# Patient Record
Sex: Male | Born: 1940
Health system: Southern US, Community
[De-identification: ages and names within clinical notes are randomized; demographics above are authoritative.]

## PROBLEM LIST (undated history)

## (undated) DIAGNOSIS — H9191 Unspecified hearing loss, right ear: Secondary | ICD-10-CM

## (undated) DIAGNOSIS — N200 Calculus of kidney: Secondary | ICD-10-CM

## (undated) DIAGNOSIS — E785 Hyperlipidemia, unspecified: Secondary | ICD-10-CM

## (undated) DIAGNOSIS — I10 Essential (primary) hypertension: Secondary | ICD-10-CM

## (undated) DIAGNOSIS — E7800 Pure hypercholesterolemia, unspecified: Secondary | ICD-10-CM

## (undated) HISTORY — PX: APPENDECTOMY: SHX54

## (undated) HISTORY — PX: DENTAL SURGERY: SHX609

## (undated) HISTORY — PX: CYST EXCISION: SHX5701

## (undated) HISTORY — PX: CYSTOSCOPY: SUR368

---
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
## 2009-07-03 ENCOUNTER — Encounter: Admission: RE | Admit: 2009-07-03 | Discharge: 2009-07-03 | Payer: Self-pay | Admitting: Otolaryngology

## 2010-04-04 ENCOUNTER — Encounter: Payer: Self-pay | Admitting: Otolaryngology

## 2014-12-17 DIAGNOSIS — L820 Inflamed seborrheic keratosis: Secondary | ICD-10-CM | POA: Diagnosis not present

## 2014-12-17 DIAGNOSIS — B078 Other viral warts: Secondary | ICD-10-CM | POA: Diagnosis not present

## 2014-12-17 DIAGNOSIS — L821 Other seborrheic keratosis: Secondary | ICD-10-CM | POA: Diagnosis not present

## 2014-12-17 DIAGNOSIS — D485 Neoplasm of uncertain behavior of skin: Secondary | ICD-10-CM | POA: Diagnosis not present

## 2014-12-17 DIAGNOSIS — D239 Other benign neoplasm of skin, unspecified: Secondary | ICD-10-CM | POA: Diagnosis not present

## 2015-01-05 DIAGNOSIS — Z23 Encounter for immunization: Secondary | ICD-10-CM | POA: Diagnosis not present

## 2015-04-09 DIAGNOSIS — B349 Viral infection, unspecified: Secondary | ICD-10-CM | POA: Diagnosis not present

## 2015-04-09 DIAGNOSIS — R0982 Postnasal drip: Secondary | ICD-10-CM | POA: Diagnosis not present

## 2015-04-14 DIAGNOSIS — J310 Chronic rhinitis: Secondary | ICD-10-CM | POA: Diagnosis not present

## 2015-04-14 DIAGNOSIS — J069 Acute upper respiratory infection, unspecified: Secondary | ICD-10-CM | POA: Diagnosis not present

## 2015-04-14 DIAGNOSIS — R05 Cough: Secondary | ICD-10-CM | POA: Diagnosis not present

## 2015-04-22 DIAGNOSIS — Z0000 Encounter for general adult medical examination without abnormal findings: Secondary | ICD-10-CM | POA: Diagnosis not present

## 2015-04-22 DIAGNOSIS — N200 Calculus of kidney: Secondary | ICD-10-CM | POA: Diagnosis not present

## 2015-04-22 DIAGNOSIS — I10 Essential (primary) hypertension: Secondary | ICD-10-CM | POA: Diagnosis not present

## 2015-04-22 DIAGNOSIS — E782 Mixed hyperlipidemia: Secondary | ICD-10-CM | POA: Diagnosis not present

## 2015-05-01 DIAGNOSIS — H6122 Impacted cerumen, left ear: Secondary | ICD-10-CM | POA: Diagnosis not present

## 2015-05-01 DIAGNOSIS — N200 Calculus of kidney: Secondary | ICD-10-CM | POA: Diagnosis not present

## 2015-05-01 DIAGNOSIS — E782 Mixed hyperlipidemia: Secondary | ICD-10-CM | POA: Diagnosis not present

## 2015-05-01 DIAGNOSIS — I10 Essential (primary) hypertension: Secondary | ICD-10-CM | POA: Diagnosis not present

## 2015-05-29 DIAGNOSIS — E782 Mixed hyperlipidemia: Secondary | ICD-10-CM | POA: Diagnosis not present

## 2015-05-29 DIAGNOSIS — I10 Essential (primary) hypertension: Secondary | ICD-10-CM | POA: Diagnosis not present

## 2015-05-29 DIAGNOSIS — R413 Other amnesia: Secondary | ICD-10-CM | POA: Diagnosis not present

## 2015-11-12 DIAGNOSIS — Z23 Encounter for immunization: Secondary | ICD-10-CM | POA: Diagnosis not present

## 2015-12-08 DIAGNOSIS — H2513 Age-related nuclear cataract, bilateral: Secondary | ICD-10-CM | POA: Diagnosis not present

## 2015-12-29 DIAGNOSIS — E782 Mixed hyperlipidemia: Secondary | ICD-10-CM | POA: Diagnosis not present

## 2015-12-29 DIAGNOSIS — N200 Calculus of kidney: Secondary | ICD-10-CM | POA: Diagnosis not present

## 2015-12-29 DIAGNOSIS — I10 Essential (primary) hypertension: Secondary | ICD-10-CM | POA: Diagnosis not present

## 2015-12-29 DIAGNOSIS — R55 Syncope and collapse: Secondary | ICD-10-CM | POA: Diagnosis not present

## 2016-01-01 DIAGNOSIS — I10 Essential (primary) hypertension: Secondary | ICD-10-CM | POA: Diagnosis not present

## 2016-01-01 DIAGNOSIS — R55 Syncope and collapse: Secondary | ICD-10-CM | POA: Diagnosis not present

## 2016-01-01 DIAGNOSIS — E782 Mixed hyperlipidemia: Secondary | ICD-10-CM | POA: Diagnosis not present

## 2016-01-08 DIAGNOSIS — E782 Mixed hyperlipidemia: Secondary | ICD-10-CM | POA: Diagnosis not present

## 2016-01-08 DIAGNOSIS — N401 Enlarged prostate with lower urinary tract symptoms: Secondary | ICD-10-CM | POA: Diagnosis not present

## 2016-01-08 DIAGNOSIS — M542 Cervicalgia: Secondary | ICD-10-CM | POA: Diagnosis not present

## 2016-01-08 DIAGNOSIS — M545 Low back pain: Secondary | ICD-10-CM | POA: Diagnosis not present

## 2016-06-24 DIAGNOSIS — N200 Calculus of kidney: Secondary | ICD-10-CM | POA: Diagnosis not present

## 2016-06-24 DIAGNOSIS — I10 Essential (primary) hypertension: Secondary | ICD-10-CM | POA: Diagnosis not present

## 2016-06-24 DIAGNOSIS — Z0000 Encounter for general adult medical examination without abnormal findings: Secondary | ICD-10-CM | POA: Diagnosis not present

## 2016-06-24 DIAGNOSIS — E782 Mixed hyperlipidemia: Secondary | ICD-10-CM | POA: Diagnosis not present

## 2016-06-24 DIAGNOSIS — Z125 Encounter for screening for malignant neoplasm of prostate: Secondary | ICD-10-CM | POA: Diagnosis not present

## 2016-07-01 DIAGNOSIS — B370 Candidal stomatitis: Secondary | ICD-10-CM | POA: Diagnosis not present

## 2016-07-01 DIAGNOSIS — E782 Mixed hyperlipidemia: Secondary | ICD-10-CM | POA: Diagnosis not present

## 2016-07-01 DIAGNOSIS — N401 Enlarged prostate with lower urinary tract symptoms: Secondary | ICD-10-CM | POA: Diagnosis not present

## 2016-07-01 DIAGNOSIS — Z23 Encounter for immunization: Secondary | ICD-10-CM | POA: Diagnosis not present

## 2016-07-01 DIAGNOSIS — N200 Calculus of kidney: Secondary | ICD-10-CM | POA: Diagnosis not present

## 2016-08-09 DIAGNOSIS — D126 Benign neoplasm of colon, unspecified: Secondary | ICD-10-CM | POA: Diagnosis not present

## 2016-08-09 DIAGNOSIS — K5730 Diverticulosis of large intestine without perforation or abscess without bleeding: Secondary | ICD-10-CM | POA: Diagnosis not present

## 2016-08-09 DIAGNOSIS — Z86010 Personal history of colonic polyps: Secondary | ICD-10-CM | POA: Diagnosis not present

## 2016-08-11 DIAGNOSIS — D126 Benign neoplasm of colon, unspecified: Secondary | ICD-10-CM | POA: Diagnosis not present

## 2016-12-09 DIAGNOSIS — Z23 Encounter for immunization: Secondary | ICD-10-CM | POA: Diagnosis not present

## 2017-01-23 DIAGNOSIS — I10 Essential (primary) hypertension: Secondary | ICD-10-CM | POA: Diagnosis not present

## 2017-01-23 DIAGNOSIS — E782 Mixed hyperlipidemia: Secondary | ICD-10-CM | POA: Diagnosis not present

## 2017-01-30 DIAGNOSIS — I10 Essential (primary) hypertension: Secondary | ICD-10-CM | POA: Diagnosis not present

## 2017-01-30 DIAGNOSIS — E782 Mixed hyperlipidemia: Secondary | ICD-10-CM | POA: Diagnosis not present

## 2017-01-30 DIAGNOSIS — N200 Calculus of kidney: Secondary | ICD-10-CM | POA: Diagnosis not present

## 2017-01-30 DIAGNOSIS — N401 Enlarged prostate with lower urinary tract symptoms: Secondary | ICD-10-CM | POA: Diagnosis not present

## 2017-02-26 ENCOUNTER — Encounter (HOSPITAL_COMMUNITY): Payer: Self-pay | Admitting: *Deleted

## 2017-02-26 ENCOUNTER — Inpatient Hospital Stay (HOSPITAL_COMMUNITY)
Admission: EM | Admit: 2017-02-26 | Discharge: 2017-03-01 | DRG: 603 | Disposition: A | Discharge status: Home / Self Care | Payer: Medicare HMO | Attending: Internal Medicine | Admitting: Internal Medicine

## 2017-02-26 ENCOUNTER — Other Ambulatory Visit: Payer: Self-pay

## 2017-02-26 ENCOUNTER — Emergency Department (HOSPITAL_COMMUNITY): Payer: Medicare HMO

## 2017-02-26 DIAGNOSIS — L03113 Cellulitis of right upper limb: Principal | ICD-10-CM | POA: Diagnosis present

## 2017-02-26 DIAGNOSIS — Z79899 Other long term (current) drug therapy: Secondary | ICD-10-CM | POA: Diagnosis not present

## 2017-02-26 DIAGNOSIS — R197 Diarrhea, unspecified: Secondary | ICD-10-CM | POA: Diagnosis not present

## 2017-02-26 DIAGNOSIS — W5501XA Bitten by cat, initial encounter: Secondary | ICD-10-CM

## 2017-02-26 DIAGNOSIS — L0390 Cellulitis, unspecified: Secondary | ICD-10-CM | POA: Diagnosis present

## 2017-02-26 DIAGNOSIS — D696 Thrombocytopenia, unspecified: Secondary | ICD-10-CM | POA: Diagnosis present

## 2017-02-26 DIAGNOSIS — I10 Essential (primary) hypertension: Secondary | ICD-10-CM | POA: Diagnosis not present

## 2017-02-26 DIAGNOSIS — E876 Hypokalemia: Secondary | ICD-10-CM | POA: Diagnosis present

## 2017-02-26 DIAGNOSIS — Z23 Encounter for immunization: Secondary | ICD-10-CM | POA: Diagnosis not present

## 2017-02-26 DIAGNOSIS — S61431A Puncture wound without foreign body of right hand, initial encounter: Secondary | ICD-10-CM | POA: Diagnosis not present

## 2017-02-26 DIAGNOSIS — S61451A Open bite of right hand, initial encounter: Secondary | ICD-10-CM | POA: Diagnosis not present

## 2017-02-26 DIAGNOSIS — E7800 Pure hypercholesterolemia, unspecified: Secondary | ICD-10-CM | POA: Diagnosis present

## 2017-02-26 DIAGNOSIS — S61401A Unspecified open wound of right hand, initial encounter: Secondary | ICD-10-CM | POA: Diagnosis not present

## 2017-02-26 DIAGNOSIS — Z792 Long term (current) use of antibiotics: Secondary | ICD-10-CM | POA: Diagnosis not present

## 2017-02-26 HISTORY — DX: Essential (primary) hypertension: I10

## 2017-02-26 HISTORY — DX: Pure hypercholesterolemia, unspecified: E78.00

## 2017-02-26 LAB — CBC WITH DIFFERENTIAL/PLATELET
BASOS ABS: 0 10*3/uL (ref 0.0–0.1)
BASOS PCT: 0 %
EOS ABS: 0.1 10*3/uL (ref 0.0–0.7)
EOS PCT: 1 %
HCT: 44.3 % (ref 39.0–52.0)
Hemoglobin: 15.4 g/dL (ref 13.0–17.0)
Lymphocytes Relative: 29 %
Lymphs Abs: 3.1 10*3/uL (ref 0.7–4.0)
MCH: 33.1 pg (ref 26.0–34.0)
MCHC: 34.8 g/dL (ref 30.0–36.0)
MCV: 95.3 fL (ref 78.0–100.0)
MONO ABS: 0.6 10*3/uL (ref 0.1–1.0)
MONOS PCT: 6 %
Neutro Abs: 7 10*3/uL (ref 1.7–7.7)
Neutrophils Relative %: 64 %
PLATELETS: 124 10*3/uL — AB (ref 150–400)
RBC: 4.65 MIL/uL (ref 4.22–5.81)
RDW: 13.9 % (ref 11.5–15.5)
WBC: 10.8 10*3/uL — ABNORMAL HIGH (ref 4.0–10.5)

## 2017-02-26 LAB — COMPREHENSIVE METABOLIC PANEL
ALBUMIN: 3.6 g/dL (ref 3.5–5.0)
ALK PHOS: 52 U/L (ref 38–126)
ALT: 29 U/L (ref 17–63)
ANION GAP: 10 (ref 5–15)
AST: 33 U/L (ref 15–41)
BILIRUBIN TOTAL: 0.5 mg/dL (ref 0.3–1.2)
BUN: 11 mg/dL (ref 6–20)
CALCIUM: 8.8 mg/dL — AB (ref 8.9–10.3)
CO2: 21 mmol/L — AB (ref 22–32)
CREATININE: 0.91 mg/dL (ref 0.61–1.24)
Chloride: 106 mmol/L (ref 101–111)
GFR calc Af Amer: >60 mL/min (ref >60)
GFR calc non Af Amer: >60 mL/min (ref >60)
GLUCOSE: 142 mg/dL — AB (ref 65–99)
Potassium: 3.2 mmol/L — ABNORMAL LOW (ref 3.5–5.1)
SODIUM: 137 mmol/L (ref 135–145)
TOTAL PROTEIN: 5.7 g/dL — AB (ref 6.5–8.1)

## 2017-02-26 LAB — I-STAT CG4 LACTIC ACID, ED: Lactic Acid, Venous: 1.36 mmol/L (ref 0.5–1.9)

## 2017-02-26 MED ORDER — POTASSIUM CHLORIDE 20 MEQ/15ML (10%) PO SOLN
40.0000 meq | Freq: Once | ORAL | Status: DC
  Filled 2017-02-26: qty 30

## 2017-02-26 MED ORDER — TETANUS-DIPHTH-ACELL PERTUSSIS 5-2.5-18.5 LF-MCG/0.5 IM SUSP
0.5000 mL | Freq: Once | INTRAMUSCULAR | Status: AC
  Administered 2017-02-27: 0.5 mL via INTRAMUSCULAR
  Filled 2017-02-26: qty 0.5

## 2017-02-26 MED ORDER — PIPERACILLIN-TAZOBACTAM 3.375 G IVPB 30 MIN
3.3750 g | Freq: Once | INTRAVENOUS | Status: AC
  Administered 2017-02-27: 3.375 g via INTRAVENOUS
  Filled 2017-02-26: qty 50

## 2017-02-26 MED ORDER — VANCOMYCIN HCL IN DEXTROSE 1-5 GM/200ML-% IV SOLN
1000.0000 mg | Freq: Once | INTRAVENOUS | Status: AC
  Administered 2017-02-27: 1000 mg via INTRAVENOUS
  Filled 2017-02-26: qty 200

## 2017-02-26 NOTE — ED Triage Notes (Signed)
Pt reports cat bite to right hand two days ago. Had redness to hand and [facility] to [facility] this [facility]. Was given ceftriaxaone 250mg  IM and given prescription. Reports redness has since increased and spread further down right hand since this afternoon.

## 2017-02-26 NOTE — Progress Notes (Addendum)
Pharmacy Antibiotic Note  William Black is a 76 y.o. male admitted on 02/26/2017 with persistent wound infection from cat bite.  Pharmacy has been consulted for vancomycin and zosyn dosing.  WBC 10.8, LA 1.36, and afebrile. SCr 0.91 for estimated CrCl ~ 65 mL/min.   Vancomcyin 1g IV x1 and Zosyn 3.375g IV x1 given in ED.   Plan: Vancomycin 750mg  IV q12hr  Zosyn 3.375g IV q8hr  Vancomycin trough at SS and PRN (goal ~ 15 mcg/mL) Monitor renal function, clinical picture, and culture data  F/u de-escalation and length of therapy   Height: 5\' 8"  (172.7 cm) Weight: 143 lb (64.9 kg) IBW/kg (Calculated) : 68.4  Temp (24hrs), Avg:98 F (36.7 C), Min:98 F (36.7 C), Max:98 F (36.7 C)  Recent Labs  Lab 02/26/17 1909 02/26/17 1923  WBC 10.8*  --   CREATININE 0.91  --   LATICACIDVEN  --  1.36    Estimated Creatinine Clearance: 65.4 mL/min (by C-G formula based on SCr of 0.91 mg/dL).    No Known Allergies  Antimicrobials this admission: 12/16 Vanc >>  12/16 Zosyn >>   Lavonda Jumbo, PharmD Clinical Pharmacist 02/26/17 11:18 PM

## 2017-02-26 NOTE — H&P (Addendum)
History and Physical   William Black LOV:[id number] DOB: [birth year] DOA: 02/26/2017  PCP: [name], MD  Chief Complaint: cat bite  HPI:  This is a 76 year old current smoker and history of hypertension presenting to emergency department with right hand pain. He reports 2-3 days ago his cat bit his right dorsal hand in the morning. He reports no symptoms until the morning of admission where he noticed he was having some pain in his right hand, progressively throughout the day it became more red and painful. He sought care local urgent care where he received IM ceftriaxone and given a prescription for Augmentin. He denies any fevers, chills, shortness of breath, chest pain, nausea, vomiting.  He reports his cat is an Occupational psychologist. He reports that at the request of his daughter and spouse who concern that the redness was becoming worse, he sought care in emergency room. He currently reports no impairment of his range of motion, current pain 5 out of 10, cites pain is worse when the blood pressure cuff inflates more proximally to his arm.  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  ED Course: In emergency department vital signs were remarkable for systolic blood pressures 884Z, normal heart rate. Maintaining O2 sats above 90% on room air. BMP remarkable for hypokalemia to 3.2 and white count 10.8.  Plain film of the right hand found generalized mild diffuse soft tissue swelling without underlying emphysema, no frank obstruction. The emergency medicine team spoke with hand surgery who recommended broad-spectrum antimicrobial coverage and nothing by mouth status in anticipation for evaluation in the a.m. to see if surgery would be indicated. Hospital medicine team consulted for admission.   Review of Systems: A complete ROS was  obtained; pertinent positives negatives are denoted in the HPI. Otherwise, all systems are negative.   Past Medical History:  Diagnosis Date  . High cholesterol   . Hypertension    Social History   Socioeconomic History  . Marital status: Married    Spouse name: Not on file  . Number of children: Not on file  . Years of education: Not on file  . Highest education level: Not on file  Social Needs  . Financial resource strain: Not on file  . Food insecurity - worry: Not on file  . Food insecurity - inability: Not on file  . Transportation needs - medical: Not on file  . Transportation needs - non-medical: Not on file  Occupational History  . Not on file  Tobacco Use  . Smoking status: Never Smoker  Substance and Sexual Activity  . Alcohol use: No    Frequency: Never  . Drug use: No  . Sexual activity: Not on file  Other Topics Concern  . Not on file  Social History Narrative  . Not on file   Family hx: -Father died of cancer, mother died of old age at age 49 years old  Physical Exam: Vitals:   02/26/17 2200 02/26/17 2215 02/26/17 2245 02/26/17 2300  BP: (!) 143/86 133/83 140/78 (!) 149/83  Pulse: 73 69  75  Resp:      Temp:      TempSrc:      SpO2: 97% 96%  94%  Weight:      Height:       General: Appears calm and comfortable. White man, pleasant and cooperative. ENT:  Grossly normal hearing, MMM. Cardiovascular: RRR. No M/R/G. No LE edema.  Respiratory: CTA bilaterally. No wheezes or crackles. Normal respiratory effort.  Breathing room air. Abdomen: Soft, non-tender. Bowel sounds present.  Skin: right dorsum of hand with erythema, tenderness, no frank fluctuance appreciated.   Erythematous streaking present moving up forearm. Musculoskeletal: Grossly normal tone BUE/BLE. Appropriate ROM in right wriest and fingers. Psychiatric: Grossly normal mood and affect. Neurologic: Moves all extremities in coordinated fashion.  I have personally reviewed the following  labs, culture data, and imaging studies.  Assessment/Plan:  Right hand cellulitis following cat bite No signs of sepsis, ROM appears to be intact.  XR without gas or bony destruction.  Emergency medicine team discussed case with hand surgery team who recommended broad antimicrobial coverage and evaluation in AM for possible surgical intervention. Plan: *will continue with broad spectrum antimicrobial coverage with vancomycin + piperacillin-tazobactam given patient's report of wound worsening after receiving ceftriaxone IM earlier in day  *NPO *hand surgery team to evaluate in the AM  Other problems -Smoking: recommend smoking cessation counseling -HTN / elevated cardiovascular risk: continue CCB + statin, hold ACEi in the event surgery planned -Hypokalemia: K of 3.2 on admission, will replace PO and re-check with AM labs  DVT prophylaxis: SCDs Code Status: full code after discussion on admission Disposition Plan: Anticipate D/C home in 2-5 days Consults called: hand surgery team Admission status: admit to hospital medicine team, [facility] campus   Cheri Rous, MD [facility] [phone]  If 7PM-7AM, please contact night-coverage [email or web address] Password [email or web address]

## 2017-02-26 NOTE — ED Provider Notes (Signed)
[place] EMERGENCY DEPARTMENT Provider Note   CSN: [id number] Arrival date & time: 02/26/17  1836     History   Chief Complaint Chief Complaint  Patient presents with  . Animal Bite    HPI William Black is a 76 y.o. male.  HPI 76 year old male past medical history significant for hypertension presents to the emergency department today with complaints of redness and swelling along with pain to his right hand after a cat bite 2 days ago.  Patient states that his cat bit him 2 days ago.  States he woke up this morning with some pain and redness around the puncture wound today.  States it was minimal this morning.  Went to urgent care after lunch and they gave him IM Rocephin and sent home on Augmentin.  Patient states that within the past 4 hours the redness has significantly worsened and started streaking up his arm.  Also states that the swelling has worsened.  Reports pain with flexion of his fingers.  Patient denies any associated fevers, chills, nausea, vomiting.  Has not taken anything for the pain prior to arrival.  Unsure last tetanus shot.  States that cat's rabies vaccinations are up-to-date.  Pt denies any fever, chill, ha, vision changes, lightheadedness, dizziness, congestion, neck pain, cp, sob, cough, abd pain, n/v/d, urinary symptoms, change in bowel habits, melena, hematochezia, paresthesias or weakness in the upper extremities.  Past Medical History:  Diagnosis Date  . High cholesterol   . Hypertension     There are no active problems to display for this patient.   History reviewed. No pertinent surgical history.     Home Medications    Prior to Admission medications   Medication Sig Start Date End Date Taking? Authorizing Provider  amoxicillin-clavulanate (AUGMENTIN) 875-125 MG tablet Take 1 tablet by mouth daily. 02/26/17  Yes [provider]  atorvastatin (LIPITOR) 20 MG tablet Take 20 mg by mouth at bedtime.  02/15/17  Yes  [provider]  diltiazem (CARDIZEM CD) 360 MG 24 hr capsule Take 360 mg by mouth daily. 02/15/17  Yes [provider]  lisinopril (PRINIVIL,ZESTRIL) 40 MG tablet Take 40 mg by mouth 2 (two) times daily. 02/15/17  Yes [provider]    Family History History reviewed. No pertinent family history.  Social History Social History   Tobacco Use  . Smoking status: Never Smoker  Substance Use Topics  . Alcohol use: No    Frequency: Never  . Drug use: No     Allergies   Patient has no known allergies.   Review of Systems Review of Systems  Constitutional: Negative for chills and fever.  HENT: Negative for congestion and sore throat.   Eyes: Negative for visual disturbance.  Respiratory: Negative for cough and shortness of breath.   Cardiovascular: Negative for chest pain.  Gastrointestinal: Negative for abdominal pain, diarrhea, nausea and vomiting.  Genitourinary: Negative for dysuria, flank pain, frequency, hematuria and urgency.  Musculoskeletal: Positive for arthralgias, joint swelling and myalgias.  Skin: Positive for color change and wound. Negative for rash.  Neurological: Negative for dizziness, syncope, weakness, light-headedness, numbness and headaches.  Psychiatric/Behavioral: Negative for sleep disturbance. The patient is not nervous/anxious.      Physical Exam Updated Vital Signs BP (!) 143/86   Pulse 73   Temp 98 F (36.7 C) (Oral)   Resp 18   Ht 5\' 8"  (1.727 m)   Wt 64.9 kg (143 lb)   SpO2 97%   BMI  21.74 kg/m   Physical Exam  Constitutional: He appears well-developed and well-nourished. No distress.  HENT:  Head: Normocephalic and atraumatic.  Eyes: Right eye exhibits no discharge. Left eye exhibits no discharge. No scleral icterus.  Neck: Normal range of motion.  Cardiovascular: Normal rate, regular rhythm, normal heart sounds and intact distal pulses.  Pulmonary/Chest: Effort normal. No respiratory distress.    Musculoskeletal: Normal range of motion.  Brisk cap refill.  Radial pulses 2+ bilaterally.  Sensation intact.  Neurological: He is alert.  Skin: Skin is warm and dry. Capillary refill takes less than 2 seconds. No pallor.  Patient with a puncture wound to the dorsum of the right hand.  He does have a significant amount of erythema and warmth surrounding the dorsum of the left hand over the puncture wound.  The redness streaks up his right arm.  He also has pain with range of motion of his right hand with flexion and extension of the phalanges.  There is no specific crepitus or fluctuance noted.  Purulent drainage noted.  Patient has full range of motion of the right wrist and right elbow without pain.  Psychiatric: His behavior is normal. Judgment and thought content normal.  Nursing note and vitals reviewed.        ED Treatments / Results  Labs (all labs ordered are listed, but only abnormal results are displayed) Labs Reviewed  COMPREHENSIVE METABOLIC PANEL - Abnormal; Notable for the following components:      Result Value   Potassium 3.2 (*)    CO2 21 (*)    Glucose, Bld 142 (*)    Calcium 8.8 (*)    Total Protein 5.7 (*)    All other components within normal limits  CBC WITH DIFFERENTIAL/PLATELET - Abnormal; Notable for the following components:   WBC 10.8 (*)    Platelets 124 (*)    All other components within normal limits  I-STAT CG4 LACTIC ACID, ED  I-STAT CG4 LACTIC ACID, ED    EKG  EKG Interpretation None       Radiology No results found.  Procedures Procedures (including critical care time)  Medications Ordered in ED Medications - No data to display   Initial Impression / Assessment and Plan / ED Course  I have reviewed the triage vital signs and the nursing notes.  Pertinent labs & imaging results that were available during my care of the patient were reviewed by me and considered in my medical decision making (see chart for details).      Patient presents to the ED with a cat bite to the right hand that he sustained 2 days ago.  Patient states that the erythema, edema and pain has acutely worsened over the past 24 hours.  Reports streaking up his right arm.  Patient seen at urgent care today and given IM Rocephin.  Started on Augmentin which she is not taking it.    Overall patient is well-appearing and nontoxic.  Vital signs are reassuring.  Patient is afebrile.  No tachycardia or hypotension noted.  Patient does not meet Sirs or sepsis criteria.  On exam patient has erythema, warmth and edema to the dorsum of the right hand.  There is no purulent drainage.  Single puncture wound is noted.  The erythema streaks up the right arm.  Patient does have range of motion is neurovascularly intact.  He does have pain with range of motion of his right fingers.  Denies any pain with range of motion of the  right wrist or the right elbow.  Patient has no leukocytosis.  Mild hypokalemia at 3.2 which will replace with oral potassium.  Lactic acid is normal.  X-ray will be obtained to rule any subcu gas concerning for necrotizing fasciitis.  So evaluate for any retained foreign bodies.  Will consult with hand surgery.  Patient will likely need admission for IV antibiotics and observation.  Will await orthopedics recommendations.  X-ray returned that shows no acute abnormalities.  Does note radiopaque foreign body at the tip of the middle finger that does not seem to be associated with the injury. Tetanus updated in the ED.  Spoke with Dr. Percell Miller with hand surgery.Recommends hospital admission with n.p.o. after midnight and broad-spectrum antibiotics.  Patient updated on plan of care is hemodynamic stable this time.  Spoke with Dr. Stana Bunting with hospital medicine who agrees to admission and will see patient in the ED and place admission orders.      Final Clinical Impressions(s) / ED Diagnoses   Final diagnoses:  Cat bite, initial encounter   Cellulitis of right upper extremity    ED Discharge Orders    None       Aaron Edelman 02/26/17 2326    Sherwood Gambler, MD 02/27/17 [phone]

## 2017-02-26 NOTE — ED Notes (Signed)
Internal medicine at bedside

## 2017-02-27 ENCOUNTER — Encounter (HOSPITAL_COMMUNITY): Payer: Self-pay | Admitting: *Deleted

## 2017-02-27 LAB — CBC
HCT: 41.9 % (ref 39.0–52.0)
HEMOGLOBIN: 15 g/dL (ref 13.0–17.0)
MCH: 33.9 pg (ref 26.0–34.0)
MCHC: 35.8 g/dL (ref 30.0–36.0)
MCV: 94.6 fL (ref 78.0–100.0)
Platelets: 124 10*3/uL — ABNORMAL LOW (ref 150–400)
RBC: 4.43 MIL/uL (ref 4.22–5.81)
RDW: 14.1 % (ref 11.5–15.5)
WBC: 9.6 10*3/uL (ref 4.0–10.5)

## 2017-02-27 LAB — BASIC METABOLIC PANEL
ANION GAP: 8 (ref 5–15)
BUN: 8 mg/dL (ref 6–20)
CHLORIDE: 107 mmol/L (ref 101–111)
CO2: 24 mmol/L (ref 22–32)
Calcium: 8.4 mg/dL — ABNORMAL LOW (ref 8.9–10.3)
Creatinine, Ser: 0.96 mg/dL (ref 0.61–1.24)
GFR calc non Af Amer: >60 mL/min (ref >60)
Glucose, Bld: 118 mg/dL — ABNORMAL HIGH (ref 65–99)
Potassium: 3.2 mmol/L — ABNORMAL LOW (ref 3.5–5.1)
Sodium: 139 mmol/L (ref 135–145)

## 2017-02-27 LAB — I-STAT CG4 LACTIC ACID, ED: LACTIC ACID, VENOUS: 1.83 mmol/L (ref 0.5–1.9)

## 2017-02-27 LAB — PROTIME-INR
INR: 1.06
Prothrombin Time: 13.7 seconds (ref 11.4–15.2)

## 2017-02-27 MED ORDER — ATORVASTATIN CALCIUM 20 MG PO TABS
20.0000 mg | ORAL_TABLET | Freq: Every day | ORAL | Status: DC
  Administered 2017-02-27 – 2017-02-28 (×3): 20 mg via ORAL
  Filled 2017-02-27 (×3): qty 1

## 2017-02-27 MED ORDER — SODIUM CHLORIDE 0.9% FLUSH
3.0000 mL | Freq: Two times a day (BID) | INTRAVENOUS | Status: DC
  Administered 2017-02-27 – 2017-02-28 (×3): 3 mL via INTRAVENOUS

## 2017-02-27 MED ORDER — POTASSIUM CHLORIDE 10 MEQ/100ML IV SOLN: 10.0000 meq | INTRAVENOUS | Status: DC

## 2017-02-27 MED ORDER — SODIUM CHLORIDE 0.9 % IV SOLN
3.0000 g | Freq: Four times a day (QID) | INTRAVENOUS | Status: DC
  Administered 2017-02-27 – 2017-02-28 (×5): 3 g via INTRAVENOUS
  Filled 2017-02-27 (×6): qty 3

## 2017-02-27 MED ORDER — POTASSIUM CHLORIDE 20 MEQ/15ML (10%) PO SOLN
40.0000 meq | Freq: Once | ORAL | Status: AC
  Administered 2017-02-27: 40 meq via ORAL
  Filled 2017-02-27: qty 30

## 2017-02-27 MED ORDER — PIPERACILLIN-TAZOBACTAM 3.375 G IVPB
3.3750 g | Freq: Three times a day (TID) | INTRAVENOUS | Status: DC
  Administered 2017-02-27: 3.375 g via INTRAVENOUS
  Filled 2017-02-27 (×2): qty 50

## 2017-02-27 MED ORDER — VANCOMYCIN HCL IN DEXTROSE 750-5 MG/150ML-% IV SOLN
750.0000 mg | Freq: Two times a day (BID) | INTRAVENOUS | Status: DC
Start: 2017-02-27 — End: 2017-02-27
  Filled 2017-02-27: qty 150

## 2017-02-27 MED ORDER — DILTIAZEM HCL ER COATED BEADS 360 MG PO CP24
360.0000 mg | ORAL_CAPSULE | Freq: Every day | ORAL | Status: DC
  Administered 2017-02-27: 360 mg via ORAL
  Filled 2017-02-27: qty 2
  Filled 2017-02-27 (×2): qty 1
  Filled 2017-02-27: qty 2

## 2017-02-27 NOTE — Progress Notes (Signed)
Patient is on antibiotics, has been c/o diarrhea this afternoon. Loss stool consistency started around 1:30pm, yellow in color, no odor, and small/medium in size. Patient is not c/o abdominal pain or tenderness. No fever indicated. Dr. Charlies Silvers notified at 5:07pm total 3 loss stools within the last 24 hours, waiting for response. Will continue to monitor

## 2017-02-27 NOTE — ED Notes (Signed)
Stana Bunting, MD paged about potassium suspension and patient's NPO status.

## 2017-02-27 NOTE — Progress Notes (Signed)
Patient ID: William Black, male   DOB: February 12, 1941, 76 y.o.   MRN: [id number]  PROGRESS NOTE    William Black  JIR:[id number] DOB: 12-06-1940 DOA: 02/26/2017  PCP: [name], MD   Brief Narrative:   76 year old male with history of hypertension who presented to ED with worsening swelling and redness of the right hand after he had a cat bite about few days prior to the admission. Patient was started on empiric vancomycin and Zosyn while awaiting culture results. Orthopedic surgery consulted.   Assessment & Plan:   Active Problems: Right hand cellulitis / Leukocytosis  - Right hand x ray showed 1. 2 x 1 mm radiopaque foreign body adjacent to the tuft of the right middle finger. Generalized soft tissue edema of the hand and wrist without acute osseous abnormality. - Plan for possible I&D today, will follow up with ortho recommendations  - Continue vanco and zosyn - Follow up culture results   Hypokalemia  - Supplemented - Follow up BMP in am  Essential hypertension - Continue Cardizem     DVT prophylaxis: SCD's Code Status: full code  Family Communication: no family at the bedside this am Disposition Plan: anticipate d/c once cleared by ortho    Consultants:   Orthopedic surgery   Procedures:   None  Antimicrobials:   Vanco 02/26/2017 -->  Zosyn 02/27/2017 -->   Subjective: No overnight events.   Objective: Vitals:   02/27/17 0030 02/27/17 0114 02/27/17 0140 02/27/17 0518  BP: 134/79 136/81 (!) 144/74 (!) 151/76  Pulse: 75 76 73 65  Resp:    18  Temp:   98.7 F (37.1 C) (!) 97.5 F (36.4 C)  TempSrc:   Oral Oral  SpO2: 94% 94% 97% 97%  Weight:      Height:       No intake or output data in the 24 hours ending 02/27/17 0920 Filed Weights   02/26/17 1844  Weight: 64.9 kg (143 lb)    Examination:  General exam: Appears calm and comfortable  Respiratory system: Clear to auscultation. Respiratory effort normal. Cardiovascular system:  S1 & S2 heard, RRR.  Gastrointestinal system: Abdomen is nondistended, soft and nontender. No organomegaly or masses felt. Normal bowel sounds heard. Central nervous system: Alert and oriented. No focal neurological deficits. Extremities: Symmetric 5 x 5 power. Skin: right hand swollen and erythematous, warm to touch, palpable pulses  Psychiatry: Judgement and insight appear normal. Mood & affect appropriate.   Data Reviewed: I have personally reviewed following labs and imaging studies  CBC: Recent Labs  Lab 02/26/17 1909 02/27/17 0214  WBC 10.8* 9.6  NEUTROABS 7.0  --   HGB 15.4 15.0  HCT 44.3 41.9  MCV 95.3 94.6  PLT 124* 751*   Basic Metabolic Panel: Recent Labs  Lab 02/26/17 1909 02/27/17 0214  NA 137 139  K 3.2* 3.2*  CL 106 107  CO2 21* 24  GLUCOSE 142* 118*  BUN 11 8  CREATININE 0.91 0.96  CALCIUM 8.8* 8.4*   GFR: Estimated Creatinine Clearance: 62 mL/min (by C-G formula based on SCr of 0.96 mg/dL). Liver Function Tests: Recent Labs  Lab 02/26/17 1909  AST 33  ALT 29  ALKPHOS 52  BILITOT 0.5  PROT 5.7*  ALBUMIN 3.6   No results for input(s): LIPASE, AMYLASE in the last 168 hours. No results for input(s): AMMONIA in the last 168 hours. Coagulation Profile: Recent Labs  Lab 02/27/17 0214  INR 1.06   Cardiac Enzymes: No results  for input(s): CKTOTAL, CKMB, CKMBINDEX, TROPONINI in the last 168 hours. BNP (last 3 results) No results for input(s): PROBNP in the last 8760 hours. HbA1C: No results for input(s): HGBA1C in the last 72 hours. CBG: No results for input(s): GLUCAP in the last 168 hours. Lipid Profile: No results for input(s): CHOL, HDL, LDLCALC, TRIG, CHOLHDL, LDLDIRECT in the last 72 hours. Thyroid Function Tests: No results for input(s): TSH, T4TOTAL, FREET4, T3FREE, THYROIDAB in the last 72 hours. Anemia Panel: No results for input(s): VITAMINB12, FOLATE, FERRITIN, TIBC, IRON, RETICCTPCT in the last 72 hours. Urine analysis: No  results found for: COLORURINE, APPEARANCEUR, LABSPEC, PHURINE, GLUCOSEU, HGBUR, BILIRUBINUR, KETONESUR, PROTEINUR, UROBILINOGEN, NITRITE, LEUKOCYTESUR Sepsis Labs: @LABRCNTIP (procalcitonin:4,lacticidven:4)   Recent Results (from the past 240 hour(s))  Culture, blood (routine x 2)     Status: None (Preliminary result)   Collection Time: 02/27/17 12:30 AM  Result Value Ref Range Status   Specimen Description BLOOD RIGHT FOREARM  Final   Special Requests   Final    BOTTLES DRAWN AEROBIC AND ANAEROBIC Blood Culture adequate volume   Culture PENDING  Incomplete   Report Status PENDING  Incomplete      Radiology Studies: Dg Hand Complete Right Result Date: 02/26/2017 1. 2 x 1 mm radiopaque foreign body adjacent to the tuft of the right middle finger. 2. Generalized soft tissue edema of the hand and wrist without acute osseous abnormality.     Scheduled Meds: . atorvastatin  20 mg Oral QHS  . diltiazem  360 mg Oral Daily   Continuous Infusions: . piperacillin-tazobactam (ZOSYN)  IV 3.375 g (02/27/17 0834)  . vancomycin       LOS: 1 day    Time spent: 25 minutes  Greater than 50% of the time spent on counseling and coordinating the care.   Leisa Lenz, MD [facility] Pager [phone]  If 7PM-7AM, please contact night-coverage [email or web address] Password [email or web address] 02/27/2017, 9:20 AM

## 2017-02-27 NOTE — Progress Notes (Signed)
Text paged Dr. Charlies Silvers about loss stools, waiting for response. Will continue to monitor patient.

## 2017-02-27 NOTE — Consult Note (Signed)
ORTHOPAEDIC CONSULTATION  REQUESTING PHYSICIAN: Robbie Lis, MD  Chief Complaint: Cat bite dorsum right hand  Assessment / Plan: Active Problems:   Cellulitis  Cat bite, cellulitis dorsum of right hand Emergent/urgent surgical intervention does not appear to be needed, but recommend continuing antibiotics and close watch. N.p.o. for now-we will discuss with Dr. Percell Miller and provide update this morning. Erythema is stable, possibly better per patient. He is not toxic appearing nor does he feel sick. He has minimally painful.  Full range of motion in extension- actively and passively without significant pain.  Flexion slightly decreased due to swelling.   HPI: William Black is a 76 y.o. male who complains of right hand pain with increasing swelling and erythema in the area of a cat bite that occurred 2-3 days ago.  Erythema increased when he applied hydrocortisone cream.  He denies fever, chills, or other systemic symptoms.  Pain is mild and most significant when blood pressure cuff was applied to that upper arm.  He denies numbness, tingling, paresthesias.  XR showed generalized mild diffuse soft tissue swelling of the hand without underlying soft tissue emphysema. No frank bone destruction or fracture.  Today he is hungry and thirsty.  His pain is minimal..  Last meal was yesterday at about 5 PM. Denies history of MI, CVA, DVT, PE.  The patient was living with his wife.    Past Medical History:  Diagnosis Date  . High cholesterol   . Hypertension    History reviewed. No pertinent surgical history. Social History   Socioeconomic History  . Marital status: Married    Spouse name: None  . Number of children: None  . Years of education: None  . Highest education level: None  Social Needs  . Financial resource strain: None  . Food insecurity - worry: None  . Food insecurity - inability: None  . Transportation needs - medical: None  . Transportation needs -  non-medical: None  Occupational History  . None  Tobacco Use  . Smoking status: Never Smoker  Substance and Sexual Activity  . Alcohol use: No    Frequency: Never  . Drug use: No  . Sexual activity: None  Other Topics Concern  . None  Social History Narrative  . None   History reviewed. No pertinent family history. No Known Allergies Prior to Admission medications   Medication Sig Start Date End Date Taking? Authorizing Provider  amoxicillin-clavulanate (AUGMENTIN) 875-125 MG tablet Take 1 tablet by mouth daily. 02/26/17  Yes [provider]  atorvastatin (LIPITOR) 20 MG tablet Take 20 mg by mouth at bedtime.  02/15/17  Yes [provider]  diltiazem (CARDIZEM CD) 360 MG 24 hr capsule Take 360 mg by mouth daily. 02/15/17  Yes [provider]  lisinopril (PRINIVIL,ZESTRIL) 40 MG tablet Take 40 mg by mouth 2 (two) times daily. 02/15/17  Yes [provider]   Dg Hand Complete Right  Result Date: 02/26/2017 CLINICAL DATA:  Cat bite to the right hand 2 days ago. Patient presents with erythema. EXAM: RIGHT HAND - COMPLETE 3+ VIEW COMPARISON:  None. FINDINGS: There is generalized mild diffuse soft tissue swelling of the hand without underlying soft tissue emphysema. No frank bone destruction or fracture. Punctate 2 mm radiopaque foreign body is seen at the tip of the right right middle finger adjacent to the tuft along its volar and radial aspect. IMPRESSION: 1. 2 x 1 mm radiopaque foreign body adjacent to the tuft of the right  middle finger. 2. Generalized soft tissue edema of the hand and wrist without acute osseous abnormality. Electronically Signed   By: Ashley Royalty M.D.   On: 02/26/2017 22:44    Positive ROS: All other systems have been reviewed and were otherwise negative with the exception of those mentioned in the HPI and as above.  Objective: Labs cbc Recent Labs    02/26/17 1909 02/27/17 0214  WBC 10.8* 9.6  HGB 15.4 15.0  HCT 44.3 41.9    PLT 124* 124*    Labs inflam No results for input(s): CRP in the last 72 hours.  Invalid input(s): ESR  Labs coag Recent Labs    02/27/17 0214  INR 1.06    Recent Labs    02/26/17 1909 02/27/17 0214  NA 137 139  K 3.2* 3.2*  CL 106 107  CO2 21* 24  GLUCOSE 142* 118*  BUN 11 8  CREATININE 0.91 0.96  CALCIUM 8.8* 8.4*    Physical Exam: Vitals:   02/27/17 0140 02/27/17 0518  BP: (!) 144/74 (!) 151/76  Pulse: 73 65  Resp:  18  Temp: 98.7 F (37.1 C) (!) 97.5 F (36.4 C)  SpO2: 97% 97%   General: Alert, no acute distress Mental status: Alert and Oriented x3 Neurologic: Speech Clear and organized, no gross focal findings or movement disorder appreciated. Respiratory: No cyanosis, no use of accessory musculature Cardiovascular: No pedal edema GI: Abdomen is soft and non-tender, non-distended. Skin: Warm and dry.   Extremities: Warm and well perfused w/o edema Psychiatric: Patient is competent for consent with normal mood and affect  MUSCULOSKELETAL:  Dorsum of right hand mildly swollen and moderately erythematous with a small approximately 2-3 mm puncture wound mid dorsum of the hand between the long and ring metacarpals.  He is able to actively and passively move all fingers without significant pain.  There is no drainage or palpable fluctuance.  Range of motion slightly limited in flexion due to swelling.  He is neurovascularly intact. Other extremities are atraumatic with painless ROM and NVI.   Prudencio Burly III PA-C 02/27/2017 7:42 AM

## 2017-02-27 NOTE — Progress Notes (Signed)
Patient ID: William Black, male   DOB: [birth year], 76 y.o.   MRN: [id number]   LOS: 1 day   Subjective: Pain, swelling, and redness about the same but motion much better.   Objective: Vital signs in last 24 hours: Temp:  [97.5 F (36.4 C)-98.7 F (37.1 C)] 97.5 F (36.4 C) (12/17 0518) Pulse Rate:  [65-90] 65 (12/17 0518) Resp:  [18] 18 (12/17 0518) BP: (133-159)/(73-89) 151/76 (12/17 0518) SpO2:  [94 %-98 %] 97 % (12/17 0518) Weight:  [64.9 kg (143 lb)] 64.9 kg (143 lb) (12/16 1844) Last BM Date: 02/26/17   Laboratory  CBC Recent Labs    02/26/17 1909 02/27/17 0214  WBC 10.8* 9.6  HGB 15.4 15.0  HCT 44.3 41.9  PLT 124* 124*   BMET Recent Labs    02/26/17 1909 02/27/17 0214  NA 137 139  K 3.2* 3.2*  CL 106 107  CO2 21* 24  GLUCOSE 142* 118*  BUN 11 8  CREATININE 0.91 0.96  CALCIUM 8.8* 8.4*     Physical Exam General appearance: alert and no distress  Right hand: Erythema unchanged from pictures taken yesterday. Mild edema, TTP. Sensation intact, motion improved to point where he can make loose fist.   Assessment/Plan: Right hand cellulitis -- Given improvement will allow diet, think we can avoid OR at this point. Will recheck this afternoon. Mead, PA-C Orthopedic Surgery [phone] 02/27/2017

## 2017-02-27 NOTE — Progress Notes (Signed)
Hear back from Dr. Charlies Silvers, Dr. Charlies Silvers aware of situations, no interventions at this time.

## 2017-02-27 NOTE — ED Notes (Signed)
Pt stated that blood cultures already collected,  I will check with nurse.

## 2017-02-27 NOTE — ED Notes (Signed)
Per emt 1st blood cultures collected earlier 02/26/17.  Per main lab unable to locate blood cultures.

## 2017-02-28 DIAGNOSIS — W5501XA Bitten by cat, initial encounter: Secondary | ICD-10-CM

## 2017-02-28 DIAGNOSIS — L03113 Cellulitis of right upper limb: Principal | ICD-10-CM

## 2017-02-28 LAB — CBC
HCT: 43.8 % (ref 39.0–52.0)
HEMOGLOBIN: 15.2 g/dL (ref 13.0–17.0)
MCH: 33 pg (ref 26.0–34.0)
MCHC: 34.7 g/dL (ref 30.0–36.0)
MCV: 95 fL (ref 78.0–100.0)
Platelets: 108 10*3/uL — ABNORMAL LOW (ref 150–400)
RBC: 4.61 MIL/uL (ref 4.22–5.81)
RDW: 13.9 % (ref 11.5–15.5)
WBC: 6.9 10*3/uL (ref 4.0–10.5)

## 2017-02-28 LAB — BASIC METABOLIC PANEL
Anion gap: 9 (ref 5–15)
BUN: 7 mg/dL (ref 6–20)
CHLORIDE: 109 mmol/L (ref 101–111)
CO2: 21 mmol/L — AB (ref 22–32)
CREATININE: 1 mg/dL (ref 0.61–1.24)
Calcium: 8.5 mg/dL — ABNORMAL LOW (ref 8.9–10.3)
GFR calc non Af Amer: >60 mL/min (ref >60)
Glucose, Bld: 123 mg/dL — ABNORMAL HIGH (ref 65–99)
POTASSIUM: 3.8 mmol/L (ref 3.5–5.1)
Sodium: 139 mmol/L (ref 135–145)

## 2017-02-28 MED ORDER — DILTIAZEM HCL ER COATED BEADS 180 MG PO CP24
360.0000 mg | ORAL_CAPSULE | Freq: Every evening | ORAL | Status: DC
  Administered 2017-02-28: 360 mg via ORAL
  Filled 2017-02-28: qty 2

## 2017-02-28 MED ORDER — AMOXICILLIN-POT CLAVULANATE 875-125 MG PO TABS
1.0000 | ORAL_TABLET | Freq: Two times a day (BID) | ORAL | Status: DC
  Administered 2017-02-28 – 2017-03-01 (×3): 1 via ORAL
  Filled 2017-02-28 (×3): qty 1

## 2017-02-28 MED ORDER — SACCHAROMYCES BOULARDII 250 MG PO CAPS
250.0000 mg | ORAL_CAPSULE | Freq: Two times a day (BID) | ORAL | Status: DC
  Administered 2017-02-28 – 2017-03-01 (×3): 250 mg via ORAL
  Filled 2017-02-28 (×3): qty 1

## 2017-02-28 MED ORDER — SODIUM CHLORIDE 0.9 % IV SOLN
INTRAVENOUS | Status: DC
  Administered 2017-02-28 – 2017-03-01 (×2): via INTRAVENOUS

## 2017-02-28 NOTE — Progress Notes (Addendum)
PROGRESS NOTE    William Black  VZC:[id number] DOB: 1941/02/09 DOA: 02/26/2017 PCP: William Seashore, MD    Brief Narrative: 76 year old male with history of hypertension who presented to ED with worsening swelling and redness of the right hand after he had a cat bite about few days prior to the admission. Patient was started on empiric vancomycin and Zosyn while awaiting culture results. Orthopedic surgery consulted.   Assessment & Plan:   Active Problems:   Cellulitis   1-Right hand cellulitis; cat bite  Redness persist. Edema trending down. Able to move finger and hand without significant pain. No further edema or redness tracking to arm.  On IV Unasyn. Transition to oral Augmentin to make sure patient is able to tolerates antibiotics due to diarrhea.  Ortho following.   Hypokalemia; resolved.   Diarrhea; he report 3 to 4 BM today, mix watery and loose.  Will ask nurse to monitor diarrhea, if he has persistent watery diarrhea will check for C diff.  Start florastore.  IV fluids.   HTN; on Cardizem.   Thrombocytopenia;  Follow trend.   DVT prophylaxis: SCD.   Code Status: full code.  Family Communication: wife and daughter at bedside.  Disposition Plan: home hopefully in 24 hours.    Consultants:   Dr William Black    Procedures: none   Antimicrobials: Unasyn-- change to Augmentin.    Subjective: He report 3 to 4 BM, looses, watery. He denies abdominal pain. Swelling right had decreased, no further tracking of redness to forearm.    Objective: Vitals:   02/27/17 1423 02/27/17 2008 02/28/17 0700 02/28/17 1256  BP: 125/63 138/85 (!) 153/80 117/61  Pulse: 61 60 (!) 59 62  Resp: 17 17 17 18   Temp: 98 F (36.7 C) 98 F (36.7 C) 97.6 F (36.4 C) 97.9 F (36.6 C)  TempSrc: Oral Oral Oral Oral  SpO2: 98% 98% 96% 95%  Weight:      Height:        Intake/Output Summary (Last 24 hours) at 02/28/2017 1503 Last data filed at 02/28/2017 1300 Gross per 24  hour  Intake 940 ml  Output -  Net 940 ml   Filed Weights   02/26/17 1844  Weight: 64.9 kg (143 lb)    Examination:  General exam: Appears calm and comfortable  Respiratory system: Clear to auscultation. Respiratory effort normal. Cardiovascular system: S1 & S2 heard, RRR. No JVD, murmurs, rubs, gallops or clicks. No pedal edema. Gastrointestinal system: Abdomen is nondistended, soft and nontender. No organomegaly or masses felt. Normal bowel sounds heard. Central nervous system: Alert and oriented. No focal neurological deficits. Extremities: Symmetric 5 x 5 power. Skin: right hand with redness.  Psychiatry: Judgement and insight appear normal. Mood & affect appropriate.     Data Reviewed: I have personally reviewed following labs and imaging studies  CBC: Recent Labs  Lab 02/26/17 1909 02/27/17 0214 02/28/17 0655  WBC 10.8* 9.6 6.9  NEUTROABS 7.0  --   --   HGB 15.4 15.0 15.2  HCT 44.3 41.9 43.8  MCV 95.3 94.6 95.0  PLT 124* 124* 128*   Basic Metabolic Panel: Recent Labs  Lab 02/26/17 1909 02/27/17 0214 02/28/17 0655  NA 137 139 139  K 3.2* 3.2* 3.8  CL 106 107 109  CO2 21* 24 21*  GLUCOSE 142* 118* 123*  BUN 11 8 7   CREATININE 0.91 0.96 1.00  CALCIUM 8.8* 8.4* 8.5*   GFR: Estimated Creatinine Clearance: 59.5 mL/min (by C-G formula  based on SCr of 1 mg/dL). Liver Function Tests: Recent Labs  Lab 02/26/17 1909  AST 33  ALT 29  ALKPHOS 52  BILITOT 0.5  PROT 5.7*  ALBUMIN 3.6   No results for input(s): LIPASE, AMYLASE in the last 168 hours. No results for input(s): AMMONIA in the last 168 hours. Coagulation Profile: Recent Labs  Lab 02/27/17 0214  INR 1.06   Cardiac Enzymes: No results for input(s): CKTOTAL, CKMB, CKMBINDEX, TROPONINI in the last 168 hours. BNP (last 3 results) No results for input(s): PROBNP in the last 8760 hours. HbA1C: No results for input(s): HGBA1C in the last 72 hours. CBG: No results for input(s): GLUCAP in the  last 168 hours. Lipid Profile: No results for input(s): CHOL, HDL, LDLCALC, TRIG, CHOLHDL, LDLDIRECT in the last 72 hours. Thyroid Function Tests: No results for input(s): TSH, T4TOTAL, FREET4, T3FREE, THYROIDAB in the last 72 hours. Anemia Panel: No results for input(s): VITAMINB12, FOLATE, FERRITIN, TIBC, IRON, RETICCTPCT in the last 72 hours. Sepsis Labs: Recent Labs  Lab 02/26/17 1923 02/27/17 0043  LATICACIDVEN 1.36 1.83    Recent Results (from the past 240 hour(s))  Culture, blood (routine x 2)     Status: None (Preliminary result)   Collection Time: 02/27/17 12:30 AM  Result Value Ref Range Status   Specimen Description BLOOD RIGHT FOREARM  Final   Special Requests   Final    BOTTLES DRAWN AEROBIC AND ANAEROBIC Blood Culture adequate volume   Culture PENDING  Incomplete   Report Status PENDING  Incomplete         Radiology Studies: Dg Hand Complete Right  Result Date: 02/26/2017 CLINICAL DATA:  Cat bite to the right hand 2 days ago. Patient presents with erythema. EXAM: RIGHT HAND - COMPLETE 3+ VIEW COMPARISON:  None. FINDINGS: There is generalized mild diffuse soft tissue swelling of the hand without underlying soft tissue emphysema. No frank bone destruction or fracture. Punctate 2 mm radiopaque foreign body is seen at the tip of the right right middle finger adjacent to the tuft along its volar and radial aspect. IMPRESSION: 1. 2 x 1 mm radiopaque foreign body adjacent to the tuft of the right middle finger. 2. Generalized soft tissue edema of the hand and wrist without acute osseous abnormality. Electronically Signed   By: William Black M.D.   On: 02/26/2017 22:44        Scheduled Meds: . amoxicillin-clavulanate  1 tablet Oral Q12H  . atorvastatin  20 mg Oral QHS  . diltiazem  360 mg Oral QPM  . saccharomyces boulardii  250 mg Oral BID  . sodium chloride flush  3 mL Intravenous Q12H   Continuous Infusions: . sodium chloride 75 mL/hr at 02/28/17 1445      LOS: 2 days    Time spent: 35 minutes.     William Shiley, MD [facility] Pager [phone]  If 7PM-7AM, please contact night-coverage [email or web address] Password [email or web address] 02/28/2017, 3:03 PM

## 2017-02-28 NOTE — Progress Notes (Signed)
Patient ID: William Black, male   DOB: 04-17-1940, 76 y.o.   MRN: [id number]   LOS: 2 days   Subjective: Maybe feels a little better   Objective: Vital signs in last 24 hours: Temp:  [97.6 F (36.4 C)-98 F (36.7 C)] 97.6 F (36.4 C) (12/18 0700) Pulse Rate:  [59-61] 59 (12/18 0700) Resp:  [17] 17 (12/18 0700) BP: (125-153)/(63-85) 153/80 (12/18 0700) SpO2:  [96 %-98 %] 96 % (12/18 0700) Last BM Date: 02/27/17   Laboratory  CBC Recent Labs    02/27/17 0214 02/28/17 0655  WBC 9.6 6.9  HGB 15.0 15.2  HCT 41.9 43.8  PLT 124* PENDING   BMET Recent Labs    02/27/17 0214 02/28/17 0655  NA 139 139  K 3.2* 3.8  CL 107 109  CO2 24 21*  GLUCOSE 118* 123*  BUN 8 7  CREATININE 0.96 1.00  CALCIUM 8.4* 8.5*     Physical Exam General appearance: alert and no distress  Right hand: Erythema faded somewhat, can make tight fist   Assessment/Plan: Right hand cellulitis -- Continued improvement, would consider transition to Augmentin and discharge. Given diarrhea he may need additional day to make sure he can tolerate.    Lisette Abu, PA-C Orthopedic Surgery [phone] 02/28/2017

## 2017-03-01 MED ORDER — SACCHAROMYCES BOULARDII 250 MG PO CAPS: 250.0000 mg | ORAL_CAPSULE | Freq: Two times a day (BID) | ORAL | 0 refills | Status: DC

## 2017-03-01 MED ORDER — AMOXICILLIN-POT CLAVULANATE 875-125 MG PO TABS: 1.0000 | ORAL_TABLET | Freq: Every day | ORAL | 0 refills | Status: DC

## 2017-03-01 NOTE — Progress Notes (Signed)
Patient ID: William Black, male   DOB: 01-17-1941, 76 y.o.   MRN: [id number]   LOS: 3 days   Subjective: Hand much improved, diarrhea resolved.   Objective: Vital signs in last 24 hours: Temp:  [97.6 F (36.4 C)-98.2 F (36.8 C)] 97.6 F (36.4 C) (12/19 0751) Pulse Rate:  [60-62] 60 (12/19 0751) Resp:  [16-18] 16 (12/19 0751) BP: (117-148)/(61-77) 148/77 (12/19 0751) SpO2:  [95 %-97 %] 96 % (12/19 0751) Last BM Date: 02/28/17   Laboratory  CBC Recent Labs    02/27/17 0214 02/28/17 0655  WBC 9.6 6.9  HGB 15.0 15.2  HCT 41.9 43.8  PLT 124* 108*   BMET Recent Labs    02/27/17 0214 02/28/17 0655  NA 139 139  K 3.2* 3.8  CL 107 109  CO2 24 21*  GLUCOSE 118* 123*  BUN 8 7  CREATININE 0.96 1.00  CALCIUM 8.4* 8.5*     Physical Exam General appearance: alert and no distress  Right hand: Erythema receded distally about 0.5cm, edema improved, ROM normal and pain-free   Assessment/Plan: Right hand cellulitis 2/2 cat bite -- D/C home on appropriate duration of Augmentin. F/u with Dr. Percell Miller can occur on prn basis.    Lisette Abu, PA-C Orthopedic Surgery [phone] 03/01/2017

## 2017-03-01 NOTE — Discharge Summary (Signed)
Physician Discharge Summary  William Black VQQ:[id number] DOB: 1941/02/02 DOA: 02/26/2017  PCP: [name], MD  Admit date: 02/26/2017 Discharge date: 03/01/2017  Admitted From: Home  Disposition:  Home   Recommendations for Outpatient Follow-up:  1. Follow up with PCP in 1-2 weeks 2. Please obtain BMP/CBC in one week 3. Follow up on resolution of cellulitis.  4. Need CBC to follow platelet level.   Home Health: no  Discharge Condition: stable.  CODE STATUS: full code.  Diet recommendation: Heart Healthy   Brief/Interim Summary:  76 year old male with history of hypertension who presented to ED with worsening swelling and redness of the right hand after he had a cat bite about few days prior to the admission. Patient was started on empiric vancomycin and Zosyn while awaiting culture results. Orthopedic surgery consulted.  Assessment & Plan:   Active Problems:   Cellulitis   1-Right hand cellulitis; cat bite  Redness persist. Edema trending down. Able to move finger and hand without significant pain. No further edema or redness tracking to arm.  Treated  IV Unasyn. Transition to oral Augmentin to make sure patient is able to tolerates antibiotics due to diarrhea.  Ortho following. Ok to discharge on oral Augmentin. Will provide total of 10 days treatment. Consider total 14 days on follow up if no improvement.   Hypokalemia; resolved.   Diarrhea; he report 3 to 4 BM today, mix watery and loose.  Started  florastore.  Resolved.   HTN; on Cardizem.   Thrombocytopenia;  Follow trend.     Discharge Diagnoses:  Active Problems:   Cellulitis    Discharge Instructions  Discharge Instructions    Diet - low sodium heart healthy   Complete by:  As directed    Increase activity slowly   Complete by:  As directed      Allergies as of 03/01/2017   No Known Allergies     Medication List    TAKE these medications   amoxicillin-clavulanate  875-125 MG tablet Commonly known as:  AUGMENTIN Take 1 tablet by mouth daily.   atorvastatin 20 MG tablet Commonly known as:  LIPITOR Take 20 mg by mouth at bedtime.   diltiazem 360 MG 24 hr capsule Commonly known as:  CARDIZEM CD Take 360 mg by mouth daily.   lisinopril 40 MG tablet Commonly known as:  PRINIVIL,ZESTRIL Take 40 mg by mouth 2 (two) times daily.   saccharomyces boulardii 250 MG capsule Commonly known as:  FLORASTOR Take 1 capsule (250 mg total) by mouth 2 (two) times daily.      Follow-up Information    [name], MD Follow up.   Specialty:  Orthopedic Surgery Why:  Call as needed Contact information: [address] [place] [address] [phone]        [name], MD Follow up in 1 week(s).   Specialty:  Internal Medicine Contact information: [address] [place] [address] [phone]          No Known Allergies  Consultations:  Ortho    Procedures/Studies: Dg Hand Complete Right  Result Date: 02/26/2017 CLINICAL DATA:  Cat bite to the right hand 2 days ago. Patient presents with erythema. EXAM: RIGHT HAND - COMPLETE 3+ VIEW COMPARISON:  None. FINDINGS: There is generalized mild diffuse soft tissue swelling of the hand without underlying soft tissue emphysema. No frank bone destruction or fracture. Punctate 2 mm radiopaque foreign body is seen at the tip of the  right right middle finger adjacent to the tuft along its volar and radial aspect. IMPRESSION: 1. 2 x 1 mm radiopaque foreign body adjacent to the tuft of the right middle finger. 2. Generalized soft tissue edema of the hand and wrist without acute osseous abnormality. Electronically Signed   By: Ashley Royalty M.D.   On: 02/26/2017 22:44       Subjective: Diarrhea resolved. Redness of had improved  Discharge Exam: Vitals:   02/28/17 2208 03/01/17 0751  BP: 138/77 (!) 148/77  Pulse: 61 60  Resp: 16 16  Temp: 98.2 F  (36.8 C) 97.6 F (36.4 C)  SpO2: 97% 96%   Vitals:   02/28/17 0700 02/28/17 1256 02/28/17 2208 03/01/17 0751  BP: (!) 153/80 117/61 138/77 (!) 148/77  Pulse: (!) 59 62 61 60  Resp: 17 18 16 16   Temp: 97.6 F (36.4 C) 97.9 F (36.6 C) 98.2 F (36.8 C) 97.6 F (36.4 C)  TempSrc: Oral Oral Oral Oral  SpO2: 96% 95% 97% 96%  Weight:      Height:        General: Pt is alert, awake, not in acute distress Cardiovascular: RRR, S1/S2 +, no rubs, no gallops Respiratory: CTA bilaterally, no wheezing, no rhonchi Abdominal: Soft, NT, ND, bowel sounds + Extremities: no edema, no cyanosis Right hand with less redness.     The results of significant diagnostics from this hospitalization (including imaging, microbiology, ancillary and laboratory) are listed below for reference.     Microbiology: Recent Results (from the past 240 hour(s))  Culture, blood (routine x 2)     Status: None (Preliminary result)   Collection Time: 02/27/17 12:30 AM  Result Value Ref Range Status   Specimen Description BLOOD RIGHT FOREARM  Final   Special Requests   Final    BOTTLES DRAWN AEROBIC AND ANAEROBIC Blood Culture adequate volume   Culture NO GROWTH 1 DAY  Final   Report Status PENDING  Incomplete  Culture, blood (routine x 2)     Status: None (Preliminary result)   Collection Time: 02/27/17  2:14 AM  Result Value Ref Range Status   Specimen Description BLOOD RIGHT ARM  Final   Special Requests IN PEDIATRIC BOTTLE Blood Culture adequate volume  Final   Culture NO GROWTH 1 DAY  Final   Report Status PENDING  Incomplete     Labs: BNP (last 3 results) No results for input(s): BNP in the last 8760 hours. Basic Metabolic Panel: Recent Labs  Lab 02/26/17 1909 02/27/17 0214 02/28/17 0655  NA 137 139 139  K 3.2* 3.2* 3.8  CL 106 107 109  CO2 21* 24 21*  GLUCOSE 142* 118* 123*  BUN 11 8 7   CREATININE 0.91 0.96 1.00  CALCIUM 8.8* 8.4* 8.5*   Liver Function Tests: Recent Labs  Lab  02/26/17 1909  AST 33  ALT 29  ALKPHOS 52  BILITOT 0.5  PROT 5.7*  ALBUMIN 3.6   No results for input(s): LIPASE, AMYLASE in the last 168 hours. No results for input(s): AMMONIA in the last 168 hours. CBC: Recent Labs  Lab 02/26/17 1909 02/27/17 0214 02/28/17 0655  WBC 10.8* 9.6 6.9  NEUTROABS 7.0  --   --   HGB 15.4 15.0 15.2  HCT 44.3 41.9 43.8  MCV 95.3 94.6 95.0  PLT 124* 124* 108*   Cardiac Enzymes: No results for input(s): CKTOTAL, CKMB, CKMBINDEX, TROPONINI in the last 168 hours. BNP: Invalid input(s): POCBNP CBG: No results for input(s): GLUCAP  in the last 168 hours. D-Dimer No results for input(s): DDIMER in the last 72 hours. Hgb A1c No results for input(s): HGBA1C in the last 72 hours. Lipid Profile No results for input(s): CHOL, HDL, LDLCALC, TRIG, CHOLHDL, LDLDIRECT in the last 72 hours. Thyroid function studies No results for input(s): TSH, T4TOTAL, T3FREE, THYROIDAB in the last 72 hours.  Invalid input(s): FREET3 Anemia work up No results for input(s): VITAMINB12, FOLATE, FERRITIN, TIBC, IRON, RETICCTPCT in the last 72 hours. Urinalysis No results found for: COLORURINE, APPEARANCEUR, [place], [place], [place], [place], [place], [place], PROTEINUR, UROBILINOGEN, NITRITE, LEUKOCYTESUR Sepsis Labs Invalid input(s): PROCALCITONIN,  WBC,  LACTICIDVEN Microbiology Recent Results (from the past 240 hour(s))  Culture, blood (routine x 2)     Status: None (Preliminary result)   Collection Time: 02/27/17 12:30 AM  Result Value Ref Range Status   Specimen Description BLOOD RIGHT FOREARM  Final   Special Requests   Final    BOTTLES DRAWN AEROBIC AND ANAEROBIC Blood Culture adequate volume   Culture NO GROWTH 1 DAY  Final   Report Status PENDING  Incomplete  Culture, blood (routine x 2)     Status: None (Preliminary result)   Collection Time: 02/27/17  2:14 AM  Result Value Ref Range Status   Specimen Description BLOOD RIGHT ARM  Final   Special  Requests IN PEDIATRIC BOTTLE Blood Culture adequate volume  Final   Culture NO GROWTH 1 DAY  Final   Report Status PENDING  Incomplete     Time coordinating discharge: Over 30 minutes  SIGNED:   Elmarie Shiley, MD  [facility] 03/01/2017, 10:50 AM Pager   If 7PM-7AM, please contact night-coverage [email or web address] Password [email or web address]

## 2017-03-01 NOTE — Care Management Important Message (Signed)
Important Message  Patient Details  Name: William Black MRN: [id number] Date of Birth: [birth year]   Medicare Important Message Given:  Yes    [name] 03/01/2017, 3:33 PM

## 2017-03-04 LAB — CULTURE, BLOOD (ROUTINE X 2)
Culture: NO GROWTH
Culture: NO GROWTH
SPECIAL REQUESTS: ADEQUATE
SPECIAL REQUESTS: ADEQUATE

## 2017-04-28 DIAGNOSIS — N390 Urinary tract infection, site not specified: Secondary | ICD-10-CM | POA: Diagnosis not present

## 2017-05-01 DIAGNOSIS — N390 Urinary tract infection, site not specified: Secondary | ICD-10-CM | POA: Diagnosis not present

## 2017-05-01 DIAGNOSIS — N401 Enlarged prostate with lower urinary tract symptoms: Secondary | ICD-10-CM | POA: Diagnosis not present

## 2017-05-04 DIAGNOSIS — E161 Other hypoglycemia: Secondary | ICD-10-CM | POA: Diagnosis not present

## 2017-05-04 DIAGNOSIS — R748 Abnormal levels of other serum enzymes: Secondary | ICD-10-CM | POA: Diagnosis not present

## 2017-05-04 DIAGNOSIS — E782 Mixed hyperlipidemia: Secondary | ICD-10-CM | POA: Diagnosis not present

## 2017-05-04 DIAGNOSIS — I10 Essential (primary) hypertension: Secondary | ICD-10-CM | POA: Diagnosis not present

## 2017-05-04 DIAGNOSIS — N390 Urinary tract infection, site not specified: Secondary | ICD-10-CM | POA: Diagnosis not present

## 2017-05-04 DIAGNOSIS — N401 Enlarged prostate with lower urinary tract symptoms: Secondary | ICD-10-CM | POA: Diagnosis not present

## 2017-05-10 DIAGNOSIS — R945 Abnormal results of liver function studies: Secondary | ICD-10-CM | POA: Diagnosis not present

## 2017-05-31 DIAGNOSIS — R945 Abnormal results of liver function studies: Secondary | ICD-10-CM | POA: Diagnosis not present

## 2017-07-20 DIAGNOSIS — N401 Enlarged prostate with lower urinary tract symptoms: Secondary | ICD-10-CM | POA: Diagnosis not present

## 2017-07-20 DIAGNOSIS — E782 Mixed hyperlipidemia: Secondary | ICD-10-CM | POA: Diagnosis not present

## 2017-07-20 DIAGNOSIS — R69 Illness, unspecified: Secondary | ICD-10-CM | POA: Diagnosis not present

## 2017-07-20 DIAGNOSIS — Z0000 Encounter for general adult medical examination without abnormal findings: Secondary | ICD-10-CM | POA: Diagnosis not present

## 2017-07-20 DIAGNOSIS — I10 Essential (primary) hypertension: Secondary | ICD-10-CM | POA: Diagnosis not present

## 2017-07-21 ENCOUNTER — Other Ambulatory Visit (HOSPITAL_COMMUNITY): Payer: Self-pay | Admitting: Internal Medicine

## 2017-07-21 DIAGNOSIS — F17200 Nicotine dependence, unspecified, uncomplicated: Secondary | ICD-10-CM

## 2017-07-27 DIAGNOSIS — N401 Enlarged prostate with lower urinary tract symptoms: Secondary | ICD-10-CM | POA: Diagnosis not present

## 2017-07-27 DIAGNOSIS — E782 Mixed hyperlipidemia: Secondary | ICD-10-CM | POA: Diagnosis not present

## 2017-07-27 DIAGNOSIS — R69 Illness, unspecified: Secondary | ICD-10-CM | POA: Diagnosis not present

## 2017-07-27 DIAGNOSIS — I10 Essential (primary) hypertension: Secondary | ICD-10-CM | POA: Diagnosis not present

## 2017-07-27 DIAGNOSIS — N200 Calculus of kidney: Secondary | ICD-10-CM | POA: Diagnosis not present

## 2017-08-01 ENCOUNTER — Ambulatory Visit (HOSPITAL_COMMUNITY)
Admission: RE | Admit: 2017-08-01 | Discharge: 2017-08-01 | Disposition: A | Discharge status: Home / Self Care | Payer: Medicare HMO | Source: Ambulatory Visit | Attending: Internal Medicine | Admitting: Internal Medicine

## 2017-08-01 DIAGNOSIS — R222 Localized swelling, mass and lump, trunk: Secondary | ICD-10-CM | POA: Diagnosis not present

## 2017-08-01 DIAGNOSIS — Z122 Encounter for screening for malignant neoplasm of respiratory organs: Secondary | ICD-10-CM | POA: Diagnosis not present

## 2017-08-01 DIAGNOSIS — R69 Illness, unspecified: Secondary | ICD-10-CM | POA: Diagnosis not present

## 2017-08-01 DIAGNOSIS — I700 Atherosclerosis of aorta: Secondary | ICD-10-CM | POA: Diagnosis not present

## 2017-08-01 DIAGNOSIS — J439 Emphysema, unspecified: Secondary | ICD-10-CM | POA: Diagnosis not present

## 2017-08-01 DIAGNOSIS — F17200 Nicotine dependence, unspecified, uncomplicated: Secondary | ICD-10-CM | POA: Diagnosis present

## 2017-10-04 ENCOUNTER — Encounter (HOSPITAL_COMMUNITY): Payer: Self-pay | Admitting: Emergency Medicine

## 2017-10-04 ENCOUNTER — Emergency Department (HOSPITAL_COMMUNITY): Payer: Medicare HMO

## 2017-10-04 ENCOUNTER — Ambulatory Visit (HOSPITAL_COMMUNITY)
Admission: EM | Admit: 2017-10-04 | Discharge: 2017-10-04 | Disposition: A | Discharge status: Home / Self Care | Payer: Medicare HMO | Source: Home / Self Care | Attending: Family Medicine | Admitting: Family Medicine

## 2017-10-04 ENCOUNTER — Other Ambulatory Visit: Payer: Self-pay

## 2017-10-04 ENCOUNTER — Emergency Department (HOSPITAL_COMMUNITY)
Admission: EM | Admit: 2017-10-04 | Discharge: 2017-10-05 | Disposition: A | Discharge status: Home / Self Care | Payer: Medicare HMO | Attending: Emergency Medicine | Admitting: Emergency Medicine

## 2017-10-04 DIAGNOSIS — I10 Essential (primary) hypertension: Secondary | ICD-10-CM | POA: Diagnosis not present

## 2017-10-04 DIAGNOSIS — Z79899 Other long term (current) drug therapy: Secondary | ICD-10-CM | POA: Diagnosis not present

## 2017-10-04 DIAGNOSIS — R319 Hematuria, unspecified: Secondary | ICD-10-CM | POA: Diagnosis not present

## 2017-10-04 DIAGNOSIS — N400 Enlarged prostate without lower urinary tract symptoms: Secondary | ICD-10-CM | POA: Insufficient documentation

## 2017-10-04 DIAGNOSIS — N401 Enlarged prostate with lower urinary tract symptoms: Secondary | ICD-10-CM | POA: Diagnosis not present

## 2017-10-04 LAB — POCT URINALYSIS DIP (DEVICE)
Bilirubin Urine: NEGATIVE
Glucose, UA: NEGATIVE mg/dL
Ketones, ur: NEGATIVE mg/dL
Leukocytes, UA: NEGATIVE
Nitrite: NEGATIVE
Protein, ur: 30 mg/dL — AB
Specific Gravity, Urine: 1.025 (ref 1.005–1.030)
Urobilinogen, UA: 0.2 mg/dL (ref 0.0–1.0)
pH: 5.5 (ref 5.0–8.0)

## 2017-10-04 LAB — CBC WITH DIFFERENTIAL/PLATELET
ABS IMMATURE GRANULOCYTES: 0 10*3/uL (ref 0.0–0.1)
BASOS PCT: 1 %
Basophils Absolute: 0.1 10*3/uL (ref 0.0–0.1)
Eosinophils Absolute: 0.1 10*3/uL (ref 0.0–0.7)
Eosinophils Relative: 2 %
HCT: 46.9 % (ref 39.0–52.0)
Hemoglobin: 16.1 g/dL (ref 13.0–17.0)
IMMATURE GRANULOCYTES: 0 %
LYMPHS ABS: 3 10*3/uL (ref 0.7–4.0)
Lymphocytes Relative: 52 %
MCH: 33 pg (ref 26.0–34.0)
MCHC: 34.3 g/dL (ref 30.0–36.0)
MCV: 96.1 fL (ref 78.0–100.0)
MONOS PCT: 13 %
Monocytes Absolute: 0.8 10*3/uL (ref 0.1–1.0)
NEUTROS PCT: 32 %
Neutro Abs: 1.9 10*3/uL (ref 1.7–7.7)
PLATELETS: 140 10*3/uL — AB (ref 150–400)
RBC: 4.88 MIL/uL (ref 4.22–5.81)
RDW: 13.7 % (ref 11.5–15.5)
WBC: 5.8 10*3/uL (ref 4.0–10.5)

## 2017-10-04 LAB — BASIC METABOLIC PANEL
ANION GAP: 8 (ref 5–15)
BUN: 12 mg/dL (ref 8–23)
CO2: 25 mmol/L (ref 22–32)
Calcium: 9.4 mg/dL (ref 8.9–10.3)
Chloride: 109 mmol/L (ref 98–111)
Creatinine, Ser: 0.97 mg/dL (ref 0.61–1.24)
GFR calc Af Amer: >60 mL/min (ref >60)
Glucose, Bld: 106 mg/dL — ABNORMAL HIGH (ref 70–99)
POTASSIUM: 3.7 mmol/L (ref 3.5–5.1)
SODIUM: 142 mmol/L (ref 135–145)

## 2017-10-04 LAB — SAMPLE TO BLOOD BANK

## 2017-10-04 NOTE — ED Triage Notes (Signed)
Patient reports multiple episodes of hematuria and urinary frequency today , he was seen at urgent care this afternoon discharged home - hematuria continues .

## 2017-10-04 NOTE — ED Provider Notes (Signed)
[place]    ASSESSMENT & PLAN:  1. Hematuria, unspecified type    Follow-up Information    [name], MD.   Specialty:  Internal Medicine Why:  Please call and schedule a follow up visit. Contact information: [place] [address] [phone]        [place].   Specialty:  Emergency Medicine Why:  If symptoms worsen. Contact information: [address] [id number] [place]         No treatment needed. Discussed possibility of nephrolithiasis.  Outlined signs and symptoms indicating need for more acute intervention. Patient verbalized understanding. After Visit Summary given.  SUBJECTIVE:  William Black is a 77 y.o. male who complains of 'red-tinged urine' noticed today. H/O similar in distant past from 'bladder stones'. No urinary urgency or dysuria. Mild R inguinal pain for a few minutes today. Resolved and has not returned. Normal PO intake. No abdominal pain. No self treatment. Ambulatory without difficulty.  ROS: As in HPI.  OBJECTIVE:  Vitals:   10/04/17 1619  BP: (!) 177/89  Pulse: 63  Resp: 18  Temp: 98.1 F (36.7 C)  TempSrc: Oral  SpO2: 96%   Appears well, in no apparent distress. Abdomen is soft without tenderness, guarding, mass, rebound or organomegaly. No CVA tenderness or inguinal adenopathy noted.  Labs Reviewed  POCT URINALYSIS DIP (DEVICE) - Abnormal; Notable for the following components:      Result Value   Hgb urine dipstick LARGE (*)    Protein, ur 30 (*)    All other components within normal limits    No Known Allergies  Past Medical History:  Diagnosis Date  . High cholesterol   . Hypertension    Social History   Socioeconomic History  . Marital status: Married    Spouse name: Not on file  . Number of children: Not on file  . Years of education: Not on file  . Highest  education level: Not on file  Occupational History  . Not on file  Social Needs  . Financial resource strain: Not on file  . Food insecurity:    Worry: Not on file    Inability: Not on file  . Transportation needs:    Medical: Not on file    Non-medical: Not on file  Tobacco Use  . Smoking status: Never Smoker  . Smokeless tobacco: Never Used  Substance and Sexual Activity  . Alcohol use: No    Frequency: Never  . Drug use: No  . Sexual activity: Not on file  Lifestyle  . Physical activity:    Days per week: Not on file    Minutes per session: Not on file  . Stress: Not on file  Relationships  . Social connections:    Talks on phone: Not on file    Gets together: Not on file    Attends religious service: Not on file    Active member of club or organization: Not on file    Attends meetings of clubs or organizations: Not on file    Relationship status: Not on file  . Intimate partner violence:    Fear of current or ex partner: Not on file    Emotionally abused: Not on file    Physically abused: Not on file    Forced sexual activity: Not on file  Other Topics Concern  . Not on file  Social History Narrative  . Not on  file   History reviewed. No pertinent family history.     Vanessa Kick, MD 10/05/17 1114

## 2017-10-04 NOTE — ED Triage Notes (Signed)
Pt states he used the restroom today and noticed a pink tinge to his urine.  He states he had a small amount of pain in his RLQ but after he urinated it went away.    He has a history of bladder of stones.

## 2017-10-05 ENCOUNTER — Encounter (HOSPITAL_COMMUNITY): Payer: Self-pay | Admitting: Emergency Medicine

## 2017-10-05 DIAGNOSIS — Z79899 Other long term (current) drug therapy: Secondary | ICD-10-CM | POA: Diagnosis not present

## 2017-10-05 DIAGNOSIS — I10 Essential (primary) hypertension: Secondary | ICD-10-CM | POA: Diagnosis not present

## 2017-10-05 DIAGNOSIS — N400 Enlarged prostate without lower urinary tract symptoms: Secondary | ICD-10-CM | POA: Diagnosis not present

## 2017-10-05 DIAGNOSIS — R319 Hematuria, unspecified: Secondary | ICD-10-CM | POA: Diagnosis not present

## 2017-10-05 LAB — URINALYSIS, ROUTINE W REFLEX MICROSCOPIC
Bilirubin Urine: NEGATIVE
Glucose, UA: NEGATIVE mg/dL
Ketones, ur: NEGATIVE mg/dL
Leukocytes, UA: NEGATIVE
Nitrite: NEGATIVE
Protein, ur: 30 mg/dL — AB
RBC / HPF: >50 RBC/hpf — ABNORMAL HIGH (ref 0–5)
Specific Gravity, Urine: 1.006 (ref 1.005–1.030)
pH: 6 (ref 5.0–8.0)

## 2017-10-05 MED ORDER — CEPHALEXIN 500 MG PO CAPS: 500.0000 mg | ORAL_CAPSULE | Freq: Four times a day (QID) | ORAL | 0 refills | Status: DC

## 2017-10-05 MED ORDER — TAMSULOSIN HCL 0.4 MG PO CAPS: 0.4000 mg | ORAL_CAPSULE | Freq: Every day | ORAL | Status: DC

## 2017-10-05 MED ORDER — CEPHALEXIN 250 MG PO CAPS
500.0000 mg | ORAL_CAPSULE | Freq: Once | ORAL | Status: AC
  Administered 2017-10-05: 500 mg via ORAL
  Filled 2017-10-05: qty 2

## 2017-10-05 NOTE — ED Provider Notes (Signed)
[facility] EMERGENCY DEPARTMENT Provider Note   CSN: [id number] Arrival date & time: 10/04/17  2051     History   Chief Complaint Chief Complaint  Patient presents with  . Hematuria    HPI William Black is a 77 y.o. male.  The history is provided by the patient.  Hematuria  This is a new problem. The current episode started 12 to 24 hours ago. The problem occurs constantly. Progression since onset: waxing and waning. Pertinent negatives include no chest pain, no abdominal pain, no headaches and no shortness of breath. Nothing aggravates the symptoms. Nothing relieves the symptoms. He has tried nothing for the symptoms. The treatment provided no relief.  No f/c/r. No n/v/d.  No dysuria.   Past Medical History:  Diagnosis Date  . High cholesterol   . Hypertension     Patient Active Problem List   Diagnosis Date Noted  . Cellulitis 02/26/2017    History reviewed. No pertinent surgical history.      Home Medications    Prior to Admission medications   Medication Sig Start Date End Date Taking? Authorizing Provider  amoxicillin-clavulanate (AUGMENTIN) 875-125 MG tablet Take 1 tablet by mouth daily. 03/01/17   Regalado, Belkys A, MD  atorvastatin (LIPITOR) 20 MG tablet Take 20 mg by mouth at bedtime.  02/15/17   [provider]  diltiazem (CARDIZEM CD) 360 MG 24 hr capsule Take 360 mg by mouth daily. 02/15/17   [provider]  lisinopril (PRINIVIL,ZESTRIL) 40 MG tablet Take 40 mg by mouth 2 (two) times daily. 02/15/17   [provider]  Probiotic Product (PROBIOTIC-10 PO) Take by mouth.    [provider]  saccharomyces boulardii (FLORASTOR) 250 MG capsule Take 1 capsule (250 mg total) by mouth 2 (two) times daily. 03/01/17   Regalado, Belkys A, MD  tamsulosin (FLOMAX) 0.4 MG CAPS capsule TAKE 1 CAPSULE BY MOUTH ONCE DAILY 30 MINUTES AFTER THE SAME MEAL EACH DAY 08/23/17   [provider]    Family  History No family history on file.  Social History Social History   Tobacco Use  . Smoking status: Never Smoker  . Smokeless tobacco: Never Used  Substance Use Topics  . Alcohol use: No    Frequency: Never  . Drug use: No     Allergies   Patient has no known allergies.   Review of Systems Review of Systems  Constitutional: Negative for fever.  Respiratory: Negative for shortness of breath.   Cardiovascular: Negative for chest pain.  Gastrointestinal: Negative for abdominal pain and vomiting.  Genitourinary: Positive for hematuria. Negative for difficulty urinating, dysuria, enuresis, flank pain, frequency and urgency.  Neurological: Negative for headaches.  All other systems reviewed and are negative.    Physical Exam Updated Vital Signs BP (!) 154/84   Pulse 60   Temp 98.6 F (37 C) (Oral)   Resp 17   Ht 5\' 9"  (1.753 m)   Wt 68 kg (150 lb)   SpO2 95%   BMI 22.15 kg/m   Physical Exam  Constitutional: He is oriented to person, place, and time. He appears well-developed and well-nourished. No distress.  HENT:  Head: Normocephalic and atraumatic.  Mouth/Throat: No oropharyngeal exudate.  Eyes: Pupils are equal, round, and reactive to light. Conjunctivae are normal.  Neck: Normal range of motion. Neck supple.  Cardiovascular: Normal rate, regular rhythm, normal heart sounds and intact distal pulses.  Pulmonary/Chest: Effort normal and breath sounds normal. No stridor. He has no  wheezes. He has no rales.  Abdominal: Soft. Bowel sounds are normal. He exhibits no mass. There is no tenderness. There is no rebound and no guarding.  Musculoskeletal: Normal range of motion.  Neurological: He is alert and oriented to person, place, and time. He displays normal reflexes.  Skin: Skin is warm and dry. Capillary refill takes less than 2 seconds.  Psychiatric: He has a normal mood and affect.  Nursing note and vitals reviewed.    ED Treatments / Results  Labs (all labs  ordered are listed, but only abnormal results are displayed) Results for orders placed or performed during the hospital encounter of 10/04/17  CBC with Differential  Result Value Ref Range   WBC 5.8 4.0 - 10.5 K/uL   RBC 4.88 4.22 - 5.81 MIL/uL   Hemoglobin 16.1 13.0 - 17.0 g/dL   HCT 46.9 39.0 - 52.0 %   MCV 96.1 78.0 - 100.0 fL   MCH 33.0 26.0 - 34.0 pg   MCHC 34.3 30.0 - 36.0 g/dL   RDW 13.7 11.5 - 15.5 %   Platelets 140 (L) 150 - 400 K/uL   Neutrophils Relative % 32 %   Neutro Abs 1.9 1.7 - 7.7 K/uL   Lymphocytes Relative 52 %   Lymphs Abs 3.0 0.7 - 4.0 K/uL   Monocytes Relative 13 %   Monocytes Absolute 0.8 0.1 - 1.0 K/uL   Eosinophils Relative 2 %   Eosinophils Absolute 0.1 0.0 - 0.7 K/uL   Basophils Relative 1 %   Basophils Absolute 0.1 0.0 - 0.1 K/uL   Immature Granulocytes 0 %   Abs Immature Granulocytes 0.0 0.0 - 0.1 K/uL  Basic metabolic panel  Result Value Ref Range   Sodium 142 135 - 145 mmol/L   Potassium 3.7 3.5 - 5.1 mmol/L   Chloride 109 98 - 111 mmol/L   CO2 25 22 - 32 mmol/L   Glucose, Bld 106 (H) 70 - 99 mg/dL   BUN 12 8 - 23 mg/dL   Creatinine, Ser 0.97 0.61 - 1.24 mg/dL   Calcium 9.4 8.9 - 10.3 mg/dL   GFR calc non Af Amer >60 >60 mL/min   GFR calc Af Amer >60 >60 mL/min   Anion gap 8 5 - 15  Urinalysis, Routine w reflex microscopic  Result Value Ref Range   Color, Urine RED (A) YELLOW   APPearance CLOUDY (A) CLEAR   Specific Gravity, Urine 1.006 1.005 - 1.030   pH 6.0 5.0 - 8.0   Glucose, UA NEGATIVE NEGATIVE mg/dL   Hgb urine dipstick LARGE (A) NEGATIVE   Bilirubin Urine NEGATIVE NEGATIVE   Ketones, ur NEGATIVE NEGATIVE mg/dL   Protein, ur 30 (A) NEGATIVE mg/dL   Nitrite NEGATIVE NEGATIVE   Leukocytes, UA NEGATIVE NEGATIVE   RBC / HPF >50 (H) 0 - 5 RBC/hpf   WBC, UA 6-10 0 - 5 WBC/hpf   Bacteria, UA RARE (A) NONE SEEN  Sample to Blood Bank  Result Value Ref Range   Blood Bank Specimen SAMPLE AVAILABLE FOR TESTING    Sample Expiration       10/05/2017 Performed at [facility], [address] [place], [place] [address]    Ct Renal Stone Study  Result Date: 10/05/2017 CLINICAL DATA:  Hematuria and frequency EXAM: CT ABDOMEN AND PELVIS WITHOUT CONTRAST TECHNIQUE: Multidetector CT imaging of the abdomen and pelvis was performed following the standard protocol without IV contrast. COMPARISON:  08/01/2017 chest CT FINDINGS: Lower chest: Emphysema at the  lung bases. No acute consolidation or pleural effusion. The heart size is within normal limits. Small hiatal hernia. Hepatobiliary: Multiple calcified gallstones. No biliary dilatation. No focal hepatic abnormality. Pancreas: Unremarkable. No pancreatic ductal dilatation or surrounding inflammatory changes. Spleen: Normal in size without focal abnormality. Adrenals/Urinary Tract: Adrenal glands are within normal limits. No hydronephrosis. No ureteral stone. Stomach/Bowel: Stomach is within normal limits. Appendix not well seen but no right lower quadrant inflammatory process. Sigmoid colon diverticula without acute inflammation no evidence of bowel wall thickening, distention, or inflammatory changes. Vascular/Lymphatic: Moderate aortic atherosclerosis. No aneurysm. No significantly enlarged lymph nodes Reproductive: Enlarged prostate gland. Dilated prostatic urethra. Prostate calcification Other: Fat in the left inguinal canal.  No free air or free fluid Musculoskeletal: Degenerative changes at L5-S1. No acute osseous abnormality IMPRESSION: 1. Negative for hydronephrosis or ureteral stone. 2. Enlarged prostate with slightly enlarged bladder and dilated appearance of the prostatic urethra, question outlet obstruction. 3. Emphysema 4. Gallstones Electronically Signed   By: Donavan Foil M.D.   On: 10/05/2017 00:03    Radiology Ct Renal Stone Study  Result Date: 10/05/2017 CLINICAL DATA:  Hematuria and frequency EXAM: CT ABDOMEN AND PELVIS WITHOUT CONTRAST TECHNIQUE: Multidetector CT  imaging of the abdomen and pelvis was performed following the standard protocol without IV contrast. COMPARISON:  08/01/2017 chest CT FINDINGS: Lower chest: Emphysema at the lung bases. No acute consolidation or pleural effusion. The heart size is within normal limits. Small hiatal hernia. Hepatobiliary: Multiple calcified gallstones. No biliary dilatation. No focal hepatic abnormality. Pancreas: Unremarkable. No pancreatic ductal dilatation or surrounding inflammatory changes. Spleen: Normal in size without focal abnormality. Adrenals/Urinary Tract: Adrenal glands are within normal limits. No hydronephrosis. No ureteral stone. Stomach/Bowel: Stomach is within normal limits. Appendix not well seen but no right lower quadrant inflammatory process. Sigmoid colon diverticula without acute inflammation no evidence of bowel wall thickening, distention, or inflammatory changes. Vascular/Lymphatic: Moderate aortic atherosclerosis. No aneurysm. No significantly enlarged lymph nodes Reproductive: Enlarged prostate gland. Dilated prostatic urethra. Prostate calcification Other: Fat in the left inguinal canal.  No free air or free fluid Musculoskeletal: Degenerative changes at L5-S1. No acute osseous abnormality IMPRESSION: 1. Negative for hydronephrosis or ureteral stone. 2. Enlarged prostate with slightly enlarged bladder and dilated appearance of the prostatic urethra, question outlet obstruction. 3. Emphysema 4. Gallstones Electronically Signed   By: Donavan Foil M.D.   On: 10/05/2017 00:03    Procedures Procedures (including critical care time)  Medications Ordered in ED Medications  tamsulosin (FLOMAX) capsule 0.4 mg (has no administration in time range)  cephALEXin (KEFLEX) capsule 500 mg (has no administration in time range)      Final Clinical Impressions(s) / ED Diagnoses   Will start flomax and keflex, follow up with urology for ongoing care.  Return for pain, numbness, changes in vision or  speech, fevers >100.4 unrelieved by medication, shortness of breath, intractable vomiting, or diarrhea, abdominal pain, Inability to tolerate liquids or food, cough, altered mental status or any concerns. No signs of systemic illness or infection. The patient is nontoxic-appearing on exam and vital signs are within normal limits. Will refer to urology for microscopy hematuria as patient is asymptomatic.  I have reviewed the triage vital signs and the nursing notes. Pertinent labs &imaging results that were available during my care of the patient were reviewed by me and considered in my medical decision making (see chart for details).  After history, exam, and medical workup I feel the patient has been appropriately medically screened  and is safe for discharge home. Pertinent diagnoses were discussed with the patient. Patient was given return precautions.   Aubreanna Percle, MD 10/05/17 [id number]

## 2017-10-06 LAB — URINE CULTURE
Culture: NO GROWTH
Special Requests: NORMAL

## 2017-10-09 DIAGNOSIS — R351 Nocturia: Secondary | ICD-10-CM | POA: Diagnosis not present

## 2017-10-09 DIAGNOSIS — R310 Gross hematuria: Secondary | ICD-10-CM | POA: Diagnosis not present

## 2017-11-07 DIAGNOSIS — N401 Enlarged prostate with lower urinary tract symptoms: Secondary | ICD-10-CM | POA: Diagnosis not present

## 2017-11-07 DIAGNOSIS — R310 Gross hematuria: Secondary | ICD-10-CM | POA: Diagnosis not present

## 2017-11-07 DIAGNOSIS — R351 Nocturia: Secondary | ICD-10-CM | POA: Diagnosis not present

## 2018-01-19 DIAGNOSIS — N401 Enlarged prostate with lower urinary tract symptoms: Secondary | ICD-10-CM | POA: Diagnosis not present

## 2018-01-19 DIAGNOSIS — I10 Essential (primary) hypertension: Secondary | ICD-10-CM | POA: Diagnosis not present

## 2018-01-19 DIAGNOSIS — E782 Mixed hyperlipidemia: Secondary | ICD-10-CM | POA: Diagnosis not present

## 2018-01-28 ENCOUNTER — Emergency Department (HOSPITAL_COMMUNITY): Payer: Medicare HMO

## 2018-01-28 ENCOUNTER — Inpatient Hospital Stay (HOSPITAL_COMMUNITY)
Admission: EM | Admit: 2018-01-28 | Discharge: 2018-01-31 | DRG: 419 | Disposition: A | Discharge status: Home / Self Care | Payer: Medicare HMO | Attending: Surgery | Admitting: Surgery

## 2018-01-28 ENCOUNTER — Other Ambulatory Visit: Payer: Self-pay

## 2018-01-28 ENCOUNTER — Encounter (HOSPITAL_COMMUNITY): Payer: Self-pay

## 2018-01-28 DIAGNOSIS — R1013 Epigastric pain: Secondary | ICD-10-CM | POA: Diagnosis not present

## 2018-01-28 DIAGNOSIS — R932 Abnormal findings on diagnostic imaging of liver and biliary tract: Secondary | ICD-10-CM | POA: Diagnosis not present

## 2018-01-28 DIAGNOSIS — K8050 Calculus of bile duct without cholangitis or cholecystitis without obstruction: Secondary | ICD-10-CM

## 2018-01-28 DIAGNOSIS — R079 Chest pain, unspecified: Secondary | ICD-10-CM | POA: Diagnosis not present

## 2018-01-28 DIAGNOSIS — K8020 Calculus of gallbladder without cholecystitis without obstruction: Secondary | ICD-10-CM | POA: Diagnosis not present

## 2018-01-28 DIAGNOSIS — Z87442 Personal history of urinary calculi: Secondary | ICD-10-CM

## 2018-01-28 DIAGNOSIS — K8067 Calculus of gallbladder and bile duct with acute and chronic cholecystitis with obstruction: Principal | ICD-10-CM | POA: Diagnosis present

## 2018-01-28 DIAGNOSIS — R1011 Right upper quadrant pain: Secondary | ICD-10-CM | POA: Diagnosis not present

## 2018-01-28 DIAGNOSIS — E785 Hyperlipidemia, unspecified: Secondary | ICD-10-CM | POA: Diagnosis present

## 2018-01-28 DIAGNOSIS — K8010 Calculus of gallbladder with chronic cholecystitis without obstruction: Secondary | ICD-10-CM | POA: Diagnosis present

## 2018-01-28 DIAGNOSIS — K8066 Calculus of gallbladder and bile duct with acute and chronic cholecystitis without obstruction: Secondary | ICD-10-CM | POA: Diagnosis not present

## 2018-01-28 DIAGNOSIS — R109 Unspecified abdominal pain: Secondary | ICD-10-CM | POA: Diagnosis not present

## 2018-01-28 DIAGNOSIS — I10 Essential (primary) hypertension: Secondary | ICD-10-CM | POA: Diagnosis present

## 2018-01-28 DIAGNOSIS — K810 Acute cholecystitis: Secondary | ICD-10-CM

## 2018-01-28 DIAGNOSIS — Z79899 Other long term (current) drug therapy: Secondary | ICD-10-CM

## 2018-01-28 DIAGNOSIS — F17210 Nicotine dependence, cigarettes, uncomplicated: Secondary | ICD-10-CM | POA: Diagnosis present

## 2018-01-28 DIAGNOSIS — K4090 Unilateral inguinal hernia, without obstruction or gangrene, not specified as recurrent: Secondary | ICD-10-CM | POA: Diagnosis present

## 2018-01-28 HISTORY — DX: Calculus of kidney: N20.0

## 2018-01-28 LAB — HEPATIC FUNCTION PANEL
ALBUMIN: 4.4 g/dL (ref 3.5–5.0)
ALT: 33 U/L (ref 0–44)
AST: 34 U/L (ref 15–41)
Alkaline Phosphatase: 67 U/L (ref 38–126)
BILIRUBIN DIRECT: 0.2 mg/dL (ref 0.0–0.2)
Indirect Bilirubin: 0.8 mg/dL (ref 0.3–0.9)
Total Bilirubin: 1 mg/dL (ref 0.3–1.2)
Total Protein: 7.4 g/dL (ref 6.5–8.1)

## 2018-01-28 LAB — CBC
HEMATOCRIT: 49.9 % (ref 39.0–52.0)
HEMOGLOBIN: 17.1 g/dL — AB (ref 13.0–17.0)
MCH: 32.5 pg (ref 26.0–34.0)
MCHC: 34.3 g/dL (ref 30.0–36.0)
MCV: 94.9 fL (ref 80.0–100.0)
Platelets: 142 10*3/uL — ABNORMAL LOW (ref 150–400)
RBC: 5.26 MIL/uL (ref 4.22–5.81)
RDW: 13.3 % (ref 11.5–15.5)
WBC: 7 10*3/uL (ref 4.0–10.5)
nRBC: 0 % (ref 0.0–0.2)

## 2018-01-28 LAB — I-STAT TROPONIN, ED
Troponin i, poc: 0.01 ng/mL (ref 0.00–0.08)
Troponin i, poc: 0 ng/mL (ref 0.00–0.08)

## 2018-01-28 LAB — BASIC METABOLIC PANEL
Anion gap: 11 (ref 5–15)
BUN: 9 mg/dL (ref 8–23)
CHLORIDE: 104 mmol/L (ref 98–111)
CO2: 22 mmol/L (ref 22–32)
CREATININE: 0.89 mg/dL (ref 0.61–1.24)
Calcium: 9.8 mg/dL (ref 8.9–10.3)
GFR calc Af Amer: >60 mL/min (ref >60)
GFR calc non Af Amer: >60 mL/min (ref >60)
Glucose, Bld: 132 mg/dL — ABNORMAL HIGH (ref 70–99)
POTASSIUM: 3.3 mmol/L — AB (ref 3.5–5.1)
Sodium: 137 mmol/L (ref 135–145)

## 2018-01-28 LAB — LIPASE, BLOOD: LIPASE: 32 U/L (ref 11–51)

## 2018-01-28 MED ORDER — MORPHINE SULFATE (PF) 2 MG/ML IV SOLN
2.0000 mg | Freq: Once | INTRAVENOUS | Status: AC
  Administered 2018-01-28: 2 mg via INTRAVENOUS
  Filled 2018-01-28: qty 1

## 2018-01-28 MED ORDER — DEXTROSE-NACL 5-0.45 % IV SOLN
INTRAVENOUS | Status: DC
  Administered 2018-01-28 – 2018-01-30 (×4): via INTRAVENOUS

## 2018-01-28 MED ORDER — ENOXAPARIN SODIUM 40 MG/0.4ML ~~LOC~~ SOLN
40.0000 mg | SUBCUTANEOUS | Status: DC
  Administered 2018-01-28: 40 mg via SUBCUTANEOUS
  Filled 2018-01-28: qty 0.4

## 2018-01-28 MED ORDER — FAMOTIDINE IN NACL 20-0.9 MG/50ML-% IV SOLN
20.0000 mg | Freq: Once | INTRAVENOUS | Status: AC
  Administered 2018-01-28: 20 mg via INTRAVENOUS
  Filled 2018-01-28: qty 50

## 2018-01-28 MED ORDER — MORPHINE SULFATE (PF) 4 MG/ML IV SOLN
4.0000 mg | Freq: Once | INTRAVENOUS | Status: AC
Start: 2018-01-28 — End: 2018-01-28
  Administered 2018-01-28: 4 mg via INTRAVENOUS
  Filled 2018-01-28: qty 1

## 2018-01-28 MED ORDER — METOPROLOL TARTRATE 5 MG/5ML IV SOLN: 5.0000 mg | Freq: Four times a day (QID) | INTRAVENOUS | Status: DC | PRN

## 2018-01-28 MED ORDER — ONDANSETRON 4 MG PO TBDP: 4.0000 mg | ORAL_TABLET | Freq: Four times a day (QID) | ORAL | Status: DC | PRN

## 2018-01-28 MED ORDER — ALUM & MAG HYDROXIDE-SIMETH 200-200-20 MG/5ML PO SUSP
30.0000 mL | Freq: Once | ORAL | Status: AC
  Administered 2018-01-28: 30 mL via ORAL
  Filled 2018-01-28: qty 30

## 2018-01-28 MED ORDER — DIPHENHYDRAMINE HCL 12.5 MG/5ML PO ELIX: 12.5000 mg | ORAL_SOLUTION | Freq: Four times a day (QID) | ORAL | Status: DC | PRN

## 2018-01-28 MED ORDER — MORPHINE SULFATE (PF) 2 MG/ML IV SOLN
2.0000 mg | INTRAVENOUS | Status: DC | PRN
  Administered 2018-01-29 – 2018-01-30 (×3): 2 mg via INTRAVENOUS
  Filled 2018-01-28 (×3): qty 1

## 2018-01-28 MED ORDER — ONDANSETRON HCL 4 MG/2ML IJ SOLN
4.0000 mg | Freq: Four times a day (QID) | INTRAMUSCULAR | Status: DC | PRN
  Administered 2018-01-30: 4 mg via INTRAVENOUS
  Filled 2018-01-28: qty 2

## 2018-01-28 MED ORDER — ACETAMINOPHEN 650 MG RE SUPP: 650.0000 mg | Freq: Four times a day (QID) | RECTAL | Status: DC | PRN

## 2018-01-28 MED ORDER — SODIUM CHLORIDE 0.9 % IV SOLN
2.0000 g | INTRAVENOUS | Status: DC
  Administered 2018-01-28 – 2018-01-30 (×3): 2 g via INTRAVENOUS
  Filled 2018-01-28 (×3): qty 20

## 2018-01-28 MED ORDER — DIPHENHYDRAMINE HCL 50 MG/ML IJ SOLN: 12.5000 mg | Freq: Four times a day (QID) | INTRAMUSCULAR | Status: DC | PRN

## 2018-01-28 MED ORDER — MORPHINE SULFATE (PF) 2 MG/ML IV SOLN
2.0000 mg | Freq: Once | INTRAVENOUS | Status: AC
Start: 2018-01-28 — End: 2018-01-28
  Administered 2018-01-28: 2 mg via INTRAVENOUS
  Filled 2018-01-28: qty 1

## 2018-01-28 MED ORDER — MORPHINE SULFATE (PF) 4 MG/ML IV SOLN
4.0000 mg | Freq: Once | INTRAVENOUS | Status: AC
  Administered 2018-01-28: 4 mg via INTRAVENOUS
  Filled 2018-01-28: qty 1

## 2018-01-28 MED ORDER — LIDOCAINE VISCOUS HCL 2 % MT SOLN
15.0000 mL | Freq: Once | OROMUCOSAL | Status: AC
  Administered 2018-01-28: 15 mL via ORAL
  Filled 2018-01-28: qty 15

## 2018-01-28 MED ORDER — OXYCODONE HCL 5 MG PO TABS
5.0000 mg | ORAL_TABLET | ORAL | Status: DC | PRN
  Administered 2018-01-29 – 2018-01-30 (×2): 5 mg via ORAL
  Filled 2018-01-28 (×2): qty 1

## 2018-01-28 MED ORDER — ACETAMINOPHEN 325 MG PO TABS: 650.0000 mg | ORAL_TABLET | Freq: Four times a day (QID) | ORAL | Status: DC | PRN

## 2018-01-28 MED ORDER — IOHEXOL 300 MG/ML  SOLN
100.0000 mL | Freq: Once | INTRAMUSCULAR | Status: AC | PRN
  Administered 2018-01-28: 100 mL via INTRAVENOUS

## 2018-01-28 NOTE — ED Notes (Signed)
Attempted to call report, RN will return call to ED, direct number provided.

## 2018-01-28 NOTE — ED Provider Notes (Signed)
Transfer of Care Note I assumed care of patient from [place] at 1600.  Agree with history, physical exam and plan.  See their note for further details.    Briefly, the patient is a 77 yo male with PMHx of HTN and HLD who presents with right upper quadrant/epigastric abdominal pain that started earlier this morning approximately 0100.  Patient states that he has had pain like this before that usually resolves with Tums/ginger ale.  However his pain was persistent despite these interventions as result patient came to the emergency department for further evaluation.  Patient's work-up thus far has showed gallstones on his CT as well as his ultrasound, with one stone located in the gallbladder neck.  Patient's laboratory values are overall reassuring.  Current plan is as follows: At this time patient has just received another dose of pain medication.  We will reassess the patient and if he is feeling better will likely discharge with outpatient follow-up with surgery.  If he continues to remain symptomatic I will speak with the on-call surgeon regarding patient's case.  Reassessment: I personally reassessed the patient.  He continues to complain of abdominal pain.  As result I spoke with the general surgeon on-call.  After reviewing patient's case the general surgeon accepted patient to his service for admission and possible cholecystectomy tomorrow morning.  I gave patient more pain medication while in the emergency department secondary to his symptoms.  For further information regarding patient's continued hospital course please see admitting physician's documentation.  The care of this patient was supervised by Dr. Laverta Baltimore, who agreed with the plan and management of the patient.   Clinical Impression: 1. Symptomatic cholelithiasis   2. Abdominal pain, epigastric     ED Dispo: Admit    Sanford Lindblad, Chanda Busing, MD 01/28/18 2208    Margette Fast, MD 01/29/18 [phone]

## 2018-01-28 NOTE — ED Triage Notes (Signed)
Pt arrives from home with c/io abdominal pain radiating to mid chest and back that started at 0100. Pt states he took 2 Tums and gingerale but norelief.  Pt also states short episodes of dizziness over last week.

## 2018-01-28 NOTE — ED Provider Notes (Signed)
[facility] Provider Note   CSN: [id number] Arrival date & time: 01/28/18  0915     History   Chief Complaint Chief Complaint  Patient presents with  . Abdominal Pain  . Chest Pain  . Back Pain    HPI William Black is a 77 y.o. male with a PMH of HTN and HLD who presents with constant epigastric abdominal pain that radiates to the middle of his chest and mid thoracic back onset 1am today. Patient reports he has had pain like this before, but it usually resolves with Tums and Ginger Ale. Patient states he tried taking Tums and Ginger Ale, but pain did not improve. Patient reports nothing makes the pain better or worse. Patient describes pain as a "gas pain and pressure." Patient denies shortness of breath, palpitations, or extremity edema. Patient denies nausea, vomiting, or diarrhea. Last BM was this morning. Patient denies recent surgeries or immobility. Patient reports post nasal drip, rhinorrhea, and sneezing due to environmental allergies. Patient denies fever. Patient reports smoking cigarettes daily.   HPI  Past Medical History:  Diagnosis Date  . High cholesterol   . Hypertension   . Kidney stones   . Kidney stones     Patient Active Problem List   Diagnosis Date Noted  . Cellulitis 02/26/2017    Past Surgical History:  Procedure Laterality Date  . APPENDECTOMY    . CYSTOSCOPY    . DENTAL SURGERY          Home Medications    Prior to Admission medications   Medication Sig Start Date End Date Taking? Authorizing Provider  atorvastatin (LIPITOR) 20 MG tablet Take 20 mg by mouth at bedtime.  02/15/17  Yes [provider]  diltiazem (CARDIZEM CD) 360 MG 24 hr capsule Take 360 mg by mouth daily. 02/15/17  Yes [provider]  lisinopril (PRINIVIL,ZESTRIL) 40 MG tablet Take 40 mg by mouth 2 (two) times daily. 02/15/17  Yes [provider]  amoxicillin-clavulanate (AUGMENTIN) 875-125 MG tablet Take  1 tablet by mouth daily. Patient not taking: Reported on 01/28/2018 03/01/17   Regalado, Jerald Kief A, MD  cephALEXin (KEFLEX) 500 MG capsule Take 1 capsule (500 mg total) by mouth 4 (four) times daily. Patient not taking: Reported on 01/28/2018 10/05/17   Palumbo, April, MD  saccharomyces boulardii (FLORASTOR) 250 MG capsule Take 1 capsule (250 mg total) by mouth 2 (two) times daily. Patient not taking: Reported on 01/28/2018 03/01/17   Elmarie Shiley, MD    Family History No family history on file.  Social History Social History   Tobacco Use  . Smoking status: Current Every Day Smoker  . Smokeless tobacco: Never Used  Substance Use Topics  . Alcohol use: No    Frequency: Never  . Drug use: No     Allergies   Patient has no known allergies.   Review of Systems Review of Systems  Constitutional: Negative for diaphoresis, fatigue and fever.  HENT: Positive for postnasal drip, rhinorrhea and sneezing.   Respiratory: Negative for cough, chest tightness, shortness of breath and wheezing.   Cardiovascular: Positive for chest pain. Negative for palpitations and leg swelling.  Gastrointestinal: Negative for abdominal pain, constipation, diarrhea, nausea and vomiting.  Endocrine: Negative for cold intolerance and heat intolerance.  Genitourinary: Negative for dysuria.  Musculoskeletal: Positive for back pain.  Skin: Negative for pallor and rash.  Allergic/Immunologic: Positive for environmental allergies. Negative for immunocompromised state.  Neurological: Negative for dizziness, syncope, weakness,  light-headedness and numbness.  Psychiatric/Behavioral: Negative for agitation and behavioral problems. The patient is not nervous/anxious.      Physical Exam Updated Vital Signs BP (!) 150/84 (BP Location: Right Arm)   Pulse (!) 55   Temp 98.3 F (36.8 C) (Oral)   Resp 14   Ht 5\' 9"  (1.753 m)   Wt 64.9 kg   SpO2 98%   BMI 21.12 kg/m   Physical Exam  Constitutional: He  appears well-developed and well-nourished. No distress.  HENT:  Head: Normocephalic and atraumatic.  Neck: Normal range of motion. Neck supple. No JVD present.  Cardiovascular: Normal rate, regular rhythm, normal heart sounds, intact distal pulses and normal pulses. Exam reveals no gallop and no friction rub.  No murmur heard. Pulses:      Radial pulses are 2+ on the right side, and 2+ on the left side.       Dorsalis pedis pulses are 2+ on the right side, and 2+ on the left side.  Pulmonary/Chest: Effort normal and breath sounds normal. No respiratory distress. He has no wheezes. He has no rales. He exhibits no tenderness.  Abdominal: Soft. There is no tenderness. There is no rigidity, no rebound, no guarding and no CVA tenderness.  Musculoskeletal: Normal range of motion.  Neurological: He is alert.  Skin: Capillary refill takes less than 2 seconds. No rash noted. He is not diaphoretic. No pallor.  Psychiatric: He has a normal mood and affect.  Nursing note and vitals reviewed.    ED Treatments / Results  Labs (all labs ordered are listed, but only abnormal results are displayed) Labs Reviewed  BASIC METABOLIC PANEL - Abnormal; Notable for the following components:      Result Value   Potassium 3.3 (*)    Glucose, Bld 132 (*)    All other components within normal limits  CBC - Abnormal; Notable for the following components:   Hemoglobin 17.1 (*)    Platelets 142 (*)    All other components within normal limits  LIPASE, BLOOD  HEPATIC FUNCTION PANEL  I-STAT TROPONIN, ED  I-STAT TROPONIN, ED    EKG EKG Interpretation  Date/Time:  Sunday January 28 2018 09:42:15 EST Ventricular Rate:  58 PR Interval:    QRS Duration: 95 QT Interval:  438 QTC Calculation: 431 R Axis:   80 Text Interpretation:  Sinus rhythm Atrial premature complex Anteroseptal infarct, age indeterminate No old tracing to compare Confirmed by Isla Pence [phone]) on 01/28/2018 10:01:30  AM   Radiology Ct Abdomen Pelvis W Contrast  Result Date: 01/28/2018 CLINICAL DATA:  Diffuse abdominal pain extending into the chest since 1 a.m. today. Smoker. EXAM: CT ABDOMEN AND PELVIS WITH CONTRAST TECHNIQUE: Multidetector CT imaging of the abdomen and pelvis was performed using the standard protocol following bolus administration of intravenous contrast. CONTRAST:  131mL OMNIPAQUE IOHEXOL 300 MG/ML  SOLN COMPARISON:  10/04/2017. FINDINGS: Lower chest: The lungs remain hyperexpanded with stable linear scar in the right lower lobe. Hepatobiliary: Multiple gallstones in the gallbladder measuring up to 10 mm in maximum diameter each. No gallbladder wall thickening or pericholecystic fluid. Normal appearing liver. Pancreas: Unremarkable. No pancreatic ductal dilatation or surrounding inflammatory changes. Spleen: Normal in size without focal abnormality. Adrenals/Urinary Tract: Adrenal glands are unremarkable. Kidneys are normal, without renal calculi, focal lesion, or hydronephrosis. Bladder is unremarkable. Stomach/Bowel: Multiple sigmoid and descending colon diverticula without evidence of diverticulitis. Surgically absent appendix. Unremarkable stomach and small bowel. Vascular/Lymphatic: Atheromatous arterial calcifications without aneurysm. No enlarged lymph  nodes. Reproductive: Moderately enlarged prostate gland. Other: Moderate-sized left inguinal hernia containing fat. Musculoskeletal: L5-S1 degenerative changes. IMPRESSION: 1. No acute abnormality. 2. Cholelithiasis. 3. Sigmoid and descending colon diverticulosis. 4. Moderately enlarged prostate gland. 5. Moderate-sized left inguinal hernia containing fat. 6. COPD. Electronically Signed   By: Claudie Revering M.D.   On: 01/28/2018 12:12   Dg Chest Port 1 View  Result Date: 01/28/2018 CLINICAL DATA:  Abdominal pain radiating to chest and back, dizziness EXAM: PORTABLE CHEST 1 VIEW COMPARISON:  Low-dose lung cancer CT chest dated 08/01/2017  FINDINGS: Lungs are clear. No pleural effusion or pneumothorax. The heart is normal in size. IMPRESSION: No evidence of acute cardiopulmonary disease. Electronically Signed   By: Julian Hy M.D.   On: 01/28/2018 10:31   US Abdomen Limited Ruq  Result Date: 01/28/2018 CLINICAL DATA:  Epigastric abdominal pain. EXAM: ULTRASOUND ABDOMEN LIMITED RIGHT UPPER QUADRANT COMPARISON:  CT abdomen pelvis from same day. FINDINGS: Gallbladder: Distended gallbladder containing multiple gallstones. There is a stone in the gallbladder neck near the cystic duct. No wall thickening. No sonographic Murphy sign noted by sonographer. Common bile duct: Diameter: 7 mm, at the upper limits of normal. Liver: No focal lesion identified. Within normal limits in parenchymal echogenicity. Portal vein is patent on color Doppler imaging with normal direction of blood flow towards the liver. IMPRESSION: 1. Cholelithiasis, including a gallstone in the gallbladder neck near the cystic duct. The gallbladder is distended, but there is otherwise no sonographic evidence of acute cholecystitis at this time. Electronically Signed   By: Titus Dubin M.D.   On: 01/28/2018 16:07    Procedures Procedures (including critical care time)  Medications Ordered in ED Medications  alum & mag hydroxide-simeth (MAALOX/MYLANTA) 200-200-20 MG/5ML suspension 30 mL (30 mLs Oral Given 01/28/18 1030)    And  lidocaine (XYLOCAINE) 2 % viscous mouth solution 15 mL (15 mLs Oral Given 01/28/18 1029)  famotidine (PEPCID) IVPB 20 mg premix (0 mg Intravenous Stopped 01/28/18 1101)  morphine 2 MG/ML injection 2 mg (2 mg Intravenous Given 01/28/18 1133)  iohexol (OMNIPAQUE) 300 MG/ML solution 100 mL (100 mLs Intravenous Contrast Given 01/28/18 1143)  morphine 2 MG/ML injection 2 mg (2 mg Intravenous Given 01/28/18 1352)  morphine 4 MG/ML injection 4 mg (4 mg Intravenous Given 01/28/18 1607)     Initial Impression / Assessment and Plan / ED Course  I  have reviewed the triage vital signs and the nursing notes.  Pertinent labs & imaging results that were available during my care of the patient were reviewed by me and considered in my medical decision making (see chart for details).  Clinical Course as of Jan 28 1618  Sun Jan 28, 2018  1045 Normal CXR. Suspect pneumonia is unlikely.   DG Chest Port 1 View [AH]  1051 Lipase is within normal limits. Do not suspect pancreatitis at this time.   Lipase, blood [AH]  1052 Mild hypokalemia. Will encourage patient to make dietary changes and increase potassium intake.   Potassium(!): 3.3 [AH]  1127 Patient reports he is still having epigastric pain. Will treat pain with morphine.    [AH]  1129 Hepatic function panel is unremarkable.   Hepatic function panel [AH]  1215 Multiple gallstones noted on CT.  CT Abdomen Pelvis W Contrast [AH]  7353 Patient reports he is still having epigastric pain. Will provide more pain control.    [AH]  1407 Patient reports pain is still constant and not alleviated. Will order RUQ ultrasound to  further assess gallbladder.    [AH]  1429 Second troponin is negative suggesting symptoms are likely not due to a cardiac cause.   I-stat troponin, ED [AH]  1555 Provided more pain medicine since patient continues to experience pain.    [AH]  1612 Cholelithiasis present near neck of cystic duct.   US Abdomen Limited RUQ [AH]    Clinical Course User Index [AH] Arville Lime, PA-C   Patient presents with complaint of epigastric abdominal pain. Patient nontoxic appearing, in no apparent distress, vitals WNL, stable.  Labs/imaging: Ordered CXR, EKG, and troponin to evaluate chest pain. Ordered Hepatic function panel, lipase, BMP, and CBC due to epigastric/RUQ abdominal pain to evaluate for pancreatitis, hepatic dysfunction, and electrolyte abnormalities. Order CT abdomen that revealed cholelithiasis. Ordered RUQ ultrasound to evaluate gallbladder further due to persistent  symptoms.   Therapeutics: Provided Maalox/Mylanta, Xylocaine, and Pepcid to help alleviate epigastric pain. GI cocktail was not successful. Provided Morphine for pain control.  Assessment/Plan: Suspect symptoms are likely due to cholelithiasis due to symptoms and workup. Provided more morphine for pain control. Patient's pain will be monitored longer and the patient will be evaluated again. Surgery may have to be consulted if symptoms persist and pain is not adequately controlled.   Chest pain is not likely of cardiac or pulmonary etiology d/t presentation, PERC negative, VSS, no tracheal deviation, no JVD or new murmur, RRR, breath sounds equal bilaterally, EKG without acute abnormalities, negative troponin, and negative CXR.  Case has been discussed with and seen by Dr. Gilford Raid who agrees with the plan above.  At shift change care was transferred to Dr. Lavell Islam who will follow pending studies, re-evaluate and determine disposition.    Final Clinical Impressions(s) / ED Diagnoses   Final diagnoses:  Abdominal pain, epigastric    ED Discharge Orders    None       Arville Lime, [place] 01/28/18 1619    Isla Pence, MD 01/29/18 [phone]

## 2018-01-28 NOTE — ED Notes (Signed)
Patient transported to CT 

## 2018-01-28 NOTE — ED Notes (Signed)
Pt currently in ultrasound, will round on patient upon return to the department

## 2018-01-28 NOTE — H&P (Signed)
William Black is an 77 y.o. male.   Chief Complaint: abdominal pain HPI: 77 yo male with 1 day of right upper quadrant pain. Pain woke him from sleep. He has nausea but no vomiting. The pain is constant. He denies fevers and chills. He has never had this type of pain before.   Past Medical History:  Diagnosis Date  . High cholesterol   . Hypertension   . Kidney stones   . Kidney stones     Past Surgical History:  Procedure Laterality Date  . APPENDECTOMY    . CYSTOSCOPY    . DENTAL SURGERY      No family history on file. Social History:  reports that he has been smoking. He has never used smokeless tobacco. He reports that he does not drink alcohol or use drugs.  Allergies: No Known Allergies   (Not in a hospital admission)  Results for orders placed or performed during the hospital encounter of 01/28/18 (from the past 48 hour(s))  Basic metabolic panel     Status: Abnormal   Collection Time: 01/28/18  9:44 AM  Result Value Ref Range   Sodium 137 135 - 145 mmol/L   Potassium 3.3 (L) 3.5 - 5.1 mmol/L   Chloride 104 98 - 111 mmol/L   CO2 22 22 - 32 mmol/L   Glucose, Bld 132 (H) 70 - 99 mg/dL   BUN 9 8 - 23 mg/dL   Creatinine, Ser 0.89 0.61 - 1.24 mg/dL   Calcium 9.8 8.9 - 10.3 mg/dL   GFR calc non Af Amer >60 >60 mL/min   GFR calc Af Amer >60 >60 mL/min    Comment: (NOTE) The eGFR has been calculated using the CKD EPI equation. This calculation has not been validated in all clinical situations. eGFR's persistently <60 mL/min signify possible Chronic Kidney Disease.    Anion gap 11 5 - 15    Comment: Performed at [facility] [address], [place], [place] [address]  CBC     Status: Abnormal   Collection Time: 01/28/18  9:44 AM  Result Value Ref Range   WBC 7.0 4.0 - 10.5 K/uL   RBC 5.26 4.22 - 5.81 MIL/uL   Hemoglobin 17.1 (H) 13.0 - 17.0 g/dL   HCT 49.9 39.0 - 52.0 %   MCV 94.9 80.0 - 100.0 fL   MCH 32.5 26.0 - 34.0 pg   MCHC 34.3 30.0 - 36.0 g/dL    RDW 13.3 11.5 - 15.5 %   Platelets 142 (L) 150 - 400 K/uL   nRBC 0.0 0.0 - 0.2 %    Comment: Performed at [facility], [place] [address], [place], [place] [address]  Lipase, blood     Status: None   Collection Time: 01/28/18  9:44 AM  Result Value Ref Range   Lipase 32 11 - 51 U/L    Comment: Performed at [facility] [address] [place], [place] [address]  Hepatic function panel     Status: None   Collection Time: 01/28/18  9:44 AM  Result Value Ref Range   Total Protein 7.4 6.5 - 8.1 g/dL   Albumin 4.4 3.5 - 5.0 g/dL   AST 34 15 - 41 U/L   ALT 33 0 - 44 U/L   Alkaline Phosphatase 67 38 - 126 U/L   Total Bilirubin 1.0 0.3 - 1.2 mg/dL   Bilirubin, Direct 0.2 0.0 - 0.2 mg/dL   Indirect Bilirubin 0.8 0.3 - 0.9  mg/dL    Comment: Performed at [facility], [place] [address] [place], [place] [address]  I-stat troponin, ED     Status: None   Collection Time: 01/28/18  9:55 AM  Result Value Ref Range   Troponin i, poc 0.01 0.00 - 0.08 ng/mL   Comment 3            Comment: Due to the release kinetics of cTnI, a negative result within the first hours of the onset of symptoms does not rule out myocardial infarction with certainty. If myocardial infarction is still suspected, repeat the test at appropriate intervals.   I-stat troponin, ED     Status: None   Collection Time: 01/28/18  2:04 PM  Result Value Ref Range   Troponin i, poc 0.00 0.00 - 0.08 ng/mL   Comment 3            Comment: Due to the release kinetics of cTnI, a negative result within the first hours of the onset of symptoms does not rule out myocardial infarction with certainty. If myocardial infarction is still suspected, repeat the test at appropriate intervals.    Ct Abdomen Pelvis W Contrast  Result Date: 01/28/2018 CLINICAL DATA:  Diffuse abdominal pain extending into the chest since 1 a.m. today. Smoker. EXAM: CT ABDOMEN AND PELVIS WITH CONTRAST TECHNIQUE: Multidetector CT imaging of the  abdomen and pelvis was performed using the standard protocol following bolus administration of intravenous contrast. CONTRAST:  173m OMNIPAQUE IOHEXOL 300 MG/ML  SOLN COMPARISON:  10/04/2017. FINDINGS: Lower chest: The lungs remain hyperexpanded with stable linear scar in the right lower lobe. Hepatobiliary: Multiple gallstones in the gallbladder measuring up to 10 mm in maximum diameter each. No gallbladder wall thickening or pericholecystic fluid. Normal appearing liver. Pancreas: Unremarkable. No pancreatic ductal dilatation or surrounding inflammatory changes. Spleen: Normal in size without focal abnormality. Adrenals/Urinary Tract: Adrenal glands are unremarkable. Kidneys are normal, without renal calculi, focal lesion, or hydronephrosis. Bladder is unremarkable. Stomach/Bowel: Multiple sigmoid and descending colon diverticula without evidence of diverticulitis. Surgically absent appendix. Unremarkable stomach and small bowel. Vascular/Lymphatic: Atheromatous arterial calcifications without aneurysm. No enlarged lymph nodes. Reproductive: Moderately enlarged prostate gland. Other: Moderate-sized left inguinal hernia containing fat. Musculoskeletal: L5-S1 degenerative changes. IMPRESSION: 1. No acute abnormality. 2. Cholelithiasis. 3. Sigmoid and descending colon diverticulosis. 4. Moderately enlarged prostate gland. 5. Moderate-sized left inguinal hernia containing fat. 6. COPD. Electronically Signed   By: SClaudie ReveringM.D.   On: 01/28/2018 12:12   Dg Chest Port 1 View  Result Date: 01/28/2018 CLINICAL DATA:  Abdominal pain radiating to chest and back, dizziness EXAM: PORTABLE CHEST 1 VIEW COMPARISON:  Low-dose lung cancer CT chest dated 08/01/2017 FINDINGS: Lungs are clear. No pleural effusion or pneumothorax. The heart is normal in size. IMPRESSION: No evidence of acute cardiopulmonary disease. Electronically Signed   By: SJulian HyM.D.   On: 01/28/2018 10:31   [place]oreaAbdomen Limited Ruq  Result  Date: 01/28/2018 CLINICAL DATA:  Epigastric abdominal pain. EXAM: ULTRASOUND ABDOMEN LIMITED RIGHT UPPER QUADRANT COMPARISON:  CT abdomen pelvis from same day. FINDINGS: Gallbladder: Distended gallbladder containing multiple gallstones. There is a stone in the gallbladder neck near the cystic duct. No wall thickening. No sonographic Murphy sign noted by sonographer. Common bile duct: Diameter: 7 mm, at the upper limits of normal. Liver: No focal lesion identified. Within normal limits in parenchymal echogenicity. Portal vein is patent on color Doppler imaging with normal direction of blood flow towards the liver. IMPRESSION:  1. Cholelithiasis, including a gallstone in the gallbladder neck near the cystic duct. The gallbladder is distended, but there is otherwise no sonographic evidence of acute cholecystitis at this time. Electronically Signed   By: Titus Dubin M.D.   On: 01/28/2018 16:07    Review of Systems  Constitutional: Negative for chills and fever.  HENT: Negative for hearing loss.   Eyes: Negative for blurred vision and double vision.  Respiratory: Negative for cough and hemoptysis.   Cardiovascular: Negative for chest pain and palpitations.  Gastrointestinal: Positive for abdominal pain and nausea. Negative for vomiting.  Genitourinary: Negative for dysuria and urgency.  Musculoskeletal: Negative for myalgias and neck pain.  Skin: Negative for itching and rash.  Neurological: Negative for dizziness, tingling and headaches.  Endo/Heme/Allergies: Does not bruise/bleed easily.  Psychiatric/Behavioral: Negative for depression and suicidal ideas.    Blood pressure (!) 141/80, pulse 60, temperature 98.3 F (36.8 C), temperature source Oral, resp. rate 14, height _0  (1.753 m), weight 64.9 kg, SpO2 97 %. Physical Exam  Vitals reviewed. Constitutional: He is oriented to person, place, and time. He appears well-developed and well-nourished.  HENT:  Head: Normocephalic and atraumatic.   Eyes: Pupils are equal, round, and reactive to light. Conjunctivae and EOM are normal.  Neck: Normal range of motion. Neck supple.  Cardiovascular: Normal rate and regular rhythm.  Respiratory: Effort normal and breath sounds normal.  GI: Soft. Bowel sounds are normal. He exhibits no distension. There is tenderness in the right upper quadrant. There is no rigidity, no rebound and no guarding.  Musculoskeletal: Normal range of motion.  Neurological: He is alert and oriented to person, place, and time.  Skin: Skin is warm and dry.  Psychiatric: He has a normal mood and affect. His behavior is normal.     Assessment/Plan 77 yo male with findings concerning for cholecystitis -admit to surgery -plan for lap chole in the morning -We discussed the etiology of her pain, we discussed treatment options and recommended surgery. We discussed details of surgery including general anesthesia, laparoscopic approach, identification of cystic duct and common bile duct. Ligation of cystic duct and cystic artery. Possible need for intraoperative cholangiogram or open procedure. Possible risks of common bile duct injury, liver injury, cystic duct leak, bleeding, infection, post-cholecystectomy syndrome. The patient showed good understanding and all questions were answered -IV abx -clears until midnight  Mickeal Skinner, MD 01/28/2018, 7:42 PM

## 2018-01-28 NOTE — ED Notes (Signed)
Pt Returned from Ultrasound

## 2018-01-28 NOTE — ED Notes (Signed)
Pt given green sponge to wet mouth

## 2018-01-28 NOTE — ED Notes (Signed)
ED Provider at bedside. 

## 2018-01-29 ENCOUNTER — Encounter (HOSPITAL_COMMUNITY): Payer: Self-pay | Admitting: *Deleted

## 2018-01-29 ENCOUNTER — Inpatient Hospital Stay (HOSPITAL_COMMUNITY): Payer: Medicare HMO | Admitting: Critical Care Medicine

## 2018-01-29 ENCOUNTER — Inpatient Hospital Stay (HOSPITAL_COMMUNITY): Payer: Medicare HMO

## 2018-01-29 ENCOUNTER — Encounter (HOSPITAL_COMMUNITY): Admission: EM | Disposition: A | Discharge status: Home / Self Care | Payer: Self-pay | Source: Home / Self Care

## 2018-01-29 DIAGNOSIS — R932 Abnormal findings on diagnostic imaging of liver and biliary tract: Secondary | ICD-10-CM | POA: Diagnosis not present

## 2018-01-29 DIAGNOSIS — K8066 Calculus of gallbladder and bile duct with acute and chronic cholecystitis without obstruction: Secondary | ICD-10-CM | POA: Diagnosis not present

## 2018-01-29 DIAGNOSIS — Z79899 Other long term (current) drug therapy: Secondary | ICD-10-CM | POA: Diagnosis not present

## 2018-01-29 DIAGNOSIS — F17210 Nicotine dependence, cigarettes, uncomplicated: Secondary | ICD-10-CM | POA: Diagnosis present

## 2018-01-29 DIAGNOSIS — E785 Hyperlipidemia, unspecified: Secondary | ICD-10-CM | POA: Diagnosis not present

## 2018-01-29 DIAGNOSIS — K219 Gastro-esophageal reflux disease without esophagitis: Secondary | ICD-10-CM | POA: Diagnosis not present

## 2018-01-29 DIAGNOSIS — R109 Unspecified abdominal pain: Secondary | ICD-10-CM | POA: Diagnosis not present

## 2018-01-29 DIAGNOSIS — R69 Illness, unspecified: Secondary | ICD-10-CM | POA: Diagnosis not present

## 2018-01-29 DIAGNOSIS — I10 Essential (primary) hypertension: Secondary | ICD-10-CM | POA: Diagnosis not present

## 2018-01-29 DIAGNOSIS — Z87442 Personal history of urinary calculi: Secondary | ICD-10-CM | POA: Diagnosis not present

## 2018-01-29 DIAGNOSIS — K8020 Calculus of gallbladder without cholecystitis without obstruction: Secondary | ICD-10-CM | POA: Diagnosis not present

## 2018-01-29 DIAGNOSIS — K8067 Calculus of gallbladder and bile duct with acute and chronic cholecystitis with obstruction: Secondary | ICD-10-CM | POA: Diagnosis not present

## 2018-01-29 DIAGNOSIS — K8000 Calculus of gallbladder with acute cholecystitis without obstruction: Secondary | ICD-10-CM | POA: Diagnosis not present

## 2018-01-29 DIAGNOSIS — R1011 Right upper quadrant pain: Secondary | ICD-10-CM | POA: Diagnosis present

## 2018-01-29 DIAGNOSIS — K4090 Unilateral inguinal hernia, without obstruction or gangrene, not specified as recurrent: Secondary | ICD-10-CM | POA: Diagnosis not present

## 2018-01-29 DIAGNOSIS — K8012 Calculus of gallbladder with acute and chronic cholecystitis without obstruction: Secondary | ICD-10-CM | POA: Diagnosis not present

## 2018-01-29 DIAGNOSIS — K8050 Calculus of bile duct without cholangitis or cholecystitis without obstruction: Secondary | ICD-10-CM | POA: Diagnosis not present

## 2018-01-29 HISTORY — PX: CHOLECYSTECTOMY: SHX55

## 2018-01-29 LAB — COMPREHENSIVE METABOLIC PANEL
ALT: 28 U/L (ref 0–44)
AST: 28 U/L (ref 15–41)
Albumin: 3.7 g/dL (ref 3.5–5.0)
Alkaline Phosphatase: 55 U/L (ref 38–126)
Anion gap: 8 (ref 5–15)
BUN: 9 mg/dL (ref 8–23)
CO2: 23 mmol/L (ref 22–32)
Calcium: 8.9 mg/dL (ref 8.9–10.3)
Chloride: 106 mmol/L (ref 98–111)
Creatinine, Ser: 0.99 mg/dL (ref 0.61–1.24)
GFR calc Af Amer: >60 mL/min (ref >60)
GFR calc non Af Amer: >60 mL/min (ref >60)
Glucose, Bld: 113 mg/dL — ABNORMAL HIGH (ref 70–99)
Potassium: 3.7 mmol/L (ref 3.5–5.1)
Sodium: 137 mmol/L (ref 135–145)
Total Bilirubin: 0.8 mg/dL (ref 0.3–1.2)
Total Protein: 6.4 g/dL — ABNORMAL LOW (ref 6.5–8.1)

## 2018-01-29 LAB — CBC
HEMATOCRIT: 46.5 % (ref 39.0–52.0)
HEMOGLOBIN: 16.2 g/dL (ref 13.0–17.0)
MCH: 32.9 pg (ref 26.0–34.0)
MCHC: 34.8 g/dL (ref 30.0–36.0)
MCV: 94.5 fL (ref 80.0–100.0)
NRBC: 0 % (ref 0.0–0.2)
Platelets: 129 10*3/uL — ABNORMAL LOW (ref 150–400)
RBC: 4.92 MIL/uL (ref 4.22–5.81)
RDW: 13.4 % (ref 11.5–15.5)
WBC: 15.6 10*3/uL — ABNORMAL HIGH (ref 4.0–10.5)

## 2018-01-29 LAB — SURGICAL PCR SCREEN
MRSA, PCR: NEGATIVE
Staphylococcus aureus: NEGATIVE

## 2018-01-29 LAB — GLUCOSE, CAPILLARY: GLUCOSE-CAPILLARY: 116 mg/dL — AB (ref 70–99)

## 2018-01-29 SURGERY — LAPAROSCOPIC CHOLECYSTECTOMY WITH INTRAOPERATIVE CHOLANGIOGRAM
Anesthesia: General | Site: Abdomen

## 2018-01-29 MED ORDER — MEPERIDINE HCL 50 MG/ML IJ SOLN: 6.2500 mg | INTRAMUSCULAR | Status: DC | PRN

## 2018-01-29 MED ORDER — GLUCAGON HCL RDNA (DIAGNOSTIC) 1 MG IJ SOLR
INTRAMUSCULAR | Status: DC | PRN
  Administered 2018-01-29: 1 mg via INTRAVENOUS

## 2018-01-29 MED ORDER — ACETAMINOPHEN 325 MG PO TABS
650.0000 mg | ORAL_TABLET | Freq: Four times a day (QID) | ORAL | Status: DC
  Administered 2018-01-29 – 2018-01-31 (×6): 650 mg via ORAL
  Filled 2018-01-29 (×6): qty 2

## 2018-01-29 MED ORDER — HYDROMORPHONE HCL 1 MG/ML IJ SOLN
INTRAMUSCULAR | Status: AC
  Filled 2018-01-29: qty 1

## 2018-01-29 MED ORDER — PROPOFOL 10 MG/ML IV BOLUS
INTRAVENOUS | Status: DC | PRN
  Administered 2018-01-29: 120 mg via INTRAVENOUS

## 2018-01-29 MED ORDER — LIDOCAINE 2% (20 MG/ML) 5 ML SYRINGE
INTRAMUSCULAR | Status: DC | PRN
  Administered 2018-01-29: 30 mg via INTRAVENOUS

## 2018-01-29 MED ORDER — PHENYLEPHRINE 40 MCG/ML (10ML) SYRINGE FOR IV PUSH (FOR BLOOD PRESSURE SUPPORT)
PREFILLED_SYRINGE | INTRAVENOUS | Status: DC | PRN
  Administered 2018-01-29 (×3): 40 ug via INTRAVENOUS
  Administered 2018-01-29 (×2): 80 ug via INTRAVENOUS
  Administered 2018-01-29 (×3): 40 ug via INTRAVENOUS
  Administered 2018-01-29: 120 ug via INTRAVENOUS

## 2018-01-29 MED ORDER — ONDANSETRON HCL 4 MG/2ML IJ SOLN
INTRAMUSCULAR | Status: DC | PRN
  Administered 2018-01-29: 4 mg via INTRAVENOUS

## 2018-01-29 MED ORDER — DEXAMETHASONE SODIUM PHOSPHATE 10 MG/ML IJ SOLN
INTRAMUSCULAR | Status: DC | PRN
  Administered 2018-01-29: 10 mg via INTRAVENOUS

## 2018-01-29 MED ORDER — GLUCAGON HCL RDNA (DIAGNOSTIC) 1 MG IJ SOLR
INTRAMUSCULAR | Status: AC
  Filled 2018-01-29: qty 1

## 2018-01-29 MED ORDER — IOPAMIDOL (ISOVUE-300) INJECTION 61%
INTRAVENOUS | Status: AC
  Filled 2018-01-29: qty 50

## 2018-01-29 MED ORDER — SODIUM CHLORIDE 0.9 % IR SOLN
Status: DC | PRN
  Administered 2018-01-29: 1000 mL

## 2018-01-29 MED ORDER — PROPOFOL 10 MG/ML IV BOLUS
INTRAVENOUS | Status: AC
  Filled 2018-01-29: qty 20

## 2018-01-29 MED ORDER — PROMETHAZINE HCL 25 MG/ML IJ SOLN: 6.2500 mg | INTRAMUSCULAR | Status: DC | PRN

## 2018-01-29 MED ORDER — ENOXAPARIN SODIUM 40 MG/0.4ML ~~LOC~~ SOLN
40.0000 mg | SUBCUTANEOUS | Status: DC
  Administered 2018-01-30 – 2018-01-31 (×2): 40 mg via SUBCUTANEOUS
  Filled 2018-01-29 (×2): qty 0.4

## 2018-01-29 MED ORDER — LACTATED RINGERS IV SOLN
INTRAVENOUS | Status: DC
  Administered 2018-01-29 (×2): via INTRAVENOUS

## 2018-01-29 MED ORDER — BUPIVACAINE-EPINEPHRINE 0.25% -1:200000 IJ SOLN
INTRAMUSCULAR | Status: DC | PRN
  Administered 2018-01-29: 10 mL

## 2018-01-29 MED ORDER — SUGAMMADEX SODIUM 200 MG/2ML IV SOLN
INTRAVENOUS | Status: DC | PRN
  Administered 2018-01-29: 130 mg via INTRAVENOUS

## 2018-01-29 MED ORDER — BUPIVACAINE-EPINEPHRINE (PF) 0.25% -1:200000 IJ SOLN
INTRAMUSCULAR | Status: AC
  Filled 2018-01-29: qty 30

## 2018-01-29 MED ORDER — FENTANYL CITRATE (PF) 250 MCG/5ML IJ SOLN
INTRAMUSCULAR | Status: AC
  Filled 2018-01-29: qty 5

## 2018-01-29 MED ORDER — 0.9 % SODIUM CHLORIDE (POUR BTL) OPTIME
TOPICAL | Status: DC | PRN
  Administered 2018-01-29: 1000 mL

## 2018-01-29 MED ORDER — MIDAZOLAM HCL 2 MG/2ML IJ SOLN: 0.5000 mg | Freq: Once | INTRAMUSCULAR | Status: DC | PRN

## 2018-01-29 MED ORDER — SODIUM CHLORIDE 0.9 % IV SOLN
INTRAVENOUS | Status: DC | PRN
  Administered 2018-01-29: 25 ug/min via INTRAVENOUS

## 2018-01-29 MED ORDER — HYDROMORPHONE HCL 1 MG/ML IJ SOLN
0.2500 mg | INTRAMUSCULAR | Status: DC | PRN
  Administered 2018-01-29 (×2): 0.5 mg via INTRAVENOUS

## 2018-01-29 MED ORDER — FENTANYL CITRATE (PF) 250 MCG/5ML IJ SOLN
INTRAMUSCULAR | Status: DC | PRN
  Administered 2018-01-29: 25 ug via INTRAVENOUS
  Administered 2018-01-29 (×2): 50 ug via INTRAVENOUS
  Administered 2018-01-29: 25 ug via INTRAVENOUS
  Administered 2018-01-29: 50 ug via INTRAVENOUS

## 2018-01-29 MED ORDER — ROCURONIUM BROMIDE 10 MG/ML (PF) SYRINGE
PREFILLED_SYRINGE | INTRAVENOUS | Status: DC | PRN
  Administered 2018-01-29: 40 mg via INTRAVENOUS

## 2018-01-29 MED ORDER — SODIUM CHLORIDE 0.9 % IV SOLN
INTRAVENOUS | Status: DC | PRN
  Administered 2018-01-29: 31 mL

## 2018-01-29 SURGICAL SUPPLY — 46 items
APPLIER CLIP ROT 10 11.4 M/L (STAPLE) ×2
BENZOIN TINCTURE PRP APPL 2/3 (GAUZE/BANDAGES/DRESSINGS) ×2 IMPLANT
BLADE CLIPPER SURG (BLADE) ×2 IMPLANT
CANISTER SUCT 3000ML PPV (MISCELLANEOUS) ×2 IMPLANT
CATH EMB 4FR 80CM (CATHETERS) ×2 IMPLANT
CHLORAPREP W/TINT 26ML (MISCELLANEOUS) ×2 IMPLANT
CLIP APPLIE ROT 10 11.4 M/L (STAPLE) ×1 IMPLANT
COVER MAYO STAND STRL (DRAPES) ×2 IMPLANT
COVER SURGICAL LIGHT HANDLE (MISCELLANEOUS) ×2 IMPLANT
COVER WAND RF STERILE (DRAPES) IMPLANT
DRAPE C-ARM 42X72 X-RAY (DRAPES) ×2 IMPLANT
DRSG TEGADERM 2-3/8X2-3/4 SM (GAUZE/BANDAGES/DRESSINGS) ×6 IMPLANT
DRSG TEGADERM 4X4.75 (GAUZE/BANDAGES/DRESSINGS) ×2 IMPLANT
ELECT REM PT RETURN 9FT ADLT (ELECTROSURGICAL) ×2
ELECTRODE REM PT RTRN 9FT ADLT (ELECTROSURGICAL) ×1 IMPLANT
FILTER SMOKE EVAC LAPAROSHD (FILTER) ×2 IMPLANT
GAUZE SPONGE 2X2 8PLY STRL LF (GAUZE/BANDAGES/DRESSINGS) ×1 IMPLANT
GLOVE BIO SURGEON STRL SZ7 (GLOVE) ×4 IMPLANT
GLOVE BIOGEL PI IND STRL 7.5 (GLOVE) ×1 IMPLANT
GLOVE BIOGEL PI INDICATOR 7.5 (GLOVE) ×1
GOWN STRL REUS W/ TWL LRG LVL3 (GOWN DISPOSABLE) ×4 IMPLANT
GOWN STRL REUS W/TWL LRG LVL3 (GOWN DISPOSABLE) ×4
KIT BASIN OR (CUSTOM PROCEDURE TRAY) ×2 IMPLANT
KIT TURNOVER KIT B (KITS) ×2 IMPLANT
NS IRRIG 1000ML POUR BTL (IV SOLUTION) ×2 IMPLANT
PAD ARMBOARD 7.5X6 YLW CONV (MISCELLANEOUS) ×2 IMPLANT
POUCH RETRIEVAL ECOSAC 10 (ENDOMECHANICALS) ×1 IMPLANT
POUCH RETRIEVAL ECOSAC 10MM (ENDOMECHANICALS) ×1
POUCH SPECIMEN RETRIEVAL 10MM (ENDOMECHANICALS) IMPLANT
SCISSORS LAP 5X35 DISP (ENDOMECHANICALS) ×2 IMPLANT
SET CHOLANGIOGRAPH 5 50 .035 (SET/KITS/TRAYS/PACK) ×2 IMPLANT
SET IRRIG TUBING LAPAROSCOPIC (IRRIGATION / IRRIGATOR) ×2 IMPLANT
SLEEVE ENDOPATH XCEL 5M (ENDOMECHANICALS) ×2 IMPLANT
SPECIMEN JAR SMALL (MISCELLANEOUS) ×2 IMPLANT
SPONGE GAUZE 2X2 STER 10/PKG (GAUZE/BANDAGES/DRESSINGS) ×1
STRIP CLOSURE SKIN 1/2X4 (GAUZE/BANDAGES/DRESSINGS) ×2 IMPLANT
SUT MNCRL AB 4-0 PS2 18 (SUTURE) ×2 IMPLANT
SYR 3ML LL SCALE MARK (SYRINGE) ×2 IMPLANT
TOWEL OR 17X24 6PK STRL BLUE (TOWEL DISPOSABLE) ×2 IMPLANT
TOWEL OR 17X26 10 PK STRL BLUE (TOWEL DISPOSABLE) IMPLANT
TRAY LAPAROSCOPIC MC (CUSTOM PROCEDURE TRAY) ×2 IMPLANT
TROCAR XCEL BLUNT TIP 100MML (ENDOMECHANICALS) ×2 IMPLANT
TROCAR XCEL NON-BLD 11X100MML (ENDOMECHANICALS) ×2 IMPLANT
TROCAR XCEL NON-BLD 5MMX100MML (ENDOMECHANICALS) ×2 IMPLANT
TUBING INSUFFLATION (TUBING) ×2 IMPLANT
WATER STERILE IRR 1000ML POUR (IV SOLUTION) ×2 IMPLANT

## 2018-01-29 NOTE — Anesthesia Postprocedure Evaluation (Signed)
Anesthesia Post Note  Patient: Elio Hunt  Procedure(s) Performed: LAPAROSCOPIC CHOLECYSTECTOMY WITH INTRAOPERATIVE CHOLANGIOGRAM (N/A Abdomen)     Patient location during evaluation: PACU Anesthesia Type: General Level of consciousness: awake and alert, oriented and patient cooperative Pain management: pain level controlled Vital Signs Assessment: post-procedure vital signs reviewed and stable Respiratory status: spontaneous breathing, nonlabored ventilation and respiratory function stable Cardiovascular status: blood pressure returned to baseline and stable Postop Assessment: no apparent nausea or vomiting Anesthetic complications: no    Last Vitals:  Vitals:   01/29/18 1107 01/29/18 1139  BP: (!) 154/80 (!) 155/82  Pulse: 68 71  Resp: 13 18  Temp:  36.8 C  SpO2: 95% 91%    Last Pain:  Vitals:   01/29/18 1139  TempSrc: Oral  PainSc:                  Ermine Spofford,E. Cesareo Vickrey

## 2018-01-29 NOTE — Anesthesia Preprocedure Evaluation (Addendum)
Anesthesia Evaluation  Patient identified by MRN, date of birth, ID band Patient awake    Reviewed: Allergy & Precautions, NPO status , Patient's Chart, lab work & pertinent test results  History of Anesthesia Complications Negative for: history of anesthetic complications  Airway Mallampati: I  TM Distance: >3 FB Neck ROM: Full    Dental  (+) Edentulous Upper, Edentulous Lower   Pulmonary Current Smoker,    breath sounds clear to auscultation       Cardiovascular hypertension, Pt. on medications (-) angina Rhythm:Regular Rate:Normal     Neuro/Psych negative neurological ROS     GI/Hepatic negative GI ROS, Neg liver ROS,   Endo/Other  negative endocrine ROS  Renal/GU stones     Musculoskeletal   Abdominal   Peds  Hematology negative hematology ROS (+)   Anesthesia Other Findings   Reproductive/Obstetrics                            Anesthesia Physical Anesthesia Plan  ASA: II  Anesthesia Plan: General   Post-op Pain Management:    Induction: Intravenous  PONV Risk Score and Plan: 2 and Ondansetron and Dexamethasone  Airway Management Planned: Oral ETT  Additional Equipment:   Intra-op Plan:   Post-operative Plan: Extubation in OR  Informed Consent: I have reviewed the patients History and Physical, chart, labs and discussed the procedure including the risks, benefits and alternatives for the proposed anesthesia with the patient or authorized representative who has indicated his/her understanding and acceptance.   Dental advisory given  Plan Discussed with: CRNA and Surgeon  Anesthesia Plan Comments: (Plan routine monitors, GETA)        Anesthesia Quick Evaluation

## 2018-01-29 NOTE — Op Note (Signed)
Laparoscopic Cholecystectomy with IOC Procedure Note  Indications: This patient presents with symptomatic gallbladder disease and will undergo laparoscopic cholecystectomy.  Liver function tests were normal.  Pre-operative Diagnosis: Calculus of gallbladder with acute cholecystitis, without mention of obstruction  Post-operative Diagnosis: Calculus of gallbladder with acute cholecystitis and obstruction    Incidental left inguinal hernia  Surgeon: Maia Petties   Assistants: none  Anesthesia: General endotracheal anesthesia  ASA Class: 2  Procedure Details  The patient was seen again in the Holding Room. The risks, benefits, complications, treatment options, and expected outcomes were discussed with the patient. The possibilities of reaction to medication, pulmonary aspiration, perforation of viscus, bleeding, recurrent infection, finding a normal gallbladder, the need for additional procedures, failure to diagnose a condition, the possible need to convert to an open procedure, and creating a complication requiring transfusion or operation were discussed with the patient. The likelihood of improving the patient's symptoms with return to their baseline status is good.  The patient and/or family concurred with the proposed plan, giving informed consent. The site of surgery properly noted. The patient was taken to Operating Room, identified as William Black and the procedure verified as Laparoscopic Cholecystectomy with Intraoperative Cholangiogram. A Time Out was held and the above information confirmed.  Prior to the induction of general anesthesia, antibiotic prophylaxis was administered. General endotracheal anesthesia was then administered and tolerated well. After the induction, the abdomen was prepped with Chloraprep and draped in the sterile fashion. The patient was positioned in the supine position.  Local anesthetic agent was injected into the skin below the umbilicus and an incision  made. We dissected down to the abdominal fascia with blunt dissection.  The fascia was incised vertically and we entered the peritoneal cavity bluntly.  A pursestring suture of 0-Vicryl was placed around the fascial opening.  The Hasson cannula was inserted and secured with the stay suture.  Pneumoperitoneum was then created with CO2 and tolerated well without any adverse changes in the patient's vital signs. An 11-mm port was placed in the subxiphoid position.  Two 5-mm ports were placed in the right upper quadrant. All skin incisions were infiltrated with a local anesthetic agent before making the incision and placing the trocars.   We positioned the patient in reverse Trendelenburg, tilted slightly to the patient's left.  The gallbladder was very inflamed and edematous with adherent omentum.  The gallbladder was decompressed with the suction aspirator.  The omentum was peeled away from the gallbladder, the fundus grasped and retracted cephalad. Adhesions were lysed bluntly and with the electrocautery where indicated, taking care not to injure any adjacent organs or viscus. The infundibulum was grasped and retracted laterally, exposing the peritoneum overlying the triangle of Calot. This was then divided and exposed in a blunt fashion. A critical view of the cystic duct and cystic artery was obtained.  The cystic duct was clearly identified and bluntly dissected circumferentially.  The cystic duct was dilated. The cystic duct was ligated with a clip distally.   An incision was made in the cystic duct and the [facility] cholangiogram catheter introduced. The catheter was secured using a clip. A cholangiogram was then obtained which showed good visualization of the distal and proximal biliary tree with a dilated CBD.  There were some filling defects in the distal CBD.  Initially, contrast did not flow into the duodenum, but eventually, contrast flowed around the filling defects into the duodenum.  We administered  glucagon and repeated the cholangiogram.  The filling  defects persisted.  I attempted to pass a Fogarty catheter but it did not thread down through the cystic duct. The catheter was then removed.   The cystic duct was then ligated with clips and divided. The cystic artery was identified, dissected free, ligated with clips and divided as well.   The gallbladder was dissected from the liver bed in retrograde fashion with the electrocautery. The gallbladder was removed and placed in an Eco sac. The liver bed was irrigated and inspected. Hemostasis was achieved with the electrocautery. Copious irrigation was utilized and was repeatedly aspirated until clear.  The gallbladder and Eco sac were then removed through the umbilical port site.  The pursestring suture was used to close the umbilical fascia.    Incidentally, the patient was noted to have a left inguinal hernia without incarceration.   This was also noted on his CT scan.  This can be repaired electively.  We again inspected the right upper quadrant for hemostasis.  Pneumoperitoneum was released as we removed the trocars.  4-0 Monocryl was used to close the skin.   Benzoin, steri-strips, and clean dressings were applied. The patient was then extubated and brought to the recovery room in stable condition. Instrument, sponge, and needle counts were correct at closure and at the conclusion of the case.   Findings: Cholecystitis with Cholelithiasis; distal CBD stone with partial obstruction  Estimated Blood Loss: less than 50 mL         Drains: none         Specimens: Gallbladder           Complications: None; patient tolerated the procedure well.         Disposition: PACU - hemodynamically stable.   We will consult GI for ERCP.         Condition: stable  Imogene Burn. Georgette Dover, MD, [facility]  General/ Trauma Surgery Beeper [phone]  01/29/2018 10:10 AM

## 2018-01-29 NOTE — Transfer of Care (Signed)
Immediate Anesthesia Transfer of Care Note  Patient: William Black  Procedure(s) Performed: LAPAROSCOPIC CHOLECYSTECTOMY WITH INTRAOPERATIVE CHOLANGIOGRAM (N/A Abdomen)  Patient Location: PACU  Anesthesia Type:General  Level of Consciousness: awake, alert  and oriented  Airway & Oxygen Therapy: Patient Spontanous Breathing and Patient connected to nasal cannula oxygen  Post-op Assessment: Report given to RN and Post -op Vital signs reviewed and stable  Post vital signs: Reviewed and stable  Last Vitals:  Vitals Value Taken Time  BP 151/78 01/29/2018 10:13 AM  Temp    Pulse 48 01/29/2018 10:14 AM  Resp 9 01/29/2018 10:14 AM  SpO2 100 % 01/29/2018 10:14 AM  Vitals shown include unvalidated device data.  Last Pain:  Vitals:   01/29/18 0749  TempSrc:   PainSc: 4       Patients Stated Pain Goal: 4 (33/38/32 9191)  Complications: No apparent anesthesia complications

## 2018-01-29 NOTE — Progress Notes (Addendum)
RN verified the presence of a signed informed consent that matches stated procedure by patient. Verified armband matches patient's stated name and birth date. Verified NPO status (yesterday PM) and that all jewelry, contact, glasses, dentures, and partials had been removed (if applicable). Lower partial plate given to family at bedside.

## 2018-01-29 NOTE — Anesthesia Procedure Notes (Signed)
Procedure Name: Intubation Date/Time: 01/29/2018 8:51 AM Performed by: Wilburn Cornelia, CRNA Pre-anesthesia Checklist: Patient identified, Emergency Drugs available, Suction available, Timeout performed and Patient being monitored Patient Re-evaluated:Patient Re-evaluated prior to induction Oxygen Delivery Method: Circle system utilized Preoxygenation: Pre-oxygenation with 100% oxygen Induction Type: IV induction Ventilation: Mask ventilation without difficulty Laryngoscope Size: Mac and 3 Grade View: Grade I Tube type: Oral Tube size: 7.5 mm Number of attempts: 1 Airway Equipment and Method: Stylet Placement Confirmation: ETT inserted through vocal cords under direct vision,  positive ETCO2,  CO2 detector and breath sounds checked- equal and bilateral Secured at: 21 cm Tube secured with: Tape Dental Injury: Teeth and Oropharynx as per pre-operative assessment

## 2018-01-29 NOTE — Discharge Instructions (Signed)
[facility] ° °Please arrive at least 30 min before your appointment to complete your check in paperwork.  If you are unable to arrive 30 min prior to your appointment time we may have to cancel or reschedule you. °LAPAROSCOPIC SURGERY: POST OP INSTRUCTIONS °Always review your discharge instruction sheet given to you by the facility where your surgery was performed. °IF YOU HAVE DISABILITY OR FAMILY LEAVE FORMS, YOU MUST BRING THEM TO THE OFFICE FOR PROCESSING.   °DO NOT GIVE THEM TO YOUR DOCTOR. ° °PAIN CONTROL ° °1. First take acetaminophen (Tylenol) AND/or ibuprofen (Advil) to control your pain after surgery.  Follow directions on package.  Taking acetaminophen (Tylenol) and/or ibuprofen (Advil) regularly after surgery will help to control your pain and lower the amount of prescription pain medication you may need.  You should not take more than 4,000 mg (4 grams) of acetaminophen (Tylenol) in 24 hours.  You should not take ibuprofen (Advil), aleve, motrin, naprosyn or other NSAIDS if you have a history of stomach ulcers or chronic kidney disease.  °2. A prescription for pain medication may be given to you upon discharge.  Take your pain medication as prescribed, if you still have uncontrolled pain after taking acetaminophen (Tylenol) or ibuprofen (Advil). °3. Use ice packs to help control pain. °4. If you need a refill on your pain medication, please contact your pharmacy.  They will contact our office to request authorization. Prescriptions will not be filled after 5pm or on week-ends. ° °HOME MEDICATIONS °5. Take your usually prescribed medications unless otherwise directed. ° °DIET °6. You should follow a light diet the first few days after arrival home.  Be sure to include lots of fluids daily. Avoid fatty, fried foods.  ° °CONSTIPATION °7. It is common to experience some constipation after surgery and if you are taking pain medication.  Increasing fluid intake and taking a stool  softener (such as Colace) will usually help or prevent this problem from occurring.  A mild laxative (Milk of Magnesia or Miralax) should be taken according to package instructions if there are no bowel movements after 48 hours. ° °WOUND/INCISION CARE °8. Most patients will experience some swelling and bruising in the area of the incisions.  Ice packs will help.  Swelling and bruising can take several days to resolve.  °9. Unless discharge instructions indicate otherwise, follow guidelines below  °a. STERI-STRIPS - you may remove your outer bandages 48 hours after surgery, and you may shower at that time.  You have steri-strips (small skin tapes) in place directly over the incision.  These strips should be left on the skin for 7-10 days.   °b. DERMABOND/SKIN GLUE - you may shower in 24 hours.  The glue will flake off over the next 2-3 weeks. °10. Any sutures or staples will be removed at the office during your follow-up visit. ° °ACTIVITIES °11. You may resume regular (light) daily activities beginning the next day--such as daily self-care, walking, climbing stairs--gradually increasing activities as tolerated.  You may have sexual intercourse when it is comfortable.  Refrain from any heavy lifting or straining until approved by your doctor. °a. You may drive when you are no longer taking prescription pain medication, you can comfortably wear a seatbelt, and you can safely maneuver your car and apply brakes. ° °FOLLOW-UP °12. You should see your doctor in the office for a follow-up appointment approximately 2-3 weeks after your surgery.  You should have been given your post-op/follow-up appointment when   your surgery was scheduled.  If you did not receive a post-op/follow-up appointment, make sure that you call for this appointment within a day or two after you arrive home to insure a convenient appointment time. ° °OTHER INSTRUCTIONS ° °WHEN TO CALL YOUR DOCTOR: °1. Fever over 101.0 °2. Inability to  urinate °3. Continued bleeding from incision. °4. Increased pain, redness, or drainage from the incision. °5. Increasing abdominal pain ° °The clinic staff is available to answer your questions during regular business hours.  Please don’t hesitate to call and ask to speak to one of the nurses for clinical concerns.  If you have a medical emergency, go to the nearest emergency room or call 911.  A surgeon from [facility] is always on call at the hospital. °[address], [address], [place], [place]  [address] ? [address] [address], [place], [place]   [address] °[phone] ? [phone] ? FAX [phone] ° ° ° °

## 2018-01-29 NOTE — H&P (View-Only) (Signed)
[facility] Consultation Note  Referring Provider: Dr. Gretta Arab ([facility]) Primary Care Physician:  [name], MD  Reason for Consultation:  Abnormal intraoperative cholangiogram  HPI: William Black is a 77 y.o. male with couple days of progressive abdominal pain felt to be gallstone-related.  Had cholecystectomy today with intraoperative cholangiogram (which I have personally reviewed) which showed a couple distal non-obstructive filling defects.  Normal LFTs.  Some post-operative soreness otherwise no complaints at this time.   Past Medical History:  Diagnosis Date  . High cholesterol   . Hypertension   . Kidney stones   . Kidney stones     Past Surgical History:  Procedure Laterality Date  . APPENDECTOMY    . CYSTOSCOPY    . DENTAL SURGERY      Prior to Admission medications   Medication Sig Start Date End Date Taking? Authorizing Provider  atorvastatin (LIPITOR) 20 MG tablet Take 20 mg by mouth at bedtime.  02/15/17  Yes [provider]  diltiazem (CARDIZEM CD) 360 MG 24 hr capsule Take 360 mg by mouth daily. 02/15/17  Yes [provider]  lisinopril (PRINIVIL,ZESTRIL) 40 MG tablet Take 40 mg by mouth 2 (two) times daily. 02/15/17  Yes [provider]  amoxicillin-clavulanate (AUGMENTIN) 875-125 MG tablet Take 1 tablet by mouth daily. Patient not taking: Reported on 01/28/2018 03/01/17   Regalado, Jerald Kief A, MD  cephALEXin (KEFLEX) 500 MG capsule Take 1 capsule (500 mg total) by mouth 4 (four) times daily. Patient not taking: Reported on 01/28/2018 10/05/17   Palumbo, April, MD  saccharomyces boulardii (FLORASTOR) 250 MG capsule Take 1 capsule (250 mg total) by mouth 2 (two) times daily. Patient not taking: Reported on 01/28/2018 03/01/17   Elmarie Shiley, MD    Current Facility-Administered Medications  Medication Dose Route Frequency Provider Last Rate Last Dose  . acetaminophen (TYLENOL) tablet 650 mg  650 mg Oral Q6H  Jill Alexanders, PA-C   650 mg at 01/29/18 1304  . cefTRIAXone (ROCEPHIN) 2 g in sodium chloride 0.9 % 100 mL IVPB  2 g Intravenous Q24H Jill Alexanders, PA-C   Stopped at 01/28/18 2243  . dextrose 5 %-0.45 % sodium chloride infusion   Intravenous Continuous Jill Alexanders, PA-C 75 mL/hr at 01/29/18 1203    . diphenhydrAMINE (BENADRYL) 12.5 MG/5ML elixir 12.5 mg  12.5 mg Oral Q6H PRN Jill Alexanders, PA-C       Or  . diphenhydrAMINE (BENADRYL) injection 12.5 mg  12.5 mg Intravenous Q6H PRN Jill Alexanders, PA-C      . [START ON 01/30/2018] enoxaparin (LOVENOX) injection 40 mg  40 mg Subcutaneous Q24H Simaan, Elizabeth S, PA-C      . HYDROmorphone (DILAUDID) 1 MG/ML injection           . lactated ringers infusion   Intravenous Continuous Simaan, Darci Current, PA-C      . metoprolol tartrate (LOPRESSOR) injection 5 mg  5 mg Intravenous Q6H PRN Jill Alexanders, PA-C      . morphine 2 MG/ML injection 2 mg  2 mg Intravenous Q2H PRN Jill Alexanders, PA-C   2 mg at 01/29/18 0557  . ondansetron (ZOFRAN-ODT) disintegrating tablet 4 mg  4 mg Oral Q6H PRN Jill Alexanders, PA-C       Or  . ondansetron (ZOFRAN) injection 4 mg  4 mg Intravenous Q6H PRN Jill Alexanders, PA-C      . oxyCODONE (Oxy IR/ROXICODONE) immediate release tablet 5 mg  5  mg Oral Q4H PRN Jill Alexanders, PA-C   5 mg at 01/29/18 1159    Allergies as of 01/28/2018  . (No Known Allergies)    History reviewed. No pertinent family history.  Social History   Socioeconomic History  . Marital status: Married    Spouse name: Not on file  . Number of children: Not on file  . Years of education: Not on file  . Highest education level: Not on file  Occupational History  . Not on file  Social Needs  . Financial resource strain: Not on file  . Food insecurity:    Worry: Not on file    Inability: Not on file  . Transportation needs:    Medical: Not on file    Non-medical: Not on file  Tobacco  Use  . Smoking status: Current Every Day Smoker  . Smokeless tobacco: Never Used  Substance and Sexual Activity  . Alcohol use: No    Frequency: Never  . Drug use: No  . Sexual activity: Not on file  Lifestyle  . Physical activity:    Days per week: Not on file    Minutes per session: Not on file  . Stress: Not on file  Relationships  . Social connections:    Talks on phone: Not on file    Gets together: Not on file    Attends religious service: Not on file    Active member of club or organization: Not on file    Attends meetings of clubs or organizations: Not on file    Relationship status: Not on file  . Intimate partner violence:    Fear of current or ex partner: Not on file    Emotionally abused: Not on file    Physically abused: Not on file    Forced sexual activity: Not on file  Other Topics Concern  . Not on file  Social History Narrative  . Not on file    Review of Systems: As per HPI, all others negative  Physical Exam: Vital signs in last 24 hours: Temp:  [97.7 F (36.5 C)-98.5 F (36.9 C)] 98.3 F (36.8 C) (11/18 1139) Pulse Rate:  [48-71] 71 (11/18 1139) Resp:  [9-20] 18 (11/18 1139) BP: (122-155)/(63-82) 155/82 (11/18 1139) SpO2:  [89 %-100 %] 91 % (11/18 1139) Last BM Date: 01/28/18 General:   Alert,  Well-developed, well-nourished, younger-appearing than stated age, pleasant and cooperative in NAD Head:  Normocephalic and atraumatic. Eyes:  Sclera clear, no icterus.   Conjunctiva pink. Ears:  Normal auditory acuity. Nose:  No deformity, discharge,  or lesions. Mouth:  No deformity or lesions.  Oropharynx pink & moist. Neck:  Supple; no masses or thyromegaly. Abdomen:  Soft, mild post-operative abdominal tenderness, No masses, hepatosplenomegaly or hernias noted. Normal bowel sounds, without guarding, and without rebound.     Msk:  Symmetrical without gross deformities. Normal posture. Pulses:  Normal pulses noted. Extremities:  Without clubbing or  edema. Neurologic:  Alert and  oriented x4;  grossly normal neurologically. Skin:  Intact without significant lesions or rashes. Cervical Nodes:  No significant cervical adenopathy. Psych:  Alert and cooperative. Normal mood and affect.   Lab Results: Recent Labs    01/28/18 0944 01/29/18 0130  WBC 7.0 15.6*  HGB 17.1* 16.2  HCT 49.9 46.5  PLT 142* 129*   BMET Recent Labs    01/28/18 0944 01/29/18 0130  NA 137 137  K 3.3* 3.7  CL 104 106  CO2 22 23  GLUCOSE 132* 113*  BUN 9 9  CREATININE 0.89 0.99  CALCIUM 9.8 8.9   LFT Recent Labs    01/28/18 0944 01/29/18 0130  PROT 7.4 6.4*  ALBUMIN 4.4 3.7  AST 34 28  ALT 33 28  ALKPHOS 67 55  BILITOT 1.0 0.8  BILIDIR 0.2  --   IBILI 0.8  --    PT/INR No results for input(s): LABPROT, INR in the last 72 hours.  Studies/Results: Dg Cholangiogram Operative  Result Date: 01/29/2018 CLINICAL DATA:  Intraoperative cholangiogram during laparoscopic cholecystectomy. EXAM: INTRAOPERATIVE CHOLANGIOGRAM FLUOROSCOPY TIME:  23 seconds COMPARISON:  Right upper quadrant abdominal ultrasound - 01/28/2018; CT abdomen and pelvis - 01/28/2018 FINDINGS: Intraoperative cholangiographic images of the right upper abdominal quadrant during laparoscopic cholecystectomy are provided for review. Surgical clips overlie the expected location of the gallbladder fossa. Contrast injection demonstrates selective cannulation of the central aspect of the cystic duct. There is passage of contrast through the central aspect of the cystic duct with filling of a non dilated common bile duct. There is passage of contrast though the CBD and into the descending portion of the duodenum. There is minimal reflux of injected contrast into the common hepatic duct and central aspect of the non dilated intrahepatic biliary system. There are several persistent nonocclusive filling defect in the distal aspect of the CBD worrisome for choledocholithiasis. Laminated opacities  overlie the right upper abdominal quadrant compatible with cholelithiasis. IMPRESSION: 1. Nonocclusive filling defects within the distal aspect of the CBD worrisome for choledocholithiasis. 2. Cholelithiasis. Electronically Signed   By: Sandi Mariscal M.D.   On: 01/29/2018 10:19   Ct Abdomen Pelvis W Contrast  Result Date: 01/28/2018 CLINICAL DATA:  Diffuse abdominal pain extending into the chest since 1 a.m. today. Smoker. EXAM: CT ABDOMEN AND PELVIS WITH CONTRAST TECHNIQUE: Multidetector CT imaging of the abdomen and pelvis was performed using the standard protocol following bolus administration of intravenous contrast. CONTRAST:  169mL OMNIPAQUE IOHEXOL 300 MG/ML  SOLN COMPARISON:  10/04/2017. FINDINGS: Lower chest: The lungs remain hyperexpanded with stable linear scar in the right lower lobe. Hepatobiliary: Multiple gallstones in the gallbladder measuring up to 10 mm in maximum diameter each. No gallbladder wall thickening or pericholecystic fluid. Normal appearing liver. Pancreas: Unremarkable. No pancreatic ductal dilatation or surrounding inflammatory changes. Spleen: Normal in size without focal abnormality. Adrenals/Urinary Tract: Adrenal glands are unremarkable. Kidneys are normal, without renal calculi, focal lesion, or hydronephrosis. Bladder is unremarkable. Stomach/Bowel: Multiple sigmoid and descending colon diverticula without evidence of diverticulitis. Surgically absent appendix. Unremarkable stomach and small bowel. Vascular/Lymphatic: Atheromatous arterial calcifications without aneurysm. No enlarged lymph nodes. Reproductive: Moderately enlarged prostate gland. Other: Moderate-sized left inguinal hernia containing fat. Musculoskeletal: L5-S1 degenerative changes. IMPRESSION: 1. No acute abnormality. 2. Cholelithiasis. 3. Sigmoid and descending colon diverticulosis. 4. Moderately enlarged prostate gland. 5. Moderate-sized left inguinal hernia containing fat. 6. COPD. Electronically Signed    By: Claudie Revering M.D.   On: 01/28/2018 12:12   Dg Chest Port 1 View  Result Date: 01/28/2018 CLINICAL DATA:  Abdominal pain radiating to chest and back, dizziness EXAM: PORTABLE CHEST 1 VIEW COMPARISON:  Low-dose lung cancer CT chest dated 08/01/2017 FINDINGS: Lungs are clear. No pleural effusion or pneumothorax. The heart is normal in size. IMPRESSION: No evidence of acute cardiopulmonary disease. Electronically Signed   By: Julian Hy M.D.   On: 01/28/2018 10:31   US Abdomen Limited Ruq  Result Date: 01/28/2018 CLINICAL DATA:  Epigastric abdominal pain. EXAM: ULTRASOUND ABDOMEN LIMITED RIGHT  UPPER QUADRANT COMPARISON:  CT abdomen pelvis from same day. FINDINGS: Gallbladder: Distended gallbladder containing multiple gallstones. There is a stone in the gallbladder neck near the cystic duct. No wall thickening. No sonographic Murphy sign noted by sonographer. Common bile duct: Diameter: 7 mm, at the upper limits of normal. Liver: No focal lesion identified. Within normal limits in parenchymal echogenicity. Portal vein is patent on color Doppler imaging with normal direction of blood flow towards the liver. IMPRESSION: 1. Cholelithiasis, including a gallstone in the gallbladder neck near the cystic duct. The gallbladder is distended, but there is otherwise no sonographic evidence of acute cholecystitis at this time. Electronically Signed   By: Titus Dubin M.D.   On: 01/28/2018 16:07    Impression:  1.  Symptomatic cholelithiasis s/p cholecystectomy. 2.  Intraoperative cholangiogram suggestive of non-obstructive choledocholithiasis.  Plan:  1.  Endoscopic retrograde cholangiopancreatography with possible biliary sphincterotomy and bile duct stone extraction. 2.  Risks (up to and including bleeding, infection, perforation, pancreatitis that can be complicated by infected necrosis and death), benefits (removal of stones, alleviating blockage, decreasing risk of cholangitis or  choledocholithiasis-related pancreatitis), and alternatives (watchful waiting, percutaneous transhepatic cholangiography) of ERCP were explained to patient/family in detail and patient elects to proceed.   LOS: 1 day   Vernette Moise M  01/29/2018, 2:42 PM  Cell [phone] If no answer or after 5 PM call [phone]

## 2018-01-29 NOTE — Progress Notes (Signed)
Day of Surgery   Subjective/Chief Complaint: Pain is localized in RUQ No nausea or vomiting   Objective: Vital signs in last 24 hours: Temp:  [98.1 F (36.7 C)-98.5 F (36.9 C)] 98.5 F (36.9 C) (11/18 0550) Pulse Rate:  [54-77] 69 (11/18 0550) Resp:  [14-18] 16 (11/18 0550) BP: (122-170)/(55-86) 122/74 (11/18 0550) SpO2:  [94 %-99 %] 94 % (11/18 0550) Weight:  [64.9 kg] 64.9 kg (11/17 1023) Last BM Date: 01/28/18  Intake/Output from previous day: 11/17 0701 - 11/18 0700 In: 478.9 [I.V.:378.9; IV Piggyback:100] Out: -  Intake/Output this shift: No intake/output data recorded.  General appearance: alert, cooperative and no distress GI: RUQ tenderness Skin: no sign of jaundice  Lab Results:  Recent Labs    01/28/18 0944 01/29/18 0130  WBC 7.0 15.6*  HGB 17.1* 16.2  HCT 49.9 46.5  PLT 142* 129*   BMET Recent Labs    01/28/18 0944 01/29/18 0130  NA 137 137  K 3.3* 3.7  CL 104 106  CO2 22 23  GLUCOSE 132* 113*  BUN 9 9  CREATININE 0.89 0.99  CALCIUM 9.8 8.9   Hepatic Function Latest Ref Rng & Units 01/29/2018 01/28/2018 02/26/2017  Total Protein 6.5 - 8.1 g/dL 6.4(L) 7.4 5.7(L)  Albumin 3.5 - 5.0 g/dL 3.7 4.4 3.6  AST 15 - 41 U/L 28 34 33  ALT 0 - 44 U/L 28 33 29  Alk Phosphatase 38 - 126 U/L 55 67 52  Total Bilirubin 0.3 - 1.2 mg/dL 0.8 1.0 0.5  Bilirubin, Direct 0.0 - 0.2 mg/dL - 0.2 -    PT/INR No results for input(s): LABPROT, INR in the last 72 hours. ABG No results for input(s): PHART, HCO3 in the last 72 hours.  Invalid input(s): PCO2, PO2  Studies/Results: Ct Abdomen Pelvis W Contrast  Result Date: 01/28/2018 CLINICAL DATA:  Diffuse abdominal pain extending into the chest since 1 a.m. today. Smoker. EXAM: CT ABDOMEN AND PELVIS WITH CONTRAST TECHNIQUE: Multidetector CT imaging of the abdomen and pelvis was performed using the standard protocol following bolus administration of intravenous contrast. CONTRAST:  137m OMNIPAQUE IOHEXOL 300  MG/ML  SOLN COMPARISON:  10/04/2017. FINDINGS: Lower chest: The lungs remain hyperexpanded with stable linear scar in the right lower lobe. Hepatobiliary: Multiple gallstones in the gallbladder measuring up to 10 mm in maximum diameter each. No gallbladder wall thickening or pericholecystic fluid. Normal appearing liver. Pancreas: Unremarkable. No pancreatic ductal dilatation or surrounding inflammatory changes. Spleen: Normal in size without focal abnormality. Adrenals/Urinary Tract: Adrenal glands are unremarkable. Kidneys are normal, without renal calculi, focal lesion, or hydronephrosis. Bladder is unremarkable. Stomach/Bowel: Multiple sigmoid and descending colon diverticula without evidence of diverticulitis. Surgically absent appendix. Unremarkable stomach and small bowel. Vascular/Lymphatic: Atheromatous arterial calcifications without aneurysm. No enlarged lymph nodes. Reproductive: Moderately enlarged prostate gland. Other: Moderate-sized left inguinal hernia containing fat. Musculoskeletal: L5-S1 degenerative changes. IMPRESSION: 1. No acute abnormality. 2. Cholelithiasis. 3. Sigmoid and descending colon diverticulosis. 4. Moderately enlarged prostate gland. 5. Moderate-sized left inguinal hernia containing fat. 6. COPD. Electronically Signed   By: SClaudie ReveringM.D.   On: 01/28/2018 12:12   Dg Chest Port 1 View  Result Date: 01/28/2018 CLINICAL DATA:  Abdominal pain radiating to chest and back, dizziness EXAM: PORTABLE CHEST 1 VIEW COMPARISON:  Low-dose lung cancer CT chest dated 08/01/2017 FINDINGS: Lungs are clear. No pleural effusion or pneumothorax. The heart is normal in size. IMPRESSION: No evidence of acute cardiopulmonary disease. Electronically Signed   By: SJulian Hy  M.D.   On: 01/28/2018 10:31   US Abdomen Limited Ruq  Result Date: 01/28/2018 CLINICAL DATA:  Epigastric abdominal pain. EXAM: ULTRASOUND ABDOMEN LIMITED RIGHT UPPER QUADRANT COMPARISON:  CT abdomen pelvis from same  day. FINDINGS: Gallbladder: Distended gallbladder containing multiple gallstones. There is a stone in the gallbladder neck near the cystic duct. No wall thickening. No sonographic Murphy sign noted by sonographer. Common bile duct: Diameter: 7 mm, at the upper limits of normal. Liver: No focal lesion identified. Within normal limits in parenchymal echogenicity. Portal vein is patent on color Doppler imaging with normal direction of blood flow towards the liver. IMPRESSION: 1. Cholelithiasis, including a gallstone in the gallbladder neck near the cystic duct. The gallbladder is distended, but there is otherwise no sonographic evidence of acute cholecystitis at this time. Electronically Signed   By: Titus Dubin M.D.   On: 01/28/2018 16:07    Anti-infectives: Anti-infectives (From admission, onward)   Start     Dose/Rate Route Frequency Ordered Stop   01/28/18 2045  cefTRIAXone (ROCEPHIN) 2 g in sodium chloride 0.9 % 100 mL IVPB     2 g 200 mL/hr over 30 Minutes Intravenous Every 24 hours 01/28/18 2041        Assessment/Plan: Acute calculus cholecystitis  Plan laparoscopic cholecystectomy with intraoperative cholangiogram today.  The surgical procedure has been discussed with the patient.  Potential risks, benefits, alternative treatments, and expected outcomes have been explained.  All of the patient's questions at this time have been answered.  The likelihood of reaching the patient's treatment goal is good.  The patient understand the proposed surgical procedure and wishes to proceed.   LOS: 1 day    Maia Petties 01/29/2018

## 2018-01-29 NOTE — Accreditation Note (Signed)
Pre op report given to Lindsi .

## 2018-01-29 NOTE — Consult Note (Signed)
[facility] Consultation Note  Referring Provider: Dr. Gretta Arab ([facility]) Primary Care Physician:  [name], MD  Reason for Consultation:  Abnormal intraoperative cholangiogram  HPI: William Black is a 77 y.o. male with couple days of progressive abdominal pain felt to be gallstone-related.  Had cholecystectomy today with intraoperative cholangiogram (which I have personally reviewed) which showed a couple distal non-obstructive filling defects.  Normal LFTs.  Some post-operative soreness otherwise no complaints at this time.   Past Medical History:  Diagnosis Date  . High cholesterol   . Hypertension   . Kidney stones   . Kidney stones     Past Surgical History:  Procedure Laterality Date  . APPENDECTOMY    . CYSTOSCOPY    . DENTAL SURGERY      Prior to Admission medications   Medication Sig Start Date End Date Taking? Authorizing Provider  atorvastatin (LIPITOR) 20 MG tablet Take 20 mg by mouth at bedtime.  02/15/17  Yes [provider]  diltiazem (CARDIZEM CD) 360 MG 24 hr capsule Take 360 mg by mouth daily. 02/15/17  Yes [provider]  lisinopril (PRINIVIL,ZESTRIL) 40 MG tablet Take 40 mg by mouth 2 (two) times daily. 02/15/17  Yes [provider]  amoxicillin-clavulanate (AUGMENTIN) 875-125 MG tablet Take 1 tablet by mouth daily. Patient not taking: Reported on 01/28/2018 03/01/17   Regalado, Jerald Kief A, MD  cephALEXin (KEFLEX) 500 MG capsule Take 1 capsule (500 mg total) by mouth 4 (four) times daily. Patient not taking: Reported on 01/28/2018 10/05/17   Palumbo, April, MD  saccharomyces boulardii (FLORASTOR) 250 MG capsule Take 1 capsule (250 mg total) by mouth 2 (two) times daily. Patient not taking: Reported on 01/28/2018 03/01/17   Elmarie Shiley, MD    Current Facility-Administered Medications  Medication Dose Route Frequency Provider Last Rate Last Dose  . acetaminophen (TYLENOL) tablet 650 mg  650 mg Oral Q6H  Jill Alexanders, PA-C   650 mg at 01/29/18 1304  . cefTRIAXone (ROCEPHIN) 2 g in sodium chloride 0.9 % 100 mL IVPB  2 g Intravenous Q24H Jill Alexanders, PA-C   Stopped at 01/28/18 2243  . dextrose 5 %-0.45 % sodium chloride infusion   Intravenous Continuous Jill Alexanders, PA-C 75 mL/hr at 01/29/18 1203    . diphenhydrAMINE (BENADRYL) 12.5 MG/5ML elixir 12.5 mg  12.5 mg Oral Q6H PRN Jill Alexanders, PA-C       Or  . diphenhydrAMINE (BENADRYL) injection 12.5 mg  12.5 mg Intravenous Q6H PRN Jill Alexanders, PA-C      . [START ON 01/30/2018] enoxaparin (LOVENOX) injection 40 mg  40 mg Subcutaneous Q24H Simaan, Elizabeth S, PA-C      . HYDROmorphone (DILAUDID) 1 MG/ML injection           . lactated ringers infusion   Intravenous Continuous Simaan, Darci Current, PA-C      . metoprolol tartrate (LOPRESSOR) injection 5 mg  5 mg Intravenous Q6H PRN Jill Alexanders, PA-C      . morphine 2 MG/ML injection 2 mg  2 mg Intravenous Q2H PRN Jill Alexanders, PA-C   2 mg at 01/29/18 0557  . ondansetron (ZOFRAN-ODT) disintegrating tablet 4 mg  4 mg Oral Q6H PRN Jill Alexanders, PA-C       Or  . ondansetron (ZOFRAN) injection 4 mg  4 mg Intravenous Q6H PRN Jill Alexanders, PA-C      . oxyCODONE (Oxy IR/ROXICODONE) immediate release tablet 5 mg  5  mg Oral Q4H PRN Jill Alexanders, PA-C   5 mg at 01/29/18 1159    Allergies as of 01/28/2018  . (No Known Allergies)    History reviewed. No pertinent family history.  Social History   Socioeconomic History  . Marital status: Married    Spouse name: Not on file  . Number of children: Not on file  . Years of education: Not on file  . Highest education level: Not on file  Occupational History  . Not on file  Social Needs  . Financial resource strain: Not on file  . Food insecurity:    Worry: Not on file    Inability: Not on file  . Transportation needs:    Medical: Not on file    Non-medical: Not on file  Tobacco  Use  . Smoking status: Current Every Day Smoker  . Smokeless tobacco: Never Used  Substance and Sexual Activity  . Alcohol use: No    Frequency: Never  . Drug use: No  . Sexual activity: Not on file  Lifestyle  . Physical activity:    Days per week: Not on file    Minutes per session: Not on file  . Stress: Not on file  Relationships  . Social connections:    Talks on phone: Not on file    Gets together: Not on file    Attends religious service: Not on file    Active member of club or organization: Not on file    Attends meetings of clubs or organizations: Not on file    Relationship status: Not on file  . Intimate partner violence:    Fear of current or ex partner: Not on file    Emotionally abused: Not on file    Physically abused: Not on file    Forced sexual activity: Not on file  Other Topics Concern  . Not on file  Social History Narrative  . Not on file    Review of Systems: As per HPI, all others negative  Physical Exam: Vital signs in last 24 hours: Temp:  [97.7 F (36.5 C)-98.5 F (36.9 C)] 98.3 F (36.8 C) (11/18 1139) Pulse Rate:  [48-71] 71 (11/18 1139) Resp:  [9-20] 18 (11/18 1139) BP: (122-155)/(63-82) 155/82 (11/18 1139) SpO2:  [89 %-100 %] 91 % (11/18 1139) Last BM Date: 01/28/18 General:   Alert,  Well-developed, well-nourished, younger-appearing than stated age, pleasant and cooperative in NAD Head:  Normocephalic and atraumatic. Eyes:  Sclera clear, no icterus.   Conjunctiva pink. Ears:  Normal auditory acuity. Nose:  No deformity, discharge,  or lesions. Mouth:  No deformity or lesions.  Oropharynx pink & moist. Neck:  Supple; no masses or thyromegaly. Abdomen:  Soft, mild post-operative abdominal tenderness, No masses, hepatosplenomegaly or hernias noted. Normal bowel sounds, without guarding, and without rebound.     Msk:  Symmetrical without gross deformities. Normal posture. Pulses:  Normal pulses noted. Extremities:  Without clubbing or  edema. Neurologic:  Alert and  oriented x4;  grossly normal neurologically. Skin:  Intact without significant lesions or rashes. Cervical Nodes:  No significant cervical adenopathy. Psych:  Alert and cooperative. Normal mood and affect.   Lab Results: Recent Labs    01/28/18 0944 01/29/18 0130  WBC 7.0 15.6*  HGB 17.1* 16.2  HCT 49.9 46.5  PLT 142* 129*   BMET Recent Labs    01/28/18 0944 01/29/18 0130  NA 137 137  K 3.3* 3.7  CL 104 106  CO2 22 23  GLUCOSE 132* 113*  BUN 9 9  CREATININE 0.89 0.99  CALCIUM 9.8 8.9   LFT Recent Labs    01/28/18 0944 01/29/18 0130  PROT 7.4 6.4*  ALBUMIN 4.4 3.7  AST 34 28  ALT 33 28  ALKPHOS 67 55  BILITOT 1.0 0.8  BILIDIR 0.2  --   IBILI 0.8  --    PT/INR No results for input(s): LABPROT, INR in the last 72 hours.  Studies/Results: Dg Cholangiogram Operative  Result Date: 01/29/2018 CLINICAL DATA:  Intraoperative cholangiogram during laparoscopic cholecystectomy. EXAM: INTRAOPERATIVE CHOLANGIOGRAM FLUOROSCOPY TIME:  23 seconds COMPARISON:  Right upper quadrant abdominal ultrasound - 01/28/2018; CT abdomen and pelvis - 01/28/2018 FINDINGS: Intraoperative cholangiographic images of the right upper abdominal quadrant during laparoscopic cholecystectomy are provided for review. Surgical clips overlie the expected location of the gallbladder fossa. Contrast injection demonstrates selective cannulation of the central aspect of the cystic duct. There is passage of contrast through the central aspect of the cystic duct with filling of a non dilated common bile duct. There is passage of contrast though the CBD and into the descending portion of the duodenum. There is minimal reflux of injected contrast into the common hepatic duct and central aspect of the non dilated intrahepatic biliary system. There are several persistent nonocclusive filling defect in the distal aspect of the CBD worrisome for choledocholithiasis. Laminated opacities  overlie the right upper abdominal quadrant compatible with cholelithiasis. IMPRESSION: 1. Nonocclusive filling defects within the distal aspect of the CBD worrisome for choledocholithiasis. 2. Cholelithiasis. Electronically Signed   By: Sandi Mariscal M.D.   On: 01/29/2018 10:19   Ct Abdomen Pelvis W Contrast  Result Date: 01/28/2018 CLINICAL DATA:  Diffuse abdominal pain extending into the chest since 1 a.m. today. Smoker. EXAM: CT ABDOMEN AND PELVIS WITH CONTRAST TECHNIQUE: Multidetector CT imaging of the abdomen and pelvis was performed using the standard protocol following bolus administration of intravenous contrast. CONTRAST:  125mL OMNIPAQUE IOHEXOL 300 MG/ML  SOLN COMPARISON:  10/04/2017. FINDINGS: Lower chest: The lungs remain hyperexpanded with stable linear scar in the right lower lobe. Hepatobiliary: Multiple gallstones in the gallbladder measuring up to 10 mm in maximum diameter each. No gallbladder wall thickening or pericholecystic fluid. Normal appearing liver. Pancreas: Unremarkable. No pancreatic ductal dilatation or surrounding inflammatory changes. Spleen: Normal in size without focal abnormality. Adrenals/Urinary Tract: Adrenal glands are unremarkable. Kidneys are normal, without renal calculi, focal lesion, or hydronephrosis. Bladder is unremarkable. Stomach/Bowel: Multiple sigmoid and descending colon diverticula without evidence of diverticulitis. Surgically absent appendix. Unremarkable stomach and small bowel. Vascular/Lymphatic: Atheromatous arterial calcifications without aneurysm. No enlarged lymph nodes. Reproductive: Moderately enlarged prostate gland. Other: Moderate-sized left inguinal hernia containing fat. Musculoskeletal: L5-S1 degenerative changes. IMPRESSION: 1. No acute abnormality. 2. Cholelithiasis. 3. Sigmoid and descending colon diverticulosis. 4. Moderately enlarged prostate gland. 5. Moderate-sized left inguinal hernia containing fat. 6. COPD. Electronically Signed    By: Claudie Revering M.D.   On: 01/28/2018 12:12   Dg Chest Port 1 View  Result Date: 01/28/2018 CLINICAL DATA:  Abdominal pain radiating to chest and back, dizziness EXAM: PORTABLE CHEST 1 VIEW COMPARISON:  Low-dose lung cancer CT chest dated 08/01/2017 FINDINGS: Lungs are clear. No pleural effusion or pneumothorax. The heart is normal in size. IMPRESSION: No evidence of acute cardiopulmonary disease. Electronically Signed   By: Julian Hy M.D.   On: 01/28/2018 10:31   US Abdomen Limited Ruq  Result Date: 01/28/2018 CLINICAL DATA:  Epigastric abdominal pain. EXAM: ULTRASOUND ABDOMEN LIMITED RIGHT  UPPER QUADRANT COMPARISON:  CT abdomen pelvis from same day. FINDINGS: Gallbladder: Distended gallbladder containing multiple gallstones. There is a stone in the gallbladder neck near the cystic duct. No wall thickening. No sonographic Murphy sign noted by sonographer. Common bile duct: Diameter: 7 mm, at the upper limits of normal. Liver: No focal lesion identified. Within normal limits in parenchymal echogenicity. Portal vein is patent on color Doppler imaging with normal direction of blood flow towards the liver. IMPRESSION: 1. Cholelithiasis, including a gallstone in the gallbladder neck near the cystic duct. The gallbladder is distended, but there is otherwise no sonographic evidence of acute cholecystitis at this time. Electronically Signed   By: Titus Dubin M.D.   On: 01/28/2018 16:07    Impression:  1.  Symptomatic cholelithiasis s/p cholecystectomy. 2.  Intraoperative cholangiogram suggestive of non-obstructive choledocholithiasis.  Plan:  1.  Endoscopic retrograde cholangiopancreatography with possible biliary sphincterotomy and bile duct stone extraction. 2.  Risks (up to and including bleeding, infection, perforation, pancreatitis that can be complicated by infected necrosis and death), benefits (removal of stones, alleviating blockage, decreasing risk of cholangitis or  choledocholithiasis-related pancreatitis), and alternatives (watchful waiting, percutaneous transhepatic cholangiography) of ERCP were explained to patient/family in detail and patient elects to proceed.   LOS: 1 day   Reilynn Lauro M  01/29/2018, 2:42 PM  Cell [phone] If no answer or after 5 PM call [phone]

## 2018-01-30 ENCOUNTER — Inpatient Hospital Stay (HOSPITAL_COMMUNITY): Payer: Medicare HMO | Admitting: Certified Registered Nurse Anesthetist

## 2018-01-30 ENCOUNTER — Encounter (HOSPITAL_COMMUNITY): Admission: EM | Disposition: A | Discharge status: Home / Self Care | Payer: Self-pay | Source: Home / Self Care

## 2018-01-30 ENCOUNTER — Encounter (HOSPITAL_COMMUNITY): Payer: Self-pay | Admitting: Surgery

## 2018-01-30 ENCOUNTER — Inpatient Hospital Stay (HOSPITAL_COMMUNITY): Payer: Medicare HMO

## 2018-01-30 HISTORY — PX: REMOVAL OF STONES: SHX5545

## 2018-01-30 HISTORY — PX: ERCP: SHX5425

## 2018-01-30 HISTORY — PX: SPHINCTEROTOMY: SHX5544

## 2018-01-30 LAB — COMPREHENSIVE METABOLIC PANEL
ALK PHOS: 45 U/L (ref 38–126)
ALT: 53 U/L — AB (ref 0–44)
AST: 50 U/L — ABNORMAL HIGH (ref 15–41)
Albumin: 3.1 g/dL — ABNORMAL LOW (ref 3.5–5.0)
Anion gap: 8 (ref 5–15)
BILIRUBIN TOTAL: 0.7 mg/dL (ref 0.3–1.2)
BUN: 9 mg/dL (ref 8–23)
CO2: 24 mmol/L (ref 22–32)
CREATININE: 0.85 mg/dL (ref 0.61–1.24)
Calcium: 8.5 mg/dL — ABNORMAL LOW (ref 8.9–10.3)
Chloride: 106 mmol/L (ref 98–111)
GFR calc Af Amer: >60 mL/min (ref >60)
GFR calc non Af Amer: >60 mL/min (ref >60)
GLUCOSE: 142 mg/dL — AB (ref 70–99)
Potassium: 3.8 mmol/L (ref 3.5–5.1)
SODIUM: 138 mmol/L (ref 135–145)
TOTAL PROTEIN: 5.5 g/dL — AB (ref 6.5–8.1)

## 2018-01-30 LAB — CBC
HCT: 42.2 % (ref 39.0–52.0)
Hemoglobin: 14.1 g/dL (ref 13.0–17.0)
MCH: 32 pg (ref 26.0–34.0)
MCHC: 33.4 g/dL (ref 30.0–36.0)
MCV: 95.9 fL (ref 80.0–100.0)
Platelets: 107 10*3/uL — ABNORMAL LOW (ref 150–400)
RBC: 4.4 MIL/uL (ref 4.22–5.81)
RDW: 13.6 % (ref 11.5–15.5)
WBC: 17 10*3/uL — ABNORMAL HIGH (ref 4.0–10.5)
nRBC: 0 % (ref 0.0–0.2)

## 2018-01-30 SURGERY — ERCP, WITH INTERVENTION IF INDICATED
Anesthesia: General

## 2018-01-30 MED ORDER — ROCURONIUM BROMIDE 10 MG/ML (PF) SYRINGE
PREFILLED_SYRINGE | INTRAVENOUS | Status: DC | PRN
  Administered 2018-01-30: 40 mg via INTRAVENOUS
  Administered 2018-01-30: 10 mg via INTRAVENOUS

## 2018-01-30 MED ORDER — INDOMETHACIN 50 MG RE SUPP: 100.0000 mg | Freq: Once | RECTAL | Status: DC

## 2018-01-30 MED ORDER — INDOMETHACIN 50 MG RE SUPP
RECTAL | Status: DC | PRN
  Administered 2018-01-30: 100 mg via RECTAL

## 2018-01-30 MED ORDER — SODIUM CHLORIDE (PF) 0.9 % IJ SOLN
PREFILLED_SYRINGE | INTRAMUSCULAR | Status: DC | PRN
  Administered 2018-01-30: 10 mL

## 2018-01-30 MED ORDER — CIPROFLOXACIN IN D5W 400 MG/200ML IV SOLN
400.0000 mg | INTRAVENOUS | Status: AC
  Administered 2018-01-30: 400 mg via INTRAVENOUS

## 2018-01-30 MED ORDER — GLUCAGON HCL RDNA (DIAGNOSTIC) 1 MG IJ SOLR
INTRAMUSCULAR | Status: AC
  Filled 2018-01-30: qty 1

## 2018-01-30 MED ORDER — ONDANSETRON HCL 4 MG/2ML IJ SOLN
INTRAMUSCULAR | Status: DC | PRN
  Administered 2018-01-30: 4 mg via INTRAVENOUS

## 2018-01-30 MED ORDER — EPHEDRINE SULFATE-NACL 50-0.9 MG/10ML-% IV SOSY
PREFILLED_SYRINGE | INTRAVENOUS | Status: DC | PRN
  Administered 2018-01-30: 5 mg via INTRAVENOUS

## 2018-01-30 MED ORDER — HYDROMORPHONE HCL 1 MG/ML IJ SOLN
0.5000 mg | Freq: Once | INTRAMUSCULAR | Status: AC
  Administered 2018-01-30: 0.5 mg via INTRAVENOUS
  Filled 2018-01-30: qty 1

## 2018-01-30 MED ORDER — FENTANYL CITRATE (PF) 250 MCG/5ML IJ SOLN
INTRAMUSCULAR | Status: DC | PRN
  Administered 2018-01-30: 50 ug via INTRAVENOUS

## 2018-01-30 MED ORDER — SUGAMMADEX SODIUM 200 MG/2ML IV SOLN
INTRAVENOUS | Status: DC | PRN
  Administered 2018-01-30: 200 mg via INTRAVENOUS

## 2018-01-30 MED ORDER — SODIUM CHLORIDE 0.9 % IV SOLN
INTRAVENOUS | Status: DC | PRN
  Administered 2018-01-30: 25 mL

## 2018-01-30 MED ORDER — INDOMETHACIN 50 MG RE SUPP
RECTAL | Status: AC
  Filled 2018-01-30: qty 2

## 2018-01-30 MED ORDER — LIDOCAINE 2% (20 MG/ML) 5 ML SYRINGE
INTRAMUSCULAR | Status: DC | PRN
  Administered 2018-01-30: 60 mg via INTRAVENOUS

## 2018-01-30 MED ORDER — CIPROFLOXACIN IN D5W 400 MG/200ML IV SOLN
INTRAVENOUS | Status: AC
  Filled 2018-01-30: qty 200

## 2018-01-30 MED ORDER — SODIUM CHLORIDE 0.9 % IV SOLN: INTRAVENOUS | Status: DC

## 2018-01-30 MED ORDER — PROPOFOL 10 MG/ML IV BOLUS
INTRAVENOUS | Status: DC | PRN
  Administered 2018-01-30: 150 mg via INTRAVENOUS

## 2018-01-30 MED ORDER — IOPAMIDOL (ISOVUE-300) INJECTION 61%
INTRAVENOUS | Status: AC
  Filled 2018-01-30: qty 50

## 2018-01-30 MED ORDER — PHENYLEPHRINE 40 MCG/ML (10ML) SYRINGE FOR IV PUSH (FOR BLOOD PRESSURE SUPPORT)
PREFILLED_SYRINGE | INTRAVENOUS | Status: DC | PRN
  Administered 2018-01-30: 40 ug via INTRAVENOUS
  Administered 2018-01-30 (×2): 80 ug via INTRAVENOUS
  Administered 2018-01-30: 120 ug via INTRAVENOUS
  Administered 2018-01-30: 80 ug via INTRAVENOUS

## 2018-01-30 MED ORDER — DEXAMETHASONE SODIUM PHOSPHATE 10 MG/ML IJ SOLN
INTRAMUSCULAR | Status: DC | PRN
  Administered 2018-01-30: 10 mg via INTRAVENOUS

## 2018-01-30 NOTE — Anesthesia Postprocedure Evaluation (Signed)
Anesthesia Post Note  Patient: Charlotte Benyo  Procedure(s) Performed: ENDOSCOPIC RETROGRADE CHOLANGIOPANCREATOGRAPHY (ERCP) (N/A ) REMOVAL OF STONES SPHINCTEROTOMY     Patient location during evaluation: PACU Anesthesia Type: General Level of consciousness: awake and alert Pain management: pain level controlled Vital Signs Assessment: post-procedure vital signs reviewed and stable Respiratory status: spontaneous breathing, nonlabored ventilation, respiratory function stable and patient connected to nasal cannula oxygen Cardiovascular status: blood pressure returned to baseline and stable Postop Assessment: no apparent nausea or vomiting Anesthetic complications: no    Last Vitals:  Vitals:   01/30/18 1230 01/30/18 1245  BP: (!) 141/69 124/68  Pulse:    Resp: 12   Temp: 36.6 C   SpO2: 93%     Last Pain:  Vitals:   01/30/18 1230  TempSrc: Oral  PainSc: 0-No pain                 Tiajuana Amass

## 2018-01-30 NOTE — Plan of Care (Signed)
  Problem: Safety: Goal: Ability to remain free from injury will improve Outcome: Progressing   Problem: Clinical Measurements: Goal: Postoperative complications will be avoided or minimized Outcome: Progressing   Problem: Skin Integrity: Goal: Demonstration of wound healing without infection will improve Outcome: Progressing

## 2018-01-30 NOTE — Anesthesia Preprocedure Evaluation (Signed)
Anesthesia Evaluation  Patient identified by MRN, date of birth, ID band Patient awake    Reviewed: Allergy & Precautions, NPO status , Patient's Chart, lab work & pertinent test results  Airway Mallampati: II  TM Distance: >3 FB Neck ROM: Full    Dental  (+) Dental Advisory Given   Pulmonary Current Smoker,    breath sounds clear to auscultation       Cardiovascular hypertension, Pt. on medications  Rhythm:Regular Rate:Normal     Neuro/Psych negative neurological ROS     GI/Hepatic negative GI ROS, Neg liver ROS,   Endo/Other    Renal/GU negative Renal ROS     Musculoskeletal   Abdominal   Peds  Hematology  (+) Blood dyscrasia (Thrombocytopenia), ,   Anesthesia Other Findings   Reproductive/Obstetrics                             Lab Results  Component Value Date   WBC 17.0 (H) 01/30/2018   HGB 14.1 01/30/2018   HCT 42.2 01/30/2018   MCV 95.9 01/30/2018   PLT 107 (L) 01/30/2018   Lab Results  Component Value Date   CREATININE 0.85 01/30/2018   BUN 9 01/30/2018   NA 138 01/30/2018   K 3.8 01/30/2018   CL 106 01/30/2018   CO2 24 01/30/2018    Anesthesia Physical Anesthesia Plan  ASA: II  Anesthesia Plan: General   Post-op Pain Management:    Induction: Intravenous  PONV Risk Score and Plan: 1 and Ondansetron, Dexamethasone and Treatment may vary due to age or medical condition  Airway Management Planned: Oral ETT  Additional Equipment:   Intra-op Plan:   Post-operative Plan: Extubation in OR  Informed Consent: I have reviewed the patients History and Physical, chart, labs and discussed the procedure including the risks, benefits and alternatives for the proposed anesthesia with the patient or authorized representative who has indicated his/her understanding and acceptance.     Plan Discussed with:   Anesthesia Plan Comments:         Anesthesia Quick  Evaluation

## 2018-01-30 NOTE — Op Note (Addendum)
[facility] Patient Name: William Black Procedure Date : 01/30/2018 MRN: [id number] Attending MD: Arta Silence , MD Date of Birth: [birth year] CSN: [id number] Age: 77 Admit Type: Inpatient Procedure:                ERCP Indications:              Abnormal intraoperative cholangiogram (common bile                            duct stones) Providers:                Arta Silence, MD, Burtis Junes, RN, Alan Mulder,                            Technician, Lavona Mound, CRNA Referring MD:             Dr. Georgette Dover ([facility]) Medicines:                General Anesthesia, Cipro 400 mg IV, Indomethacin                            889 mg PR Complications:            No immediate complications. Estimated Blood Loss:     Estimated blood loss was minimal. Procedure:                Pre-Anesthesia Assessment:                           - Prior to the procedure, a History and Physical                            was performed, and patient medications and                            allergies were reviewed. The patient's tolerance of                            previous anesthesia was also reviewed. The risks                            and benefits of the procedure and the sedation                            options and risks were discussed with the patient.                            All questions were answered, and informed consent                            was obtained. Prior Anticoagulants: The patient has                            taken no previous anticoagulant or antiplatelet  agents. ASA Grade Assessment: II - A patient with                            mild systemic disease. After reviewing the risks                            and benefits, the patient was deemed in                            satisfactory condition to undergo the procedure.                           After obtaining informed consent, the scope was                            passed under direct  vision. Throughout the                            procedure, the patient's blood pressure, pulse, and                            oxygen saturations were monitored continuously. The                            [id number]-Q180V ([id number]) Olympus Duodensocope was                            introduced through the mouth, and used to inject                            contrast into and used to inject contrast into the                            bile duct. The ERCP was accomplished without                            difficulty. The patient tolerated the procedure                            well. Scope In: Scope Out: Findings:      A scout film of the abdomen was obtained. Surgical clips, consistent       with a previous cholecystectomy, were seen in the area of the right       upper quadrant of the abdomen. Small periampullary diverticulum,       otherwise the major papilla was normal. The bile duct was deeply       cannulated. Contrast was injected. I personally interpreted the bile       duct images. Ductal flow of contrast was adequate. No bile leak noted;       common bile duct non dilated ~35mm. The lower third of the main bile duct       contained filling defect(s) thought to be a stone. A 6 mm biliary       sphincterotomy was made with a traction (standard) sphincterotome using  blended current. The sphincterotomy oozed blood, treated with topical       flush with diluted epinephrine 1:10,000 solution; with this maneuver,       and the tincture of time, the oozing stopped.. The biliary tree was       swept with a basket starting at the upper third of the main bile duct,       middle third of the main bile duct and lower third of the main duct. A       few dark pigment stones were removed. No stones remained. Completion       occlusion cholangiogram showed no residual choledocholithiasis. Bile       flow per ampulla pre- and post-procedure was brisk. Pancreatogram was       not obtained,  intentionally. Impression:               - The major papilla appeared normal.                           - Filling defects consistent with a stone were seen                            on the cholangiogram.                           - Choledocholithiasis was found. Complete removal                            was accomplished by biliary sphincterotomy and                            basket extraction.                           - A biliary sphincterotomy was performed.                           - The biliary tree was swept. Moderate Sedation:      None Recommendation:           - Watch for potential complications of procedure                            (bleeding, infection, perforation, pancreatitis).                           - Avoid aspirin and nonsteroidal anti-inflammatory                            medicines for 5 days.                           - Return patient to hospital ward for ongoing care.                           - Clear liquid diet today.                           - Continue present medications.                           -  [facility] will follow. If doing ok tomorrow,                            discharge home tomorrow from [facility] is                            anticipated. Procedure Code(s):        --- Professional ---                           [phone], Endoscopic retrograde                            cholangiopancreatography (ERCP); with removal of                            calculi/debris from biliary/pancreatic duct(s)                           43262, Endoscopic retrograde                            cholangiopancreatography (ERCP); with                            sphincterotomy/papillotomy Diagnosis Code(s):        --- Professional ---                           K80.50, Calculus of bile duct without cholangitis                            or cholecystitis without obstruction                           R93.2, Abnormal findings on diagnostic imaging of                             liver and biliary tract CPT copyright 2018 [employer]. All rights reserved. The codes documented in this report are preliminary and upon coder review may  be revised to meet current compliance requirements. Arta Silence, MD 01/30/2018 12:28:12 PM This report has been signed electronically. Number of Addenda: 0

## 2018-01-30 NOTE — Interval H&P Note (Signed)
History and Physical Interval Note:  01/30/2018 11:22 AM  William Black  has presented today for surgery, with the diagnosis of abnormal intraoperative cholangiogram  The various methods of treatment have been discussed with the patient and family. After consideration of risks, benefits and other options for treatment, the patient has consented to  Procedure(s): ENDOSCOPIC RETROGRADE CHOLANGIOPANCREATOGRAPHY (ERCP) (N/A) as a surgical intervention .  The patient's history has been reviewed, patient examined, no change in status, stable for surgery.  I have reviewed the patient's chart and labs.  Questions were answered to the patient's satisfaction.     Landry Dyke

## 2018-01-30 NOTE — Anesthesia Procedure Notes (Signed)
Procedure Name: Intubation Date/Time: 01/30/2018 11:35 AM Performed by: White, Amedeo Plenty, CRNA Pre-anesthesia Checklist: Patient identified, Emergency Drugs available, Suction available and Patient being monitored Patient Re-evaluated:Patient Re-evaluated prior to induction Oxygen Delivery Method: Circle System Utilized Preoxygenation: Pre-oxygenation with 100% oxygen Induction Type: IV induction Ventilation: Mask ventilation without difficulty Laryngoscope Size: Mac and 4 Grade View: Grade I Tube type: Oral Tube size: 7.5 mm Number of attempts: 1 Airway Equipment and Method: Stylet Placement Confirmation: ETT inserted through vocal cords under direct vision,  positive ETCO2 and breath sounds checked- equal and bilateral Secured at: 22 cm Tube secured with: Tape Dental Injury: Teeth and Oropharynx as per pre-operative assessment

## 2018-01-30 NOTE — Progress Notes (Signed)
[facility] Progress Note  1 Day Post-Op  Subjective: CC:  Reports abd pain overnight, worse with coughing or moving. Urinating and having flatus without issue.   Objective: Vital signs in last 24 hours: Temp:  [97.5 F (36.4 C)-98.4 F (36.9 C)] 98.2 F (36.8 C) (11/19 0416) Pulse Rate:  [48-80] 80 (11/19 0416) Resp:  [9-20] 20 (11/19 0416) BP: (124-173)/(63-89) 159/72 (11/19 0416) SpO2:  [89 %-100 %] 94 % (11/19 0416) Last BM Date: 01/28/18  Intake/Output from previous day: 11/18 0701 - 11/19 0700 In: 2828.7 [P.O.:330; I.V.:2498.7] Out: 625 [Urine:600; Blood:25] Intake/Output this shift: No intake/output data recorded.  PE: Gen:  Alert, NAD, pleasant Card:  Regular rate and rhythm Pulm:  Normal effort Abd: Soft, appropriately tender, non-distended, bowel sounds present, most incisions C/D/I, umbilical dressing with sanguinous drainage. Skin: warm and dry, no rashes  Psych: A&Ox3   Lab Results:  Recent Labs    01/29/18 0130 01/30/18 0318  WBC 15.6* 17.0*  HGB 16.2 14.1  HCT 46.5 42.2  PLT 129* 107*   BMET Recent Labs    01/29/18 0130 01/30/18 0318  NA 137 138  K 3.7 3.8  CL 106 106  CO2 23 24  GLUCOSE 113* 142*  BUN 9 9  CREATININE 0.99 0.85  CALCIUM 8.9 8.5*   PT/INR No results for input(s): LABPROT, INR in the last 72 hours. CMP     Component Value Date/Time   NA 138 01/30/2018 0318   K 3.8 01/30/2018 0318   CL 106 01/30/2018 0318   CO2 24 01/30/2018 0318   GLUCOSE 142 (H) 01/30/2018 0318   BUN 9 01/30/2018 0318   CREATININE 0.85 01/30/2018 0318   CALCIUM 8.5 (L) 01/30/2018 0318   PROT 5.5 (L) 01/30/2018 0318   ALBUMIN 3.1 (L) 01/30/2018 0318   AST 50 (H) 01/30/2018 0318   ALT 53 (H) 01/30/2018 0318   ALKPHOS 45 01/30/2018 0318   BILITOT 0.7 01/30/2018 0318   GFRNONAA >60 01/30/2018 0318   GFRAA >60 01/30/2018 0318   Lipase     Component Value Date/Time   LIPASE 32 01/28/2018 0944       Studies/Results: Dg  Cholangiogram Operative  Result Date: 01/29/2018 CLINICAL DATA:  Intraoperative cholangiogram during laparoscopic cholecystectomy. EXAM: INTRAOPERATIVE CHOLANGIOGRAM FLUOROSCOPY TIME:  23 seconds COMPARISON:  Right upper quadrant abdominal ultrasound - 01/28/2018; CT abdomen and pelvis - 01/28/2018 FINDINGS: Intraoperative cholangiographic images of the right upper abdominal quadrant during laparoscopic cholecystectomy are provided for review. Surgical clips overlie the expected location of the gallbladder fossa. Contrast injection demonstrates selective cannulation of the central aspect of the cystic duct. There is passage of contrast through the central aspect of the cystic duct with filling of a non dilated common bile duct. There is passage of contrast though the CBD and into the descending portion of the duodenum. There is minimal reflux of injected contrast into the common hepatic duct and central aspect of the non dilated intrahepatic biliary system. There are several persistent nonocclusive filling defect in the distal aspect of the CBD worrisome for choledocholithiasis. Laminated opacities overlie the right upper abdominal quadrant compatible with cholelithiasis. IMPRESSION: 1. Nonocclusive filling defects within the distal aspect of the CBD worrisome for choledocholithiasis. 2. Cholelithiasis. Electronically Signed   By: Sandi Mariscal M.D.   On: 01/29/2018 10:19   Ct Abdomen Pelvis W Contrast  Result Date: 01/28/2018 CLINICAL DATA:  Diffuse abdominal pain extending into the chest since 1 a.m. today. Smoker. EXAM: CT ABDOMEN AND PELVIS  WITH CONTRAST TECHNIQUE: Multidetector CT imaging of the abdomen and pelvis was performed using the standard protocol following bolus administration of intravenous contrast. CONTRAST:  123mL OMNIPAQUE IOHEXOL 300 MG/ML  SOLN COMPARISON:  10/04/2017. FINDINGS: Lower chest: The lungs remain hyperexpanded with stable linear scar in the right lower lobe. Hepatobiliary:  Multiple gallstones in the gallbladder measuring up to 10 mm in maximum diameter each. No gallbladder wall thickening or pericholecystic fluid. Normal appearing liver. Pancreas: Unremarkable. No pancreatic ductal dilatation or surrounding inflammatory changes. Spleen: Normal in size without focal abnormality. Adrenals/Urinary Tract: Adrenal glands are unremarkable. Kidneys are normal, without renal calculi, focal lesion, or hydronephrosis. Bladder is unremarkable. Stomach/Bowel: Multiple sigmoid and descending colon diverticula without evidence of diverticulitis. Surgically absent appendix. Unremarkable stomach and small bowel. Vascular/Lymphatic: Atheromatous arterial calcifications without aneurysm. No enlarged lymph nodes. Reproductive: Moderately enlarged prostate gland. Other: Moderate-sized left inguinal hernia containing fat. Musculoskeletal: L5-S1 degenerative changes. IMPRESSION: 1. No acute abnormality. 2. Cholelithiasis. 3. Sigmoid and descending colon diverticulosis. 4. Moderately enlarged prostate gland. 5. Moderate-sized left inguinal hernia containing fat. 6. COPD. Electronically Signed   By: Claudie Revering M.D.   On: 01/28/2018 12:12   Dg Chest Port 1 View  Result Date: 01/28/2018 CLINICAL DATA:  Abdominal pain radiating to chest and back, dizziness EXAM: PORTABLE CHEST 1 VIEW COMPARISON:  Low-dose lung cancer CT chest dated 08/01/2017 FINDINGS: Lungs are clear. No pleural effusion or pneumothorax. The heart is normal in size. IMPRESSION: No evidence of acute cardiopulmonary disease. Electronically Signed   By: Julian Hy M.D.   On: 01/28/2018 10:31   US Abdomen Limited Ruq  Result Date: 01/28/2018 CLINICAL DATA:  Epigastric abdominal pain. EXAM: ULTRASOUND ABDOMEN LIMITED RIGHT UPPER QUADRANT COMPARISON:  CT abdomen pelvis from same day. FINDINGS: Gallbladder: Distended gallbladder containing multiple gallstones. There is a stone in the gallbladder neck near the cystic duct. No wall  thickening. No sonographic Murphy sign noted by sonographer. Common bile duct: Diameter: 7 mm, at the upper limits of normal. Liver: No focal lesion identified. Within normal limits in parenchymal echogenicity. Portal vein is patent on color Doppler imaging with normal direction of blood flow towards the liver. IMPRESSION: 1. Cholelithiasis, including a gallstone in the gallbladder neck near the cystic duct. The gallbladder is distended, but there is otherwise no sonographic evidence of acute cholecystitis at this time. Electronically Signed   By: Titus Dubin M.D.   On: 01/28/2018 16:07    Anti-infectives: Anti-infectives (From admission, onward)   Start     Dose/Rate Route Frequency Ordered Stop   01/28/18 2045  cefTRIAXone (ROCEPHIN) 2 g in sodium chloride 0.9 % 100 mL IVPB     2 g 200 mL/hr over 30 Minutes Intravenous Every 24 hours 01/28/18 2041         Assessment/Plan HTN High cholesterol  Left inguinal hernia - consider repair electively   Acute calculous cholecystitis  POD#1 s/p lap chole with IOC, IOC positive for distal CBD filling defect  Choledocholithiasis - ERCP today  - advance diet to [facility] after procedure  - PO pain control - mobilize/IS - plan to d/c tomorrow morning if everything goes smoothly today   FEN - NPO, IVF ID - Rocephin VTE - SCD's Follow up - Dr. Georgette Dover   LOS: 2 days    Obie Dredge, [facility] Surgery Pager: [phone]

## 2018-01-30 NOTE — Transfer of Care (Signed)
Immediate Anesthesia Transfer of Care Note  Patient: William Black  Procedure(s) Performed: ENDOSCOPIC RETROGRADE CHOLANGIOPANCREATOGRAPHY (ERCP) (N/A ) REMOVAL OF STONES  Patient Location: PACU  Anesthesia Type:General  Level of Consciousness: awake, alert  and patient cooperative  Airway & Oxygen Therapy: Patient Spontanous Breathing  Post-op Assessment: Report given to RN and Post -op Vital signs reviewed and stable  Post vital signs: Reviewed and stable  Last Vitals:  Vitals Value Taken Time  BP    Temp    Pulse    Resp    SpO2      Last Pain:  Vitals:   01/30/18 1059  TempSrc: Oral  PainSc: 10-Worst pain ever      Patients Stated Pain Goal: 3 (50/53/97 6734)  Complications: No apparent anesthesia complications

## 2018-01-31 ENCOUNTER — Other Ambulatory Visit (HOSPITAL_COMMUNITY): Payer: Self-pay | Admitting: Physician Assistant

## 2018-01-31 ENCOUNTER — Encounter (HOSPITAL_COMMUNITY): Payer: Self-pay | Admitting: Gastroenterology

## 2018-01-31 LAB — CBC
HEMATOCRIT: 40.2 % (ref 39.0–52.0)
Hemoglobin: 14 g/dL (ref 13.0–17.0)
MCH: 32.8 pg (ref 26.0–34.0)
MCHC: 34.8 g/dL (ref 30.0–36.0)
MCV: 94.1 fL (ref 80.0–100.0)
PLATELETS: 98 10*3/uL — AB (ref 150–400)
RBC: 4.27 MIL/uL (ref 4.22–5.81)
RDW: 13.3 % (ref 11.5–15.5)
WBC: 14.6 10*3/uL — AB (ref 4.0–10.5)
nRBC: 0 % (ref 0.0–0.2)

## 2018-01-31 LAB — COMPREHENSIVE METABOLIC PANEL
ALT: 45 U/L — ABNORMAL HIGH (ref 0–44)
AST: 36 U/L (ref 15–41)
Albumin: 2.9 g/dL — ABNORMAL LOW (ref 3.5–5.0)
Alkaline Phosphatase: 46 U/L (ref 38–126)
Anion gap: 6 (ref 5–15)
BUN: 9 mg/dL (ref 8–23)
CALCIUM: 8.5 mg/dL — AB (ref 8.9–10.3)
CHLORIDE: 106 mmol/L (ref 98–111)
CO2: 25 mmol/L (ref 22–32)
CREATININE: 0.96 mg/dL (ref 0.61–1.24)
GFR calc Af Amer: >60 mL/min (ref >60)
Glucose, Bld: 163 mg/dL — ABNORMAL HIGH (ref 70–99)
Potassium: 3.8 mmol/L (ref 3.5–5.1)
Sodium: 137 mmol/L (ref 135–145)
Total Bilirubin: 0.7 mg/dL (ref 0.3–1.2)
Total Protein: 5.5 g/dL — ABNORMAL LOW (ref 6.5–8.1)

## 2018-01-31 MED ORDER — OXYCODONE HCL 5 MG PO TABS: 5.0000 mg | ORAL_TABLET | ORAL | 0 refills | Status: DC | PRN

## 2018-01-31 MED ORDER — IBUPROFEN 600 MG PO TABS
600.0000 mg | ORAL_TABLET | Freq: Three times a day (TID) | ORAL | Status: DC
  Administered 2018-01-31: 600 mg via ORAL
  Filled 2018-01-31: qty 1

## 2018-01-31 MED ORDER — ACETAMINOPHEN 325 MG PO TABS: 650.0000 mg | ORAL_TABLET | Freq: Four times a day (QID) | ORAL | Status: DC | PRN

## 2018-01-31 MED ORDER — OXYCODONE HCL 5 MG PO TABS
5.0000 mg | ORAL_TABLET | ORAL | Status: DC | PRN
  Administered 2018-01-31: 10 mg via ORAL
  Filled 2018-01-31: qty 2

## 2018-01-31 MED ORDER — IBUPROFEN 600 MG PO TABS: 600.0000 mg | ORAL_TABLET | Freq: Three times a day (TID) | ORAL | 0 refills | Status: AC | PRN

## 2018-01-31 NOTE — Discharge Summary (Signed)
[facility] Discharge Summary   Patient ID: William Black MRN: [id number] DOB/AGE: May 09, 1940 77 y.o.  Admit date: 01/28/2018 Discharge date: 01/31/2018  Discharge Diagnosis Patient Active Problem List   Diagnosis Date Noted  . Chronic calculous cholecystitis 01/28/2018  . Cellulitis 02/26/2017    Consultants GI   Imaging: Dg Ercp With Sphincterotomy  Result Date: 01/30/2018 CLINICAL DATA:  Choledocholithiasis.  ERCP with stone removal EXAM: ERCP TECHNIQUE: Multiple spot images obtained with the fluoroscopic device and submitted for interpretation post-procedure. FLUOROSCOPY TIME:  2 minutes, 36 seconds COMPARISON:  Intraoperative cholangiogram during laparoscopic cholecystectomy-01/29/2018 FINDINGS: Two spot intraoperative fluoroscopic images of the right upper abdominal quadrant during ERCP are provided for review Initial image demonstrates an ERCP probe overlying the right upper abdominal quadrant. There is selective cannulation and opacification of common bile duct which appears mildly dilated. There is minimal opacification of the intrahepatic biliary tree which appears nondilated. There is minimal opacification of the central aspect of the residual cystic duct. There is no definitive contrast extravasation. Ill-defined nonocclusive opacities are seen with the distal aspect the CBD worrisome for choledocholithiasis as was questioned on preceding intraoperative cholangiogram. IMPRESSION: ERCP as above. These images were submitted for radiologic interpretation only. Please see the procedural report for the amount of contrast and the fluoroscopy time utilized. Electronically Signed   By: Sandi Mariscal M.D.   On: 01/30/2018 12:44    Procedures Dr. Donnie Mesa 01/29/18- Laparoscopic Cholecystectomy with IOC Dr. Arta Silence 01/30/18- ERCP w/ sphincterotomy   Hospital Course:  77 y/o male with a cc acute RUQ pain. Workup showed acute calculous cholecystitis.  Patient  was admitted and underwent procedure listed above. olerated procedure well and was transferred to the floor. IOC was positive for filling defect of the common bile duct. GI was consulted and patient underwent above procedure. Diet was advanced as tolerated.  On 01/31/18 the patient was voiding well, tolerating diet, ambulating well, pain well controlled, vital signs stable, incisions c/d/i and felt stable for discharge home. Patient will follow up in our office in 2 weeks and knows to call with questions or concerns.   Physical Exam: General:  Alert, NAD, pleasant, comfortable Abd:  Soft, ND, mild tenderness, incisions C/D/I   Allergies as of 01/31/2018   No Known Allergies     Medication List    STOP taking these medications   amoxicillin-clavulanate 875-125 MG tablet Commonly known as:  AUGMENTIN   cephALEXin 500 MG capsule Commonly known as:  KEFLEX     TAKE these medications   acetaminophen 325 MG tablet Commonly known as:  TYLENOL Take 2-3 tablets (650-975 mg total) by mouth every 6 (six) hours as needed.   atorvastatin 20 MG tablet Commonly known as:  LIPITOR Take 20 mg by mouth at bedtime.   diltiazem 360 MG 24 hr capsule Commonly known as:  CARDIZEM CD Take 360 mg by mouth daily.   ibuprofen 600 MG tablet Commonly known as:  ADVIL,MOTRIN Take 1 tablet (600 mg total) by mouth every 8 (eight) hours as needed for up to 5 days.   lisinopril 40 MG tablet Commonly known as:  PRINIVIL,ZESTRIL Take 40 mg by mouth 2 (two) times daily.   saccharomyces boulardii 250 MG capsule Commonly known as:  FLORASTOR Take 1 capsule (250 mg total) by mouth 2 (two) times daily.        Follow-up Information    Donnie Mesa, MD. Go on 02/12/2018.   Specialty:  General Surgery Why:  Your appointment  is 02/12/18 @ 9:50am Please arrive 30 minutes prior to your appiontment to check in and fill out paperwork. Bring photo ID and insurance information. Contact information: [address] [place] [address] [phone]           Signed: Obie Dredge, [facility] 01/31/2018, 4:06 PM

## 2018-01-31 NOTE — Plan of Care (Signed)
  Problem: Health Behavior/Discharge Planning: Goal: Ability to manage health-related needs will improve Outcome: Progressing   Problem: Clinical Measurements: Goal: Ability to maintain clinical measurements within normal limits will improve Outcome: Progressing   Problem: Activity: Goal: Risk for activity intolerance will decrease Outcome: Progressing   

## 2018-01-31 NOTE — Progress Notes (Signed)
William Black to be D/C'd  per MD order. Discussed with the patient and all questions fully answered.  VSS, Skin clean, dry and intact without evidence of skin break down, no evidence of skin tears noted.  IV catheter discontinued intact. Site without signs and symptoms of complications. Dressing and pressure applied.  An After Visit Summary was printed and given to the patient. Patient received prescription.  D/c education completed with patient/family including follow up instructions, medication list, d/c activities limitations if indicated, with other d/c instructions as indicated by MD - patient able to verbalize understanding, all questions fully answered.   Patient instructed to return to ED, call 911, or call MD for any changes in condition.   Patient to be escorted via [place], and D/C home via private auto.  Dassel Lanae Boast, RN

## 2018-02-12 ENCOUNTER — Ambulatory Visit: Payer: Self-pay | Admitting: Surgery

## 2018-02-12 NOTE — H&P (Signed)
History of Present Illness William Black. William Gubler MD; 02/12/2018 11:03 AM) The patient is a 77 year old male who presents with an inguinal hernia. PCP - Ashby Dawes  This is a 77 year old male who was admitted to the hospital on 01/28/18 with findings concerning for acute cholecystitis. On 11/18 he underwent laparoscopic cholecystectomy with intraoperative cholangiogram. The cholangiogram showed filling defects in the distal common bile duct. He underwent ERCP on 11/19 by Dr. Arta Silence. He was discharged home on 11/20. He has been doing well. He did have one episode of diarrhea this morning after eating sausage and pancakes for breakfast. Other than that, he has not had any issues with diarrhea. His abdominal pain is essentially resolved.  On the CT scan from the emergency department he was noted to have a left inguinal hernia. This was also seen laparoscopically. There is no incarcerated small bowel within the hernia.  The patient had a right inguinal hernia repaired when he was a baby. He also had a right orchiectomy for strangulation. He states that the left side has always bulged more than the right. It has become larger over the last few years. There are minimal symptoms associated with this.   Problem List/Past Medical William Key K. Marykatherine Sherwood, MD; 02/12/2018 11:03 AM) ACUTE CHOLECYSTITIS DUE TO BILIARY CALCULUS (K80.00) CHOLEDOCHOLITHIASIS WITH OBSTRUCTION (K80.51) LEFT INGUINAL HERNIA (K40.90)  Past Surgical History Emeline Gins, [place]; 02/12/2018 9:59 AM) Appendectomy Colon Polyp Removal - Colonoscopy Gallbladder Surgery - Laparoscopic  Diagnostic Studies History Emeline Gins, CMA; 02/12/2018 9:59 AM) Colonoscopy 1-5 years ago  Allergies Emeline Gins, CMA; 02/12/2018 10:00 AM) No Known Drug Allergies [02/12/2018]: Allergies Reconciled  Medication History Emeline Gins, CMA; 02/12/2018 10:00 AM) Lisinopril (40MG  Tablet, Oral) Active. dilTIAZem HCl ER  Beads (360MG  Capsule ER 24HR, Oral) Active. Atorvastatin Calcium (20MG  Tablet, Oral) Active. Medications Reconciled  Social History Emeline Gins, [place]; 02/12/2018 9:59 AM) Alcohol use Occasional alcohol use. No caffeine use No drug use Tobacco use Current every day smoker.  Family History Emeline Gins, [place]; 02/12/2018 9:59 AM) Alcohol Abuse Father. Melanoma Brother. Prostate Cancer Brother.  Other Problems William Black. William Turvey, MD; 02/12/2018 11:03 AM) Bladder Problems Cholelithiasis Enlarged Prostate Hemorrhoids High blood pressure    Vitals Emeline Gins CMA; 02/12/2018 10:00 AM) 02/12/2018 9:59 AM Weight: 139.5 lb Height: 69in Body Surface Area: 1.77 m Body Mass Index: 20.6 kg/m  Temp.: 98.49F  Pulse: 72 (Regular)  BP: 108/70 (Sitting, Left Arm, Standard)      Physical Exam William Key K. Anicka Stuckert MD; 02/12/2018 11:04 AM)  The physical exam findings are as follows: Note:WDWN in NAD Eyes: Pupils equal, round; sclera anicteric HENT: Oral mucosa moist; good dentition Neck: No masses palpated, no thyromegaly Lungs: CTA bilaterally; normal respiratory effort CV: Regular rate and rhythm; no murmurs; extremities well-perfused with no edema Abd: +bowel sounds, soft, non-tender, no palpable organomegaly Healing laparoscopic incisions with some subcutaneous ecchymosis. This seems to be resolving after surgery. GU: Absent right testicle, descended left testicle. The patient has a large left inguinal hernia that is reducible. Enlarges with Valsalva maneuver. Skin: Warm, dry; no sign of jaundice Psychiatric - alert and oriented x 4; calm mood and affect    Assessment & Plan William Key K. Samariah Hokenson MD; 02/12/2018 10:25 AM)  ACUTE CHOLECYSTITIS DUE TO BILIARY CALCULUS (K80.00)  Current Plans Pt Education - [facility] Mesh education: discussed with patient and provided information.  CHOLEDOCHOLITHIASIS WITH OBSTRUCTION (K80.51)   LEFT INGUINAL HERNIA  (K40.90)  Current Plans Schedule for Surgery - Left inguinal hernia repair with  mesh. The surgical procedure has been discussed with the patient. Potential risks, benefits, alternative treatments, and expected outcomes have been explained. All of the patient's questions at this time have been answered. The likelihood of reaching the patient's treatment goal is good. The patient understand the proposed surgical procedure and wishes to proceed.  William Black. Georgette Dover, MD, [facility] Surgery  General/ Trauma Surgery Beeper [phone]  02/12/2018 11:04 AM

## 2018-03-13 ENCOUNTER — Encounter (HOSPITAL_BASED_OUTPATIENT_CLINIC_OR_DEPARTMENT_OTHER): Payer: Self-pay | Admitting: *Deleted

## 2018-03-13 ENCOUNTER — Other Ambulatory Visit: Payer: Self-pay

## 2018-03-15 NOTE — Progress Notes (Signed)
Ensure pre surgery drink given with instructions to complete by 0530 dos, surgical soap given with instructions, pt verbalized understanding. 

## 2018-03-22 ENCOUNTER — Encounter (HOSPITAL_BASED_OUTPATIENT_CLINIC_OR_DEPARTMENT_OTHER)
Admission: RE | Disposition: A | Discharge status: Home / Self Care | Payer: Self-pay | Source: Home / Self Care | Attending: Surgery

## 2018-03-22 ENCOUNTER — Other Ambulatory Visit: Payer: Self-pay

## 2018-03-22 ENCOUNTER — Encounter (HOSPITAL_BASED_OUTPATIENT_CLINIC_OR_DEPARTMENT_OTHER): Payer: Self-pay | Admitting: Anesthesiology

## 2018-03-22 ENCOUNTER — Ambulatory Visit (HOSPITAL_BASED_OUTPATIENT_CLINIC_OR_DEPARTMENT_OTHER): Payer: Medicare HMO | Admitting: Anesthesiology

## 2018-03-22 ENCOUNTER — Ambulatory Visit (HOSPITAL_BASED_OUTPATIENT_CLINIC_OR_DEPARTMENT_OTHER)
Admission: RE | Admit: 2018-03-22 | Discharge: 2018-03-22 | Disposition: A | Discharge status: Home / Self Care | Payer: Medicare HMO | Attending: Surgery | Admitting: Surgery

## 2018-03-22 DIAGNOSIS — Z79899 Other long term (current) drug therapy: Secondary | ICD-10-CM | POA: Insufficient documentation

## 2018-03-22 DIAGNOSIS — G8918 Other acute postprocedural pain: Secondary | ICD-10-CM | POA: Diagnosis not present

## 2018-03-22 DIAGNOSIS — I10 Essential (primary) hypertension: Secondary | ICD-10-CM | POA: Insufficient documentation

## 2018-03-22 DIAGNOSIS — K4090 Unilateral inguinal hernia, without obstruction or gangrene, not specified as recurrent: Secondary | ICD-10-CM | POA: Insufficient documentation

## 2018-03-22 DIAGNOSIS — F17200 Nicotine dependence, unspecified, uncomplicated: Secondary | ICD-10-CM | POA: Insufficient documentation

## 2018-03-22 DIAGNOSIS — R69 Illness, unspecified: Secondary | ICD-10-CM | POA: Diagnosis not present

## 2018-03-22 DIAGNOSIS — D176 Benign lipomatous neoplasm of spermatic cord: Secondary | ICD-10-CM | POA: Diagnosis not present

## 2018-03-22 HISTORY — PX: INSERTION OF MESH: SHX5868

## 2018-03-22 HISTORY — PX: INGUINAL HERNIA REPAIR: SHX194

## 2018-03-22 HISTORY — DX: Unspecified hearing loss, right ear: H91.91

## 2018-03-22 SURGERY — REPAIR, HERNIA, INGUINAL, ADULT
Anesthesia: Regional | Site: Groin | Laterality: Left

## 2018-03-22 MED ORDER — BUPIVACAINE HCL (PF) 0.5 % IJ SOLN
INTRAMUSCULAR | Status: DC | PRN
  Administered 2018-03-22: 20 mL

## 2018-03-22 MED ORDER — BUPIVACAINE-EPINEPHRINE (PF) 0.25% -1:200000 IJ SOLN
INTRAMUSCULAR | Status: AC
  Filled 2018-03-22: qty 90

## 2018-03-22 MED ORDER — ACETAMINOPHEN 500 MG PO TABS
1000.0000 mg | ORAL_TABLET | ORAL | Status: AC
  Administered 2018-03-22: 1000 mg via ORAL

## 2018-03-22 MED ORDER — MIDAZOLAM HCL 2 MG/2ML IJ SOLN
INTRAMUSCULAR | Status: AC
  Filled 2018-03-22: qty 2

## 2018-03-22 MED ORDER — FENTANYL CITRATE (PF) 100 MCG/2ML IJ SOLN
INTRAMUSCULAR | Status: AC
  Filled 2018-03-22: qty 2

## 2018-03-22 MED ORDER — FENTANYL CITRATE (PF) 100 MCG/2ML IJ SOLN: 25.0000 ug | INTRAMUSCULAR | Status: DC | PRN

## 2018-03-22 MED ORDER — ACETAMINOPHEN 500 MG PO TABS
ORAL_TABLET | ORAL | Status: AC
  Filled 2018-03-22: qty 2

## 2018-03-22 MED ORDER — LIDOCAINE HCL (CARDIAC) PF 100 MG/5ML IV SOSY
PREFILLED_SYRINGE | INTRAVENOUS | Status: DC | PRN
  Administered 2018-03-22: 80 mg via INTRAVENOUS

## 2018-03-22 MED ORDER — HYDROCODONE-ACETAMINOPHEN 5-325 MG PO TABS: 1.0000 | ORAL_TABLET | Freq: Four times a day (QID) | ORAL | 0 refills | Status: DC | PRN

## 2018-03-22 MED ORDER — LIDOCAINE 2% (20 MG/ML) 5 ML SYRINGE
INTRAMUSCULAR | Status: AC
  Filled 2018-03-22: qty 5

## 2018-03-22 MED ORDER — CHLORHEXIDINE GLUCONATE CLOTH 2 % EX PADS: 6.0000 | MEDICATED_PAD | Freq: Once | CUTANEOUS | Status: DC

## 2018-03-22 MED ORDER — MIDAZOLAM HCL 2 MG/2ML IJ SOLN: 1.0000 mg | INTRAMUSCULAR | Status: DC | PRN

## 2018-03-22 MED ORDER — PHENYLEPHRINE HCL 10 MG/ML IJ SOLN
INTRAMUSCULAR | Status: DC | PRN
  Administered 2018-03-22: 80 ug via INTRAVENOUS

## 2018-03-22 MED ORDER — ONDANSETRON HCL 4 MG/2ML IJ SOLN
INTRAMUSCULAR | Status: AC
  Filled 2018-03-22: qty 2

## 2018-03-22 MED ORDER — SCOPOLAMINE 1 MG/3DAYS TD PT72: 1.0000 | MEDICATED_PATCH | Freq: Once | TRANSDERMAL | Status: DC | PRN

## 2018-03-22 MED ORDER — PROPOFOL 10 MG/ML IV BOLUS
INTRAVENOUS | Status: AC
  Filled 2018-03-22: qty 20

## 2018-03-22 MED ORDER — CLONIDINE HCL (ANALGESIA) 100 MCG/ML EP SOLN
EPIDURAL | Status: DC | PRN
  Administered 2018-03-22: 50 ug

## 2018-03-22 MED ORDER — EPHEDRINE SULFATE 50 MG/ML IJ SOLN
INTRAMUSCULAR | Status: DC | PRN
  Administered 2018-03-22 (×3): 10 mg via INTRAVENOUS

## 2018-03-22 MED ORDER — CEFAZOLIN SODIUM-DEXTROSE 2-4 GM/100ML-% IV SOLN
2.0000 g | INTRAVENOUS | Status: AC
  Administered 2018-03-22: 2 g via INTRAVENOUS

## 2018-03-22 MED ORDER — ONDANSETRON HCL 4 MG/2ML IJ SOLN
INTRAMUSCULAR | Status: DC | PRN
  Administered 2018-03-22: 4 mg via INTRAVENOUS

## 2018-03-22 MED ORDER — LACTATED RINGERS IV SOLN
INTRAVENOUS | Status: DC
  Administered 2018-03-22 (×2): via INTRAVENOUS

## 2018-03-22 MED ORDER — DEXAMETHASONE SODIUM PHOSPHATE 4 MG/ML IJ SOLN
INTRAMUSCULAR | Status: DC | PRN
  Administered 2018-03-22: 10 mg via INTRAVENOUS

## 2018-03-22 MED ORDER — CEFAZOLIN SODIUM-DEXTROSE 2-4 GM/100ML-% IV SOLN
INTRAVENOUS | Status: AC
  Filled 2018-03-22: qty 100

## 2018-03-22 MED ORDER — PROPOFOL 10 MG/ML IV BOLUS
INTRAVENOUS | Status: DC | PRN
  Administered 2018-03-22: 100 mg via INTRAVENOUS
  Administered 2018-03-22: 50 mg via INTRAVENOUS

## 2018-03-22 MED ORDER — BUPIVACAINE-EPINEPHRINE 0.25% -1:200000 IJ SOLN
INTRAMUSCULAR | Status: DC | PRN
  Administered 2018-03-22: 10 mL

## 2018-03-22 MED ORDER — GABAPENTIN 300 MG PO CAPS
300.0000 mg | ORAL_CAPSULE | ORAL | Status: AC
  Administered 2018-03-22: 300 mg via ORAL

## 2018-03-22 MED ORDER — GABAPENTIN 300 MG PO CAPS
ORAL_CAPSULE | ORAL | Status: AC
  Filled 2018-03-22: qty 1

## 2018-03-22 MED ORDER — FENTANYL CITRATE (PF) 100 MCG/2ML IJ SOLN
50.0000 ug | INTRAMUSCULAR | Status: DC | PRN
  Administered 2018-03-22: 25 ug via INTRAVENOUS
  Administered 2018-03-22: 50 ug via INTRAVENOUS

## 2018-03-22 MED ORDER — EPINEPHRINE PF 1 MG/10ML IJ SOSY
PREFILLED_SYRINGE | INTRAMUSCULAR | Status: DC | PRN
  Administered 2018-03-22: 100 ug via INTRAVENOUS

## 2018-03-22 MED ORDER — DEXAMETHASONE SODIUM PHOSPHATE 10 MG/ML IJ SOLN
INTRAMUSCULAR | Status: AC
  Filled 2018-03-22: qty 1

## 2018-03-22 SURGICAL SUPPLY — 43 items
BENZOIN TINCTURE PRP APPL 2/3 (GAUZE/BANDAGES/DRESSINGS) ×3 IMPLANT
BLADE CLIPPER SURG (BLADE) ×1 IMPLANT
BLADE HEX COATED 2.75 (ELECTRODE) ×3 IMPLANT
BLADE SURG 15 STRL LF DISP TIS (BLADE) ×4 IMPLANT
BLADE SURG 15 STRL SS (BLADE) ×1
CHLORAPREP W/TINT 26ML (MISCELLANEOUS) ×3 IMPLANT
COVER BACK TABLE 60X90IN (DRAPES) ×3 IMPLANT
COVER MAYO STAND STRL (DRAPES) ×3 IMPLANT
DECANTER SPIKE VIAL GLASS SM (MISCELLANEOUS) ×3 IMPLANT
DRAIN PENROSE 1/2X12 LTX STRL (WOUND CARE) ×3 IMPLANT
DRAPE LAPAROTOMY TRNSV 102X78 (DRAPE) ×3 IMPLANT
DRAPE UTILITY XL STRL (DRAPES) ×3 IMPLANT
DRSG TEGADERM 4X4.75 (GAUZE/BANDAGES/DRESSINGS) ×3 IMPLANT
ELECT REM PT RETURN 9FT ADLT (ELECTROSURGICAL) ×2
ELECTRODE REM PT RTRN 9FT ADLT (ELECTROSURGICAL) ×2 IMPLANT
GAUZE SPONGE 4X4 12PLY STRL LF (GAUZE/BANDAGES/DRESSINGS) ×3 IMPLANT
GLOVE BIO SURGEON STRL SZ 6.5 (GLOVE) ×1 IMPLANT
GLOVE BIO SURGEON STRL SZ7 (GLOVE) ×4 IMPLANT
GLOVE BIOGEL PI IND STRL 7.0 (GLOVE) IMPLANT
GLOVE BIOGEL PI IND STRL 7.5 (GLOVE) ×2 IMPLANT
GLOVE BIOGEL PI INDICATOR 7.0 (GLOVE) ×1
GLOVE BIOGEL PI INDICATOR 7.5 (GLOVE) ×2
GOWN STRL REUS W/ TWL LRG LVL3 (GOWN DISPOSABLE) ×4 IMPLANT
GOWN STRL REUS W/TWL LRG LVL3 (GOWN DISPOSABLE) ×3
MESH PARIETEX PROGRIP LEFT (Mesh General) ×1 IMPLANT
NDL HYPO 25X1 1.5 SAFETY (NEEDLE) ×2 IMPLANT
NEEDLE HYPO 25X1 1.5 SAFETY (NEEDLE) ×2 IMPLANT
PACK BASIN DAY SURGERY FS (CUSTOM PROCEDURE TRAY) ×3 IMPLANT
PENCIL BUTTON HOLSTER BLD 10FT (ELECTRODE) ×3 IMPLANT
SLEEVE SCD COMPRESS KNEE MED (MISCELLANEOUS) ×3 IMPLANT
SPONGE INTESTINAL PEANUT (DISPOSABLE) ×3 IMPLANT
STRIP CLOSURE SKIN 1/2X4 (GAUZE/BANDAGES/DRESSINGS) ×3 IMPLANT
SUT MON AB 4-0 PC3 18 (SUTURE) ×3 IMPLANT
SUT VIC AB 0 CT1 27 (SUTURE) ×1
SUT VIC AB 0 CT1 27XBRD ANBCTR (SUTURE) ×2 IMPLANT
SUT VIC AB 0 SH 27 (SUTURE) ×1 IMPLANT
SUT VIC AB 2-0 SH 27 (SUTURE) ×1
SUT VIC AB 2-0 SH 27XBRD (SUTURE) ×2 IMPLANT
SUT VIC AB 3-0 SH 27 (SUTURE) ×1
SUT VIC AB 3-0 SH 27X BRD (SUTURE) ×2 IMPLANT
SYR CONTROL 10ML LL (SYRINGE) ×3 IMPLANT
TOWEL GREEN STERILE FF (TOWEL DISPOSABLE) ×3 IMPLANT
TOWEL OR NON WOVEN STRL DISP B (DISPOSABLE) ×3 IMPLANT

## 2018-03-22 NOTE — Anesthesia Procedure Notes (Signed)
Procedure Name: LMA Insertion Date/Time: 03/22/2018 9:10 AM Performed by: Maryella Shivers, CRNA Pre-anesthesia Checklist: Patient identified, Emergency Drugs available, Suction available and Patient being monitored Patient Re-evaluated:Patient Re-evaluated prior to induction Oxygen Delivery Method: Circle system utilized Preoxygenation: Pre-oxygenation with 100% oxygen Induction Type: IV induction Ventilation: Mask ventilation without difficulty LMA: LMA inserted LMA Size: 4.0 Number of attempts: 1 Airway Equipment and Method: Bite block Placement Confirmation: positive ETCO2 Tube secured with: Tape Dental Injury: Teeth and Oropharynx as per pre-operative assessment

## 2018-03-22 NOTE — Anesthesia Procedure Notes (Signed)
Anesthesia Regional Block: Quadratus lumborum   Pre-Anesthetic Checklist: ,, timeout performed, Correct Patient, Correct Site, Correct Laterality, Correct Procedure, Correct Position, site marked, Risks and benefits discussed,  Surgical consent,  Pre-op evaluation,  At surgeon's request and post-op pain management  Laterality: Left  Prep: Maximum Sterile Barrier Precautions used, chloraprep       Needles:  Injection technique: Single-shot  Needle Type: Echogenic Stimulator Needle     Needle Length: 9cm  Needle Gauge: 22     Additional Needles:   Procedures:,,,, ultrasound used (permanent image in chart),,,,  Narrative:  Start time: 03/22/2018 8:45 AM End time: 03/22/2018 8:55 AM Injection made incrementally with aspirations every 5 mL.  Performed by: Personally  Anesthesiologist: Freddrick March, MD  Additional Notes: Monitors applied. No increased pain on injection. No increased resistance to injection. Injection made in 5cc increments. Good needle visualization. Patient tolerated procedure well.

## 2018-03-22 NOTE — Anesthesia Postprocedure Evaluation (Signed)
Anesthesia Post Note  Patient: William Black  Procedure(s) Performed: LEFT INGUINAL HERNIA REPAIR ERAS PATHWAY (Left Groin) INSERTION OF MESH (Left Groin)     Patient location during evaluation: PACU Anesthesia Type: Regional and General Level of consciousness: awake and alert Pain management: pain level controlled Vital Signs Assessment: post-procedure vital signs reviewed and stable Respiratory status: spontaneous breathing, nonlabored ventilation, respiratory function stable and patient connected to nasal cannula oxygen Cardiovascular status: blood pressure returned to baseline and stable Postop Assessment: no apparent nausea or vomiting Anesthetic complications: no    Last Vitals:  Vitals:   03/22/18 1105 03/22/18 1108  BP:    Pulse: 65 63  Resp: 13 13  Temp:    SpO2: 96% 97%    Last Pain:  Vitals:   03/22/18 1100  TempSrc:   PainSc: 0-No pain                 Rohan Juenger L Sawyer Kahan

## 2018-03-22 NOTE — Progress Notes (Signed)
Assisted D. Woodrum with left, ultrasound guided, transabdominal plane block. Side rails up, monitors on throughout procedure. See vital signs in flow sheet. Tolerated Procedure well. 

## 2018-03-22 NOTE — H&P (Signed)
History of Present Illness  The patient is a 78 year old male who presents with an inguinal hernia.  PCP - Ashby Dawes  This is a 78 year old male who was admitted to the hospital on 01/28/18 with findings concerning for acute cholecystitis. On 11/18 he underwent laparoscopic cholecystectomy with intraoperative cholangiogram. The cholangiogram showed filling defects in the distal common bile duct. He underwent ERCP on 11/19 by Dr. Arta Silence. He was discharged home on 11/20. He has been doing well. He did have one episode of diarrhea this morning after eating sausage and pancakes for breakfast. Other than that, he has not had any issues with diarrhea. His abdominal pain is essentially resolved.  On the CT scan from the emergency department he was noted to have a left inguinal hernia. This was also seen laparoscopically. There is no incarcerated small bowel within the hernia.  The patient had a right inguinal hernia repaired when he was a baby. He also had a right orchiectomy for strangulation. He states that the left side has always bulged more than the right. It has become larger over the last few years. There are minimal symptoms associated with this.   Problem List/Past Medical  ACUTE CHOLECYSTITIS DUE TO BILIARY CALCULUS (K80.00) CHOLEDOCHOLITHIASIS WITH OBSTRUCTION (K80.51) LEFT INGUINAL HERNIA (K40.90)  Past Surgical History  Appendectomy Colon Polyp Removal - Colonoscopy Gallbladder Surgery - Laparoscopic  Diagnostic Studies History  Colonoscopy 1-5 years ago  Allergies  No Known Drug Allergies  Allergies Reconciled  Medication History Lisinopril (40MG  Tablet, Oral) Active. dilTIAZem HCl ER Beads (360MG  Capsule ER 24HR, Oral) Active. Atorvastatin Calcium (20MG  Tablet, Oral) Active. Medications Reconciled  Social History  Alcohol use Occasional alcohol use. No caffeine use No drug use Tobacco use Current every day  smoker.  Family History  Alcohol Abuse Father. Melanoma Brother. Prostate Cancer Brother.  Other Problems  Bladder Problems Cholelithiasis Enlarged Prostate Hemorrhoids High blood pressure    Vitals  Weight: 139.5 lb Height: 69in Body Surface Area: 1.77 m Body Mass Index: 20.6 kg/m  Temp.: 98.69F  Pulse: 72 (Regular)  BP: 108/70 (Sitting, Left Arm, Standard)      Physical Exam   The physical exam findings are as follows: Note:WDWN in NAD Eyes: Pupils equal, round; sclera anicteric HENT: Oral mucosa moist; good dentition Neck: No masses palpated, no thyromegaly Lungs: CTA bilaterally; normal respiratory effort CV: Regular rate and rhythm; no murmurs; extremities well-perfused with no edema Abd: +bowel sounds, soft, non-tender, no palpable organomegaly Healing laparoscopic incisions with some subcutaneous ecchymosis. This seems to be resolving after surgery. GU: Absent right testicle, descended left testicle. The patient has a large left inguinal hernia that is reducible. Enlarges with Valsalva maneuver. Skin: Warm, dry; no sign of jaundice Psychiatric - alert and oriented x 4; calm mood and affect    Assessment & Plan     LEFT INGUINAL HERNIA (K40.90)  Current Plans Schedule for Surgery - Left inguinal hernia repair with mesh. The surgical procedure has been discussed with the patient. Potential risks, benefits, alternative treatments, and expected outcomes have been explained. All of the patient's questions at this time have been answered. The likelihood of reaching the patient's treatment goal is good. The patient understand the proposed surgical procedure and wishes to proceed.    Imogene Burn. Georgette Dover, MD, [facility]  General/ Trauma Surgery Beeper [phone]  03/22/2018 8:01 AM

## 2018-03-22 NOTE — Op Note (Signed)
Hernia, Open, Procedure Note  Indications: This is a 78 year old male who was admitted to the hospital on 01/28/18 with findings concerning for acute cholecystitis. On 11/18 he underwent laparoscopic cholecystectomy with intraoperative cholangiogram. The cholangiogram showed filling defects in the distal common bile duct. He underwent ERCP on 11/19 by Dr. Arta Silence. He was discharged home on 11/20. He has been doing well. He did have one episode of diarrhea this morning after eating sausage and pancakes for breakfast. Other than that, he has not had any issues with diarrhea. His abdominal pain is essentially resolved.  On the CT scan from the emergency department he was noted to have a left inguinal hernia. This was also seen laparoscopically. There is no incarcerated small bowel within the hernia.  The patient had a right inguinal hernia repaired when he was a baby. He also had a right orchiectomy for strangulation. He states that the left side has always bulged more than the right. It has become larger over the last few years. There are minimal symptoms associated with this.   Pre-operative Diagnosis: left reducible inguinal hernia Post-operative Diagnosis: same  Surgeon: Maia Petties   Assistants: none  Anesthesia: General LMA and TAP black  ASA Class: 2  Procedure Details  The patient was seen again in the Holding Room. The risks, benefits, complications, treatment options, and expected outcomes were discussed with the patient. The possibilities of reaction to medication, pulmonary aspiration, perforation of viscus, bleeding, recurrent infection, the need for additional procedures, and development of a complication requiring transfusion or further operation were discussed with the patient and/or family. The likelihood of success in repairing the hernia and returning the patient to their previous functional status is good.  There was concurrence with the proposed plan,  and informed consent was obtained. The site of surgery was properly noted/marked. The patient was taken to the Operating Room, identified as Garris Shellhammer, and the procedure verified as left inguinal hernia repair. A Time Out was held and the above information confirmed.  The patient was placed in the supine position and underwent induction of anesthesia. The lower abdomen and groin was prepped with Chloraprep and draped in the standard fashion, and 0.25% Marcaine with epinephrine was used to anesthetize the skin over the mid-portion of the inguinal canal. An oblique incision was made. Dissection was carried down through the subcutaneous tissue with cautery to the external oblique fascia.  We opened the external oblique fascia along the direction of its fibers to the external ring.  The spermatic cord was circumferentially dissected bluntly and retracted with a Penrose drain.  The ilioinguinal nerve was identified and preserved.  The floor of the inguinal canal was inspected and revealed a large direct defect.  We dissected the direct hernia sac free and reduced this into the preperitoneal space.  The floor of inguinal canal was closed with 0 Vicryl.  We skeletonized the spermatic cord and reduced a small cord lipoma.  We used a left Progrip mesh which was inserted and deployed across the floor of the inguinal canal. The mesh was tucked underneath the external oblique fascia laterally.  The flap of the mesh was closed around the spermatic cord to recreate the internal inguinal ring.  The mesh was secured to the pubic tubercle with 0 Vicryl.  Additional stay sutures were placed in the lower edge of the mesh and in the mesh flap.  The external oblique fascia was reapproximated with 2-0 Vicryl.  3-0 Vicryl was used to close the  subcutaneous tissues and 4-0 Monocryl was used to close the skin in subcuticular fashion.  Benzoin and steri-strips were used to seal the incision.  A clean dressing was applied.  The  patient was then extubated and brought to the recovery room in stable condition.  All sponge, instrument, and needle counts were correct prior to closure and at the conclusion of the case.   Estimated Blood Loss: Minimal                 Complications: None; patient tolerated the procedure well.         Disposition: PACU - hemodynamically stable.         Condition: stable  Imogene Burn. Georgette Dover, MD, [facility]  General/ Trauma Surgery Beeper [phone]  03/22/2018 10:17 AM

## 2018-03-22 NOTE — Transfer of Care (Signed)
Immediate Anesthesia Transfer of Care Note  Patient: William Black  Procedure(s) Performed: LEFT INGUINAL HERNIA REPAIR ERAS PATHWAY (Left Groin) INSERTION OF MESH (Left Groin)  Patient Location: PACU  Anesthesia Type:General  Level of Consciousness: sedated  Airway & Oxygen Therapy: Patient Spontanous Breathing and Patient connected to face mask oxygen  Post-op Assessment: Report given to RN and Post -op Vital signs reviewed and stable  Post vital signs: Reviewed and stable  Last Vitals:  Vitals Value Taken Time  BP 108/64 03/22/2018 10:15 AM  Temp    Pulse 59 03/22/2018 10:18 AM  Resp 11 03/22/2018 10:18 AM  SpO2 98 % 03/22/2018 10:18 AM  Vitals shown include unvalidated device data.  Last Pain:  Vitals:   03/22/18 0857  TempSrc:   PainSc: 0-No pain      Patients Stated Pain Goal: 3 (12/19/10 1975)  Complications: No apparent anesthesia complications

## 2018-03-22 NOTE — Anesthesia Preprocedure Evaluation (Addendum)
Anesthesia Evaluation  Patient identified by MRN, date of birth, ID band Patient awake    Reviewed: Allergy & Precautions, NPO status , Patient's Chart, lab work & pertinent test results  Airway Mallampati: III  TM Distance: >3 FB Neck ROM: Full    Dental no notable dental hx. (+) Edentulous Upper, Edentulous Lower   Pulmonary neg pulmonary ROS, Current Smoker,    Pulmonary exam normal breath sounds clear to auscultation       Cardiovascular hypertension, Pt. on medications negative cardio ROS Normal cardiovascular exam Rhythm:Irregular Rate:Normal     Neuro/Psych negative neurological ROS  negative psych ROS   GI/Hepatic negative GI ROS, Neg liver ROS,   Endo/Other  negative endocrine ROS  Renal/GU negative Renal ROS  negative genitourinary   Musculoskeletal negative musculoskeletal ROS (+)   Abdominal   Peds  Hematology negative hematology ROS (+)   Anesthesia Other Findings   Reproductive/Obstetrics                            Anesthesia Physical Anesthesia Plan  ASA: III  Anesthesia Plan: General and Regional   Post-op Pain Management:  Regional for Post-op pain   Induction: Intravenous  PONV Risk Score and Plan: 1 and Treatment may vary due to age or medical condition, Ondansetron and Dexamethasone  Airway Management Planned: Oral ETT  Additional Equipment:   Intra-op Plan:   Post-operative Plan: Extubation in OR  Informed Consent: I have reviewed the patients History and Physical, chart, labs and discussed the procedure including the risks, benefits and alternatives for the proposed anesthesia with the patient or authorized representative who has indicated his/her understanding and acceptance.   Dental advisory given  Plan Discussed with: CRNA  Anesthesia Plan Comments:         Anesthesia Quick Evaluation

## 2018-03-22 NOTE — Discharge Instructions (Signed)
[facility]   INGUINAL HERNIA REPAIR: POST OP INSTRUCTIONS  Always review your discharge instruction sheet given to you by the facility where your surgery was performed. IF YOU HAVE DISABILITY OR FAMILY LEAVE FORMS, YOU MUST BRING THEM TO THE OFFICE FOR PROCESSING.   DO NOT GIVE THEM TO YOUR DOCTOR.  1. A  prescription for pain medication may be given to you upon discharge.  Take your pain medication as prescribed, if needed.  If narcotic pain medicine is not needed, then you may take acetaminophen (Tylenol) or ibuprofen (Advil) as needed. 2. Take your usually prescribed medications unless otherwise directed. If you need a refill on your pain medication, please contact your pharmacy.  They will contact our office to request authorization. Prescriptions will not be filled after 5 pm or on week-ends. 3. You should follow a light diet the first 24 hours after arrival home, such as soup and crackers, etc.  Be sure to include lots of fluids daily.  Resume your normal diet the day after surgery. 4.Most patients will experience some swelling and bruising around the umbilicus or in the groin and scrotum.  Ice packs and reclining will help.  Swelling and bruising can take several days to resolve.  6. It is common to experience some constipation if taking pain medication after surgery.  Increasing fluid intake and taking a stool softener (such as Colace) will usually help or prevent this problem from occurring.  A mild laxative (Milk of Magnesia or Miralax) should be taken according to package directions if there are no bowel movements after 48 hours. 7. Unless discharge instructions indicate otherwise, you may remove your bandages 24-48 hours after surgery, and you may shower at that time.  You may have steri-strips (small skin tapes) in place directly over the incision.  These strips should be left on the skin for 7-10 days.  If your surgeon used skin glue on the incision, you may  shower in 24 hours.  The glue will flake off over the next 2-3 weeks.  Any sutures or staples will be removed at the office during your follow-up visit. 8. ACTIVITIES:  You may resume regular (light) daily activities beginning the next day--such as daily self-care, walking, climbing stairs--gradually increasing activities as tolerated.  You may have sexual intercourse when it is comfortable.  Refrain from any heavy lifting or straining until approved by your doctor.  a.You may drive when you are no longer taking prescription pain medication, you can comfortably wear a seatbelt, and you can safely maneuver your car and apply brakes. b.RETURN TO WORK:   _____________________________________________  9.You should see your doctor in the office for a follow-up appointment approximately 2-3 weeks after your surgery.  Make sure that you call for this appointment within a day or two after you arrive home to insure a convenient appointment time. 10.OTHER INSTRUCTIONS: _________________________    _____________________________________  WHEN TO CALL YOUR DOCTOR: 1. Fever over 101.0 2. Inability to urinate 3. Nausea and/or vomiting 4. Extreme swelling or bruising 5. Continued bleeding from incision. 6. Increased pain, redness, or drainage from the incision  The clinic staff is available to answer your questions during regular business hours.  Please dont hesitate to call and ask to speak to one of the nurses for clinical concerns.  If you have a medical emergency, go to the nearest emergency room or call 911.  A surgeon from [facility] is always on call at the hospital   [address] [place], [place], [place]  [address] ?  [address], [place], [place]   [address] [phone] ? [phone] ? FAX [phone] Web site: [email or web address]       Post Anesthesia Home Care Instructions  Activity: Get plenty of rest for the remainder of the day. A responsible  individual must stay with you for 24 hours following the procedure.  For the next 24 hours, DO NOT: -Drive a car -Paediatric nurse -Drink alcoholic beverages -Take any medication unless instructed by your physician -Make any legal decisions or sign important papers.  Meals: Start with liquid foods such as gelatin or soup. Progress to regular foods as tolerated. Avoid greasy, spicy, heavy foods. If nausea and/or vomiting occur, drink only clear liquids until the nausea and/or vomiting subsides. Call your physician if vomiting continues.  Special Instructions/Symptoms: Your throat may feel dry or sore from the anesthesia or the breathing tube placed in your throat during surgery. If this causes discomfort, gargle with warm salt water. The discomfort should disappear within 24 hours.  If you had a scopolamine patch placed behind your ear for the management of post- operative nausea and/or vomiting:  1. The medication in the patch is effective for 72 hours, after which it should be removed.  Wrap patch in a tissue and discard in the trash. Wash hands thoroughly with soap and water. 2. You may remove the patch earlier than 72 hours if you experience unpleasant side effects which may include dry mouth, dizziness or visual disturbances. 3. Avoid touching the patch. Wash your hands with soap and water after contact with the patch.

## 2018-03-23 ENCOUNTER — Encounter (HOSPITAL_BASED_OUTPATIENT_CLINIC_OR_DEPARTMENT_OTHER): Payer: Self-pay | Admitting: Surgery

## 2018-04-16 DIAGNOSIS — L723 Sebaceous cyst: Secondary | ICD-10-CM | POA: Diagnosis not present

## 2018-06-25 IMAGING — CR DG HAND COMPLETE 3+V*R*
3 series · 3 of 3 positions shown · non-contrast
Comparison: None.

CLINICAL DATA: Cat bite to the right hand 2 days ago. Patient
presents with erythema.

EXAM:
RIGHT HAND - COMPLETE 3+ VIEW

[hand pa]
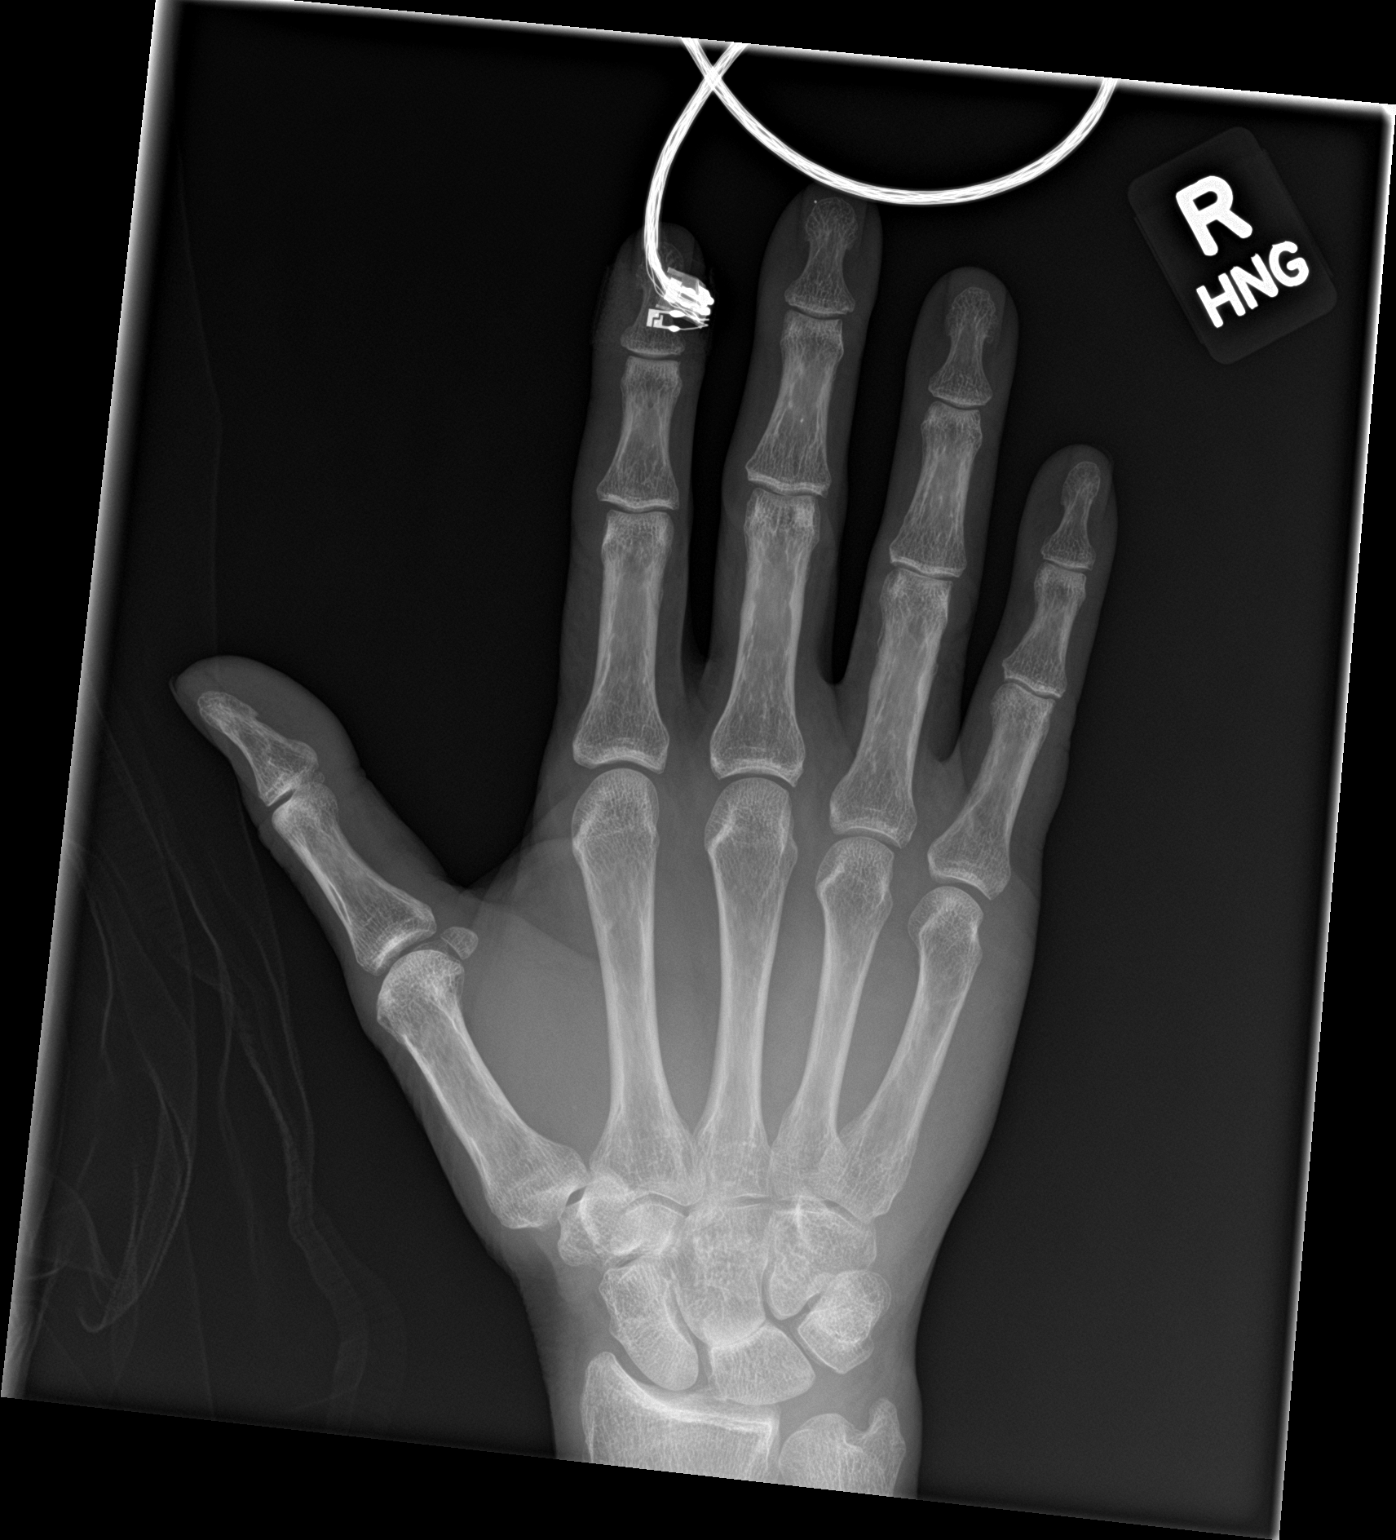

[hand obl]
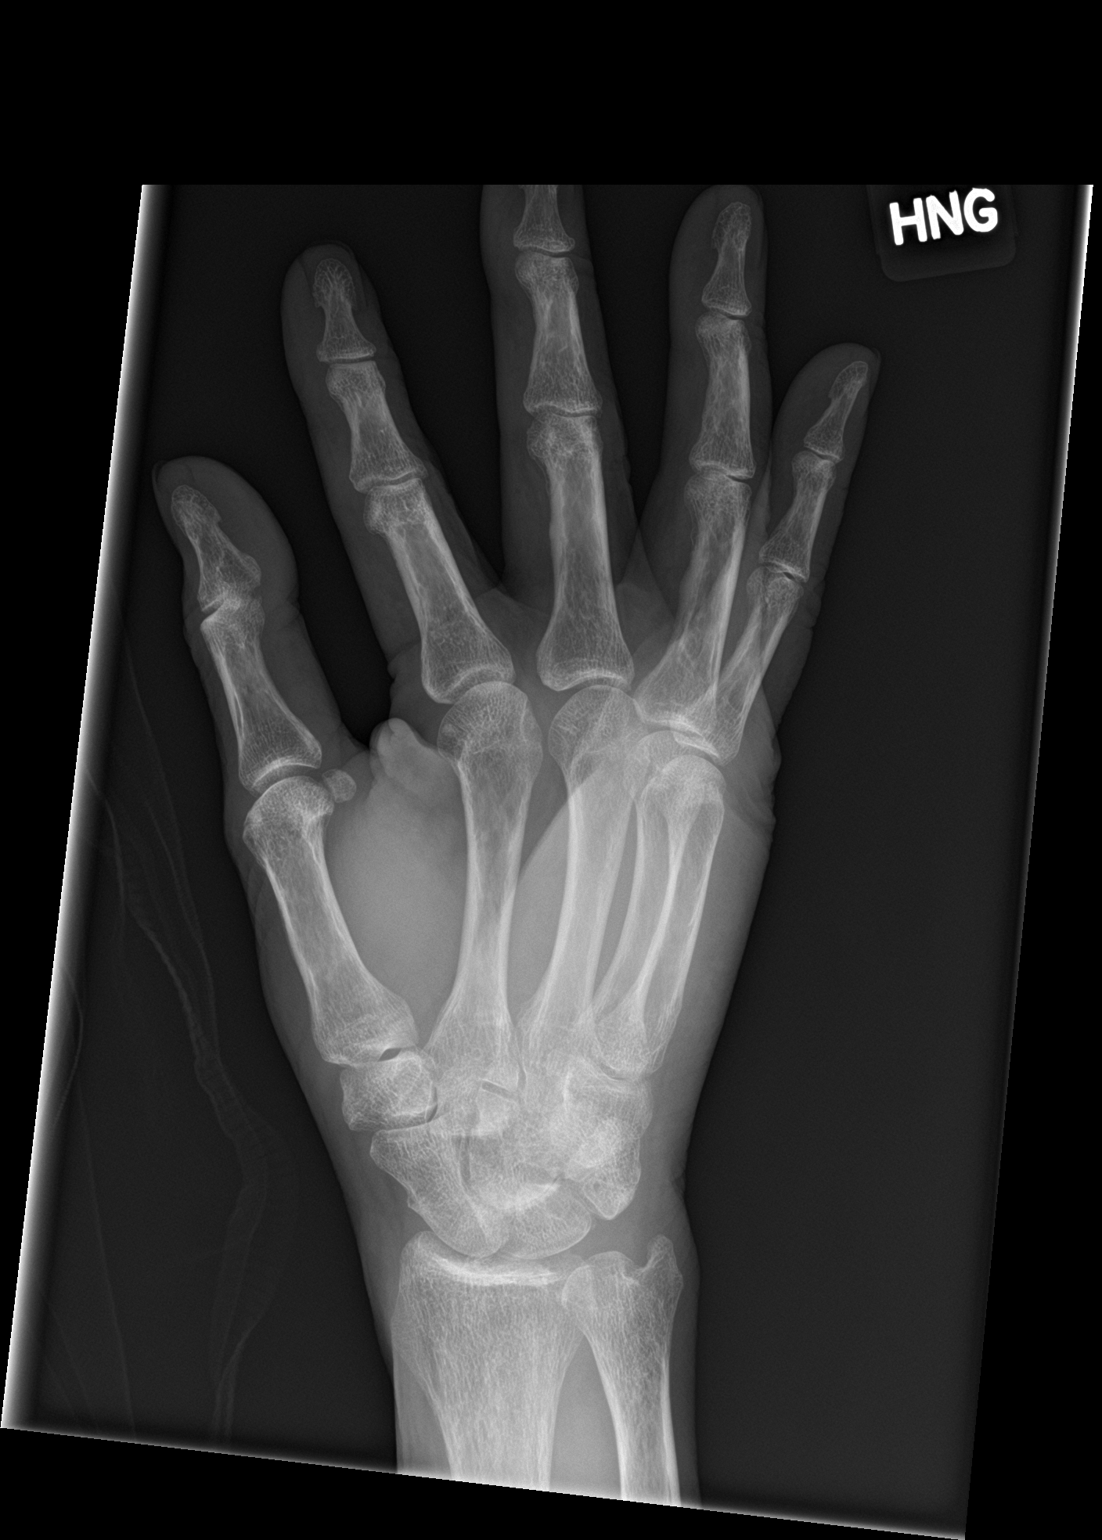

[hand lat]
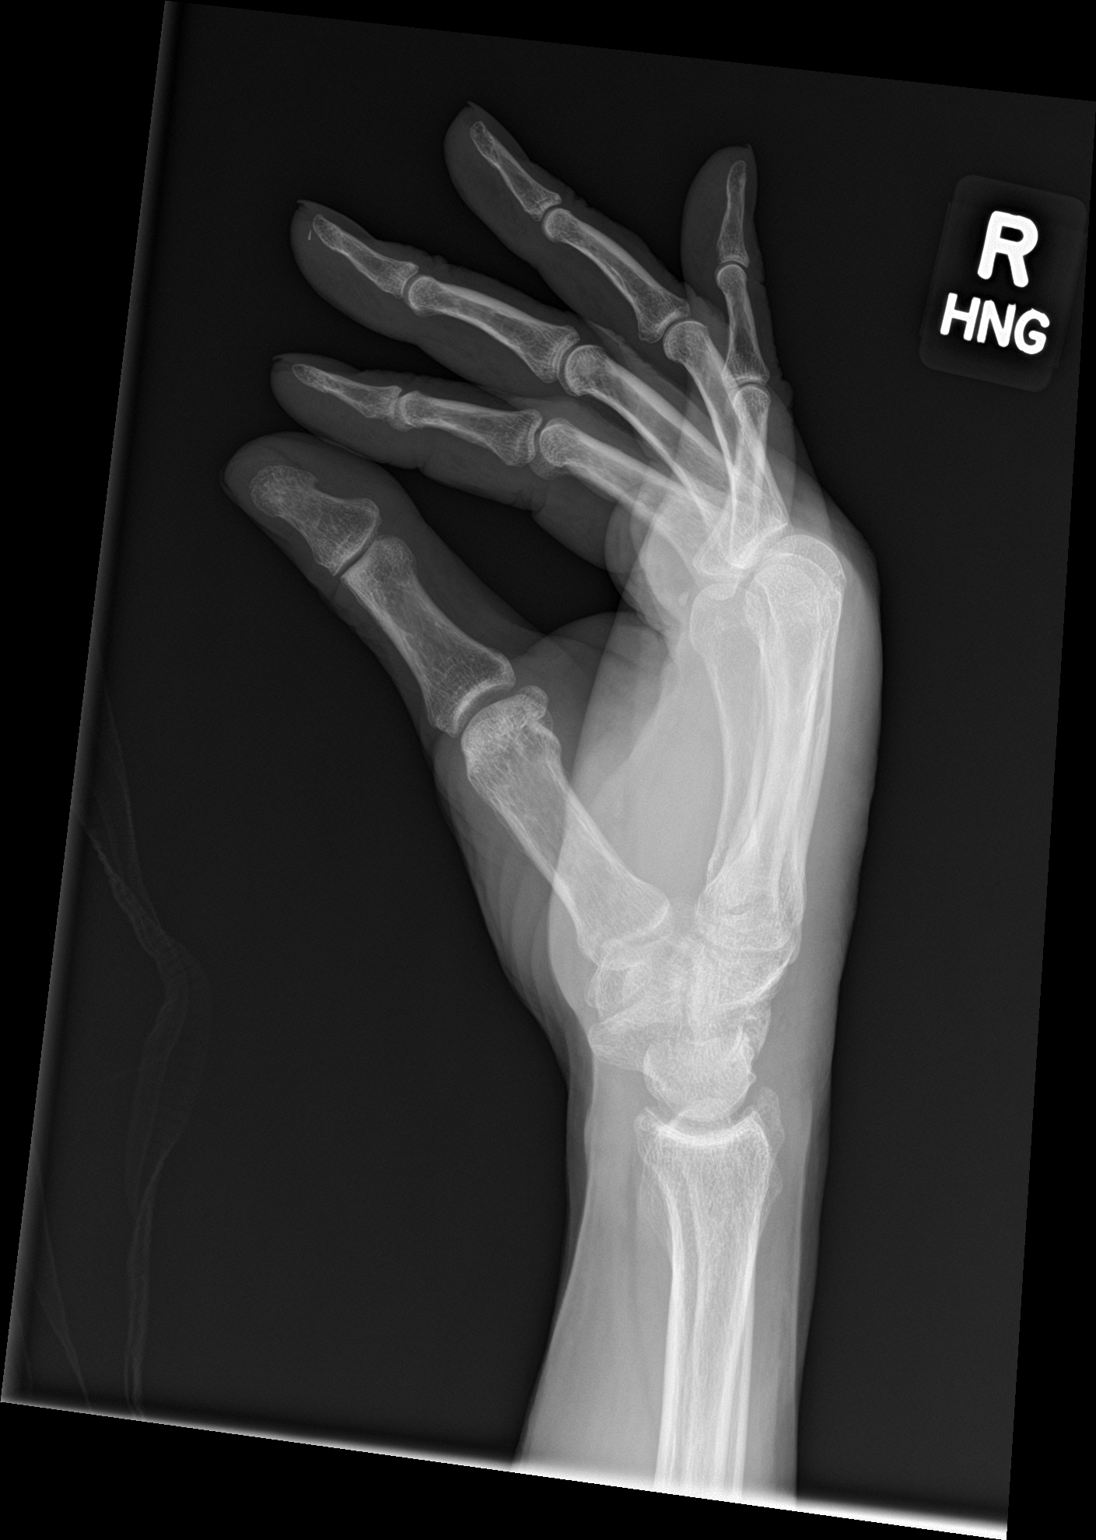

[3 of 3 positions shown; findings below may reference images not displayed]

FINDINGS: There is generalized mild diffuse soft tissue swelling of the hand
without underlying soft tissue emphysema. No frank bone destruction
or fracture. Punctate 2 mm radiopaque foreign body is seen at the
tip of the right right middle finger adjacent to the tuft along its
volar and radial aspect.
IMPRESSION: 1. 2 x 1 mm radiopaque foreign body adjacent to the tuft of the
right middle finger.
2. Generalized soft tissue edema of the hand and wrist without acute
osseous abnormality.

## 2018-10-03 DIAGNOSIS — Z0000 Encounter for general adult medical examination without abnormal findings: Secondary | ICD-10-CM | POA: Diagnosis not present

## 2018-10-03 DIAGNOSIS — Z7189 Other specified counseling: Secondary | ICD-10-CM | POA: Diagnosis not present

## 2018-10-03 DIAGNOSIS — E782 Mixed hyperlipidemia: Secondary | ICD-10-CM | POA: Diagnosis not present

## 2018-10-03 DIAGNOSIS — I10 Essential (primary) hypertension: Secondary | ICD-10-CM | POA: Diagnosis not present

## 2018-10-11 DIAGNOSIS — Z7189 Other specified counseling: Secondary | ICD-10-CM | POA: Diagnosis not present

## 2018-10-11 DIAGNOSIS — R9431 Abnormal electrocardiogram [ECG] [EKG]: Secondary | ICD-10-CM | POA: Diagnosis not present

## 2018-10-11 DIAGNOSIS — N200 Calculus of kidney: Secondary | ICD-10-CM | POA: Diagnosis not present

## 2018-10-11 DIAGNOSIS — I10 Essential (primary) hypertension: Secondary | ICD-10-CM | POA: Diagnosis not present

## 2018-10-11 DIAGNOSIS — N401 Enlarged prostate with lower urinary tract symptoms: Secondary | ICD-10-CM | POA: Diagnosis not present

## 2018-10-11 DIAGNOSIS — Z0000 Encounter for general adult medical examination without abnormal findings: Secondary | ICD-10-CM | POA: Diagnosis not present

## 2018-10-11 DIAGNOSIS — R0602 Shortness of breath: Secondary | ICD-10-CM | POA: Diagnosis not present

## 2018-10-11 DIAGNOSIS — E782 Mixed hyperlipidemia: Secondary | ICD-10-CM | POA: Diagnosis not present

## 2018-10-30 ENCOUNTER — Encounter: Payer: Self-pay | Admitting: Cardiology

## 2018-10-30 DIAGNOSIS — I10 Essential (primary) hypertension: Secondary | ICD-10-CM | POA: Insufficient documentation

## 2018-10-30 DIAGNOSIS — E785 Hyperlipidemia, unspecified: Secondary | ICD-10-CM

## 2018-10-30 HISTORY — DX: Hyperlipidemia, unspecified: E78.5

## 2018-10-31 ENCOUNTER — Ambulatory Visit: Payer: Medicare HMO | Admitting: Cardiology

## 2018-10-31 ENCOUNTER — Encounter: Payer: Self-pay | Admitting: Cardiology

## 2018-10-31 ENCOUNTER — Other Ambulatory Visit: Payer: Self-pay

## 2018-10-31 VITALS — BP 157/76 | HR 51 | Ht 69.0 in | Wt 146.8 lb

## 2018-10-31 DIAGNOSIS — R69 Illness, unspecified: Secondary | ICD-10-CM | POA: Diagnosis not present

## 2018-10-31 DIAGNOSIS — I493 Ventricular premature depolarization: Secondary | ICD-10-CM

## 2018-10-31 DIAGNOSIS — I10 Essential (primary) hypertension: Secondary | ICD-10-CM | POA: Diagnosis not present

## 2018-10-31 DIAGNOSIS — E7800 Pure hypercholesterolemia, unspecified: Secondary | ICD-10-CM

## 2018-10-31 DIAGNOSIS — F17200 Nicotine dependence, unspecified, uncomplicated: Secondary | ICD-10-CM | POA: Diagnosis not present

## 2018-10-31 NOTE — Progress Notes (Signed)
Primary Physician:  [name], MD   Patient ID: William Black, male    DOB: 05-18-1940, 78 y.o.   MRN: [id number]  Subjective:    Chief Complaint  Patient presents with  . New Patient (Initial Visit)  . Hypertension  . Hyperlipidemia  . Abnormal ECG    HPI: William Black  is a 78 y.o. male  with hypertension, hyperlipidemia, on going tobacco use, referred to [place] for abnormal EKG.  He is asymptomatic. Denies any chest pain, dyspnea, palpitations, leg edema, symptoms of claudication or TIA. States that hypertension and hyperlipidemia are well controlled. States that he is frustrated due to being in the hospital several times earlier this year with gallbladder, cat bite, and hernia repair and was now found to have this issue.   He has not been as active over the last few months due to the pandemic, but previously was very active. He does smoke 0.5 pack per day. He has failed nicorette gum and patches. Only occasional alcohol use.   Underwent left inguinal hernia surgery in Jan 2020 without any complications.   Past Medical History:  Diagnosis Date  . Deafness in right ear   . High cholesterol   . Hyperlipemia 10/30/2018  . Hypertension   . Kidney stones   . Kidney stones     Past Surgical History:  Procedure Laterality Date  . APPENDECTOMY    . CHOLECYSTECTOMY N/A 01/29/2018   Procedure: LAPAROSCOPIC CHOLECYSTECTOMY WITH INTRAOPERATIVE CHOLANGIOGRAM;  Surgeon: Donnie Mesa, MD;  Location: [place];  Service: General;  Laterality: N/A;  . CYSTOSCOPY    . DENTAL SURGERY    . ERCP N/A 01/30/2018   Procedure: ENDOSCOPIC RETROGRADE CHOLANGIOPANCREATOGRAPHY (ERCP);  Surgeon: Arta Silence, MD;  Location: [facility] ENDOSCOPY;  Service: Endoscopy;  Laterality: N/A;  . INGUINAL HERNIA REPAIR Left 03/22/2018   Procedure: LEFT INGUINAL HERNIA REPAIR ERAS PATHWAY;  Surgeon: Donnie Mesa, MD;  Location: [place];  Service: General;  Laterality: Left;  .  INSERTION OF MESH Left 03/22/2018   Procedure: INSERTION OF MESH;  Surgeon: Donnie Mesa, MD;  Location: [place];  Service: General;  Laterality: Left;  . REMOVAL OF STONES  01/30/2018   Procedure: REMOVAL OF STONES;  Surgeon: Arta Silence, MD;  Location: [facility]COPY;  Service: Endoscopy;;  . SPHINCTEROTOMY  01/30/2018   Procedure: SPHINCTEROTOMY;  Surgeon: Arta Silence, MD;  Location: [facility] ENDOSCOPY;  Service: Endoscopy;;    Social History   Socioeconomic History  . Marital status: Married    Spouse name: Not on file  . Number of children: 1  . Years of education: Not on file  . Highest education level: Not on file  Occupational History  . Not on file  Social Needs  . Financial resource strain: Not on file  . Food insecurity    Worry: Not on file    Inability: Not on file  . Transportation needs    Medical: Not on file    Non-medical: Not on file  Tobacco Use  . Smoking status: Current Every Day Smoker    Packs/day: 0.50    Types: Cigarettes  . Smokeless tobacco: Never Used  . Tobacco comment: had cigarette at 4 am  Substance and Sexual Activity  . Alcohol use: Yes    Frequency: Never    Comment: 2  beers a month   . Drug use: No  . Sexual activity: Not on file  Lifestyle  . Physical activity    Days per week:  Not on file    Minutes per session: Not on file  . Stress: Not on file  Relationships  . Social connections    Talks on phone: Not on file    Gets together: Not on file    Attends religious service: Not on file    Active member of club or organization: Not on file    Attends meetings of clubs or organizations: Not on file    Relationship status: Not on file  . Intimate partner violence    Fear of current or ex partner: Not on file    Emotionally abused: Not on file    Physically abused: Not on file    Forced sexual activity: Not on file  Other Topics Concern  . Not on file  Social History Narrative  . Not on file    Review of  Systems  Constitution: Negative for decreased appetite, malaise/fatigue, weight gain and weight loss.  HENT: Positive for hearing loss (right ear).   Eyes: Negative for visual disturbance.  Cardiovascular: Negative for chest pain, claudication, dyspnea on exertion, leg swelling, orthopnea, palpitations and syncope.  Respiratory: Negative for hemoptysis and wheezing.   Endocrine: Negative for cold intolerance and heat intolerance.  Hematologic/Lymphatic: Does not bruise/bleed easily.  Skin: Negative for nail changes.  Musculoskeletal: Negative for muscle weakness and myalgias.  Gastrointestinal: Negative for abdominal pain, change in bowel habit, nausea and vomiting.  Neurological: Negative for difficulty with concentration, dizziness, focal weakness and headaches.  Psychiatric/Behavioral: Negative for altered mental status and suicidal ideas.  All other systems reviewed and are negative.     Objective:  Blood pressure (!) 157/76, pulse (!) 51, height 5' 9" (1.753 m), weight 146 lb 12.8 oz (66.6 kg), SpO2 98 %. Body mass index is 21.68 kg/m.    Physical Exam  Constitutional: He is oriented to person, place, and time. Vital signs are normal. He appears well-developed and well-nourished.  HENT:  Head: Normocephalic and atraumatic.  Neck: Normal range of motion.  Cardiovascular: Normal rate, regular rhythm, normal heart sounds and intact distal pulses. Frequent extrasystoles are present.  Pulmonary/Chest: Effort normal and breath sounds normal. No accessory muscle usage. No respiratory distress.  Abdominal: Soft. Bowel sounds are normal.  Musculoskeletal: Normal range of motion.  Neurological: He is alert and oriented to person, place, and time.  Skin: Skin is warm and dry.  Vitals reviewed.  Radiology: No results found.  Laboratory examination:   PCP labs 10/03/2018: Hgb 48.6, Plt 126, CBC otherwise normal. Creatinine 1.1, K 3.9, eGFR 69, CMP normal.   CMP Latest Ref Rng & Units  01/31/2018 01/30/2018 01/29/2018  Glucose 70 - 99 mg/dL 163(H) 142(H) 113(H)  BUN 8 - 23 mg/dL 9 9 9  Creatinine 0.61 - 1.24 mg/dL 0.96 0.85 0.99  Sodium 135 - 145 mmol/L 137 138 137  Potassium 3.5 - 5.1 mmol/L 3.8 3.8 3.7  Chloride 98 - 111 mmol/L 106 106 106  CO2 22 - 32 mmol/L 25 24 23  Calcium 8.9 - 10.3 mg/dL 8.5(L) 8.5(L) 8.9  Total Protein 6.5 - 8.1 g/dL 5.5(L) 5.5(L) 6.4(L)  Total Bilirubin 0.3 - 1.2 mg/dL 0.7 0.7 0.8  Alkaline Phos 38 - 126 U/L 46 45 55  AST 15 - 41 U/L 36 50(H) 28  ALT 0 - 44 U/L 45(H) 53(H) 28   CBC Latest Ref Rng & Units 01/31/2018 01/30/2018 01/29/2018  WBC 4.0 - 10.5 K/uL 14.6(H) 17.0(H) 15.6(H)  Hemoglobin 13.0 - 17.0 g/dL 14.0 14.1 16.2    Hematocrit 39.0 - 52.0 % 40.2 42.2 46.5  Platelets 150 - 400 K/uL 98(L) 107(L) 129(L)   Lipid Panel  No results found for: CHOL, TRIG, HDL, CHOLHDL, VLDL, LDLCALC, LDLDIRECT HEMOGLOBIN A1C No results found for: HGBA1C, MPG TSH No results for input(s): TSH in the last 8760 hours.  PRN Meds:. Medications Discontinued During This Encounter  Medication Reason  . HYDROcodone-acetaminophen (NORCO/VICODIN) 5-325 MG tablet    Current Meds  Medication Sig  . aspirin EC 81 MG tablet Take 81 mg by mouth daily.  . atorvastatin (LIPITOR) 20 MG tablet Take 20 mg by mouth at bedtime.   . diltiazem (CARDIZEM CD) 360 MG 24 hr capsule Take 360 mg by mouth daily.  . lisinopril (PRINIVIL,ZESTRIL) 40 MG tablet Take 40 mg by mouth 2 (two) times daily.    Cardiac Studies:   PCP echo 10/11/2018: Normal LVEF. Suggestive of diastolic dysfunction. Normal wall motion. Hypertrophy of right ventricle anterior wall. Mild mitral annular calcification with minimal MR. Mild TR.   Assessment:     ICD-10-CM   1. Frequent PVCs  I49.3 EKG 12-Lead    PCV CARDIAC STRESS TEST  2. Essential hypertension  I10   3. Pure hypercholesterolemia  E78.00   4. Tobacco use disorder  F17.200    EKG 10/31/2018: Normal sinus rhythm at 72 bpm, normal  axis, frequent PVC's, ST depression in inferior leads, cannot exclude ischemia. Abnormal EKG.   Recommendations:   Patient is here for evaluation of frequent PVCs.  He is asymptomatic in regards to his PVCs.  He does have multiple risk factors for CAD.  Although CT scan of the chest and CT of the abdomen in 2019 did not suggest any significant atherosclerosis or abdominal aortic aneurysm, feel that he should be further evaluated given his PVCs.  Will obtain treadmill stress testing to see if PVCs improve or worsen with exercise.  We will continue with current medications for now.  His blood pressure is elevated today, but generally well controlled.  Will reevaluate at his next office visit.  I reviewed echocardiogram from PCP office, has normal LVEF.  No clinical evidence of heart failure.  Despite his long history of smoking, has normal vascular exam. He is without symptoms of claudication. I have extensively discussed with the patient the importance of working to quit smoking.  He is failed nicotine patches and gum.  I have offered medication; however, he is not fully committed to quitting at this time.  Will reassess his next appointment.  He is on atorvastatin for hyperlipidemia and states that this is well controlled.  I will see him back after his stress test for follow-up.   *I have discussed this case with Dr. Ganji and he personally examined the patient and participated in formulating the plan.*   Ashton Haynes Kelley, MSN, APRN, FNP-C [facility] Office: [phone] Fax: [phone]  

## 2018-11-02 ENCOUNTER — Encounter: Payer: Self-pay | Admitting: Cardiology

## 2018-11-05 DIAGNOSIS — R351 Nocturia: Secondary | ICD-10-CM | POA: Diagnosis not present

## 2018-11-05 DIAGNOSIS — N401 Enlarged prostate with lower urinary tract symptoms: Secondary | ICD-10-CM | POA: Diagnosis not present

## 2018-11-05 DIAGNOSIS — N403 Nodular prostate with lower urinary tract symptoms: Secondary | ICD-10-CM | POA: Diagnosis not present

## 2018-11-08 DIAGNOSIS — R9431 Abnormal electrocardiogram [ECG] [EKG]: Secondary | ICD-10-CM | POA: Diagnosis not present

## 2018-11-08 DIAGNOSIS — Z23 Encounter for immunization: Secondary | ICD-10-CM | POA: Diagnosis not present

## 2018-11-08 DIAGNOSIS — Z7189 Other specified counseling: Secondary | ICD-10-CM | POA: Diagnosis not present

## 2018-12-10 ENCOUNTER — Ambulatory Visit (INDEPENDENT_AMBULATORY_CARE_PROVIDER_SITE_OTHER): Payer: Medicare HMO

## 2018-12-10 ENCOUNTER — Other Ambulatory Visit: Payer: Self-pay

## 2018-12-10 DIAGNOSIS — I493 Ventricular premature depolarization: Secondary | ICD-10-CM | POA: Diagnosis not present

## 2018-12-14 ENCOUNTER — Ambulatory Visit: Payer: Medicare HMO | Admitting: Cardiology

## 2018-12-18 ENCOUNTER — Other Ambulatory Visit: Payer: Self-pay

## 2018-12-18 ENCOUNTER — Encounter: Payer: Self-pay | Admitting: Cardiology

## 2018-12-18 ENCOUNTER — Ambulatory Visit: Payer: Medicare HMO | Admitting: Cardiology

## 2018-12-18 VITALS — BP 148/83 | HR 58 | Temp 98.2°F | Ht 69.0 in | Wt 142.0 lb

## 2018-12-18 DIAGNOSIS — I493 Ventricular premature depolarization: Secondary | ICD-10-CM

## 2018-12-18 DIAGNOSIS — F17200 Nicotine dependence, unspecified, uncomplicated: Secondary | ICD-10-CM

## 2018-12-18 DIAGNOSIS — R69 Illness, unspecified: Secondary | ICD-10-CM | POA: Diagnosis not present

## 2018-12-18 DIAGNOSIS — E7800 Pure hypercholesterolemia, unspecified: Secondary | ICD-10-CM

## 2018-12-18 DIAGNOSIS — I10 Essential (primary) hypertension: Secondary | ICD-10-CM | POA: Diagnosis not present

## 2018-12-18 NOTE — Progress Notes (Signed)
Primary Physician:  [name], MD   Patient ID: William Black, male    DOB: January 30, 1941, 78 y.o.   MRN: [id number]  Subjective:    Chief Complaint  Patient presents with   PVC's   Follow-up    4 weeks   Results    GXT    HPI: William Black  is a 78 y.o. male  with hypertension, hyperlipidemia, on going tobacco use, recently evaluated by [place] for frequent PVC's.   He underwent routine treadmill stress testing and now presents for follow up.   He is asymptomatic. Denies any chest pain, dyspnea, palpitations, leg edema, symptoms of claudication or TIA. States that hypertension and hyperlipidemia are well controlled. States that he is frustrated due to being in the hospital several times earlier this year with gallbladder, cat bite, and hernia repair and was now found to have this issue.   He has not been as active over the last few months due to the pandemic. He does smoke, but is working to quit and has been able to cut back to less than 1/2 pack per day. Only occasional alcohol use.   Underwent left inguinal hernia surgery in Jan 2020 without any complications.   Past Medical History:  Diagnosis Date   Deafness in right ear    High cholesterol    Hyperlipemia 10/30/2018   Hypertension    Kidney stones    Kidney stones     Past Surgical History:  Procedure Laterality Date   APPENDECTOMY     CHOLECYSTECTOMY N/A 01/29/2018   Procedure: LAPAROSCOPIC CHOLECYSTECTOMY WITH INTRAOPERATIVE CHOLANGIOGRAM;  Surgeon: Donnie Mesa, MD;  Location: [place];  Service: General;  Laterality: N/A;   CYSTOSCOPY     DENTAL SURGERY     ERCP N/A 01/30/2018   Procedure: ENDOSCOPIC RETROGRADE CHOLANGIOPANCREATOGRAPHY (ERCP);  Surgeon: Arta Silence, MD;  Location: [facility] ENDOSCOPY;  Service: Endoscopy;  Laterality: N/A;   INGUINAL HERNIA REPAIR Left 03/22/2018   Procedure: LEFT INGUINAL HERNIA REPAIR ERAS PATHWAY;  Surgeon: Donnie Mesa, MD;  Location: [place];  Service: General;  Laterality: Left;   INSERTION OF MESH Left 03/22/2018   Procedure: INSERTION OF MESH;  Surgeon: Donnie Mesa, MD;  Location: [place];  Service: General;  Laterality: Left;   REMOVAL OF STONES  01/30/2018   Procedure: REMOVAL OF STONES;  Surgeon: Arta Silence, MD;  Location: [facility] ENDOSCOPY;  Service: Endoscopy;;   SPHINCTEROTOMY  01/30/2018   Procedure: Joan Mayans;  Surgeon: Arta Silence, MD;  Location: [facility] ENDOSCOPY;  Service: Endoscopy;;    Social History   Socioeconomic History   Marital status: Married    Spouse name: Not on file   Number of children: 1   Years of education: Not on file   Highest education level: Not on file  Occupational History   Not on file  Social Needs   Financial resource strain: Not on file   Food insecurity    Worry: Not on file    Inability: Not on file   Transportation needs    Medical: Not on file    Non-medical: Not on file  Tobacco Use   Smoking status: Current Every Day Smoker    Packs/day: 0.50    Types: Cigarettes   Smokeless tobacco: Never Used   Tobacco comment: had cigarette at 4 am  Substance and Sexual Activity   Alcohol use: Yes    Frequency: Never    Comment: occ   Drug use: No   Sexual  activity: Not on file  Lifestyle   Physical activity    Days per week: Not on file    Minutes per session: Not on file   Stress: Not on file  Relationships   Social connections    Talks on phone: Not on file    Gets together: Not on file    Attends religious service: Not on file    Active member of club or organization: Not on file    Attends meetings of clubs or organizations: Not on file    Relationship status: Not on file   Intimate partner violence    Fear of current or ex partner: Not on file    Emotionally abused: Not on file    Physically abused: Not on file    Forced sexual activity: Not on file  Other Topics Concern   Not on file  Social  History Narrative   Not on file    Review of Systems  Constitution: Negative for decreased appetite, malaise/fatigue, weight gain and weight loss.  HENT: Positive for hearing loss (right ear).   Eyes: Negative for visual disturbance.  Cardiovascular: Negative for chest pain, claudication, dyspnea on exertion, leg swelling, orthopnea, palpitations and syncope.  Respiratory: Negative for hemoptysis and wheezing.   Endocrine: Negative for cold intolerance and heat intolerance.  Hematologic/Lymphatic: Does not bruise/bleed easily.  Skin: Negative for nail changes.  Musculoskeletal: Negative for muscle weakness and myalgias.  Gastrointestinal: Negative for abdominal pain, change in bowel habit, nausea and vomiting.  Neurological: Negative for difficulty with concentration, dizziness, focal weakness and headaches.  Psychiatric/Behavioral: Negative for altered mental status and suicidal ideas.  All other systems reviewed and are negative.     Objective:  Blood pressure (!) 148/83, pulse (!) 58, temperature 98.2 F (36.8 C), height '5\' 9"'  (1.753 m), weight 142 lb (64.4 kg), SpO2 96 %. Body mass index is 20.97 kg/m.    Physical Exam  Constitutional: He is oriented to person, place, and time. Vital signs are normal. He appears well-developed and well-nourished.  HENT:  Head: Normocephalic and atraumatic.  Neck: Normal range of motion.  Cardiovascular: Normal rate, regular rhythm, normal heart sounds and intact distal pulses. Frequent extrasystoles are present.  Pulmonary/Chest: Effort normal and breath sounds normal. No accessory muscle usage. No respiratory distress.  Abdominal: Soft. Bowel sounds are normal.  Musculoskeletal: Normal range of motion.  Neurological: He is alert and oriented to person, place, and time.  Skin: Skin is warm and dry.  Vitals reviewed.  Radiology: No results found.  Laboratory examination:   PCP labs 10/03/2018: Hgb 48.6, Plt 126, CBC otherwise normal.  Creatinine 1.1, K 3.9, eGFR 69, CMP normal.   CMP Latest Ref Rng & Units 01/31/2018 01/30/2018 01/29/2018  Glucose 70 - 99 mg/dL 163(H) 142(H) 113(H)  BUN 8 - 23 mg/dL '9 9 9  ' Creatinine 0.61 - 1.24 mg/dL 0.96 0.85 0.99  Sodium 135 - 145 mmol/L 137 138 137  Potassium 3.5 - 5.1 mmol/L 3.8 3.8 3.7  Chloride 98 - 111 mmol/L 106 106 106  CO2 22 - 32 mmol/L '25 24 23  ' Calcium 8.9 - 10.3 mg/dL 8.5(L) 8.5(L) 8.9  Total Protein 6.5 - 8.1 g/dL 5.5(L) 5.5(L) 6.4(L)  Total Bilirubin 0.3 - 1.2 mg/dL 0.7 0.7 0.8  Alkaline Phos 38 - 126 U/L 46 45 55  AST 15 - 41 U/L 36 50(H) 28  ALT 0 - 44 U/L 45(H) 53(H) 28   CBC Latest Ref Rng & Units 01/31/2018 01/30/2018 01/29/2018  WBC 4.0 - 10.5 K/uL 14.6(H) 17.0(H) 15.6(H)  Hemoglobin 13.0 - 17.0 g/dL 14.0 14.1 16.2  Hematocrit 39.0 - 52.0 % 40.2 42.2 46.5  Platelets 150 - 400 K/uL 98(L) 107(L) 129(L)   Lipid Panel  No results found for: CHOL, TRIG, HDL, CHOLHDL, VLDL, LDLCALC, LDLDIRECT HEMOGLOBIN A1C No results found for: HGBA1C, MPG TSH No results for input(s): TSH in the last 8760 hours.  PRN Meds:. There are no discontinued medications. Current Meds  Medication Sig   aspirin EC 81 MG tablet Take 81 mg by mouth daily.   atorvastatin (LIPITOR) 20 MG tablet Take 20 mg by mouth at bedtime.    diltiazem (CARDIZEM CD) 360 MG 24 hr capsule Take 360 mg by mouth daily.   lisinopril (PRINIVIL,ZESTRIL) 40 MG tablet Take 40 mg by mouth 2 (two) times daily.    Cardiac Studies:   Treadmill Exercise Stress 12/10/2018: Resting EKG demonstrates sinus rhythm with right atrial enlargement and frequent PACs and atrial couplets with aberrant conduction and nonspecific ST abnormality.  Stress arrhythmias includes occasional PVCs and frequent PACs with aberrant conduction. Normal blood pressure response.  Stress terminated due to leg pain suggestive of claudication. Stress EKG was uninterpretable for ischemia due to frequent PACs, patient exercised for 3:16 min  on Bruce protocol; achieved 4.94 METs at 97% of maximum predicted heart rate.  Chest pain is not present.   Clinical correlation is recommended.  Consider pharmacologic stress test if clinically indicated.  PCP echo 10/11/2018: Normal LVEF. Suggestive of diastolic dysfunction. Normal wall motion. Hypertrophy of right ventricle anterior wall. Mild mitral annular calcification with minimal MR. Mild TR.   Assessment:     ICD-10-CM   1. Frequent PVCs  I49.3 PCV MYOCARDIAL PERFUSION WITH LEXISCAN  2. Essential hypertension  I10   3. Pure hypercholesterolemia  E78.00   4. Tobacco use disorder  F17.200    EKG 10/31/2018: Normal sinus rhythm at 72 bpm, normal axis, frequent PVC's, ST depression in inferior leads, cannot exclude ischemia. Abnormal EKG.   Recommendations:   I discussed routine treadmill stress test results with the patient, he had frequent PACs and PVCs on stress test and will exercise passively.  In view of his risk factors, stress test findings and his frequent PVCs, I recommended further evaluation with nuclear stress testing.  He states that he will be unable to walk on the treadmill due to knee pain and ankle pain that occurred on his previous stress test.  We will schedule for Lexiscan nuclear stress testing.  Blood pressure is well controlled, will continue with current medications.  He is asymptomatic in regards to his PVCs.  He reports hyperlipidemia is well controlled.  He has been working to quit smoking and has cut back to less than half a pack per day.  I congratulated him on this provided positive reinforcement.  I will see him back after his stress test for further recommendations and reevaluation.   Miquel Dunn, MSN, APRN, FNP-C [facility] [place] Office: [phone] Fax: [phone]

## 2019-01-07 ENCOUNTER — Other Ambulatory Visit: Payer: Self-pay

## 2019-01-07 DIAGNOSIS — Z20822 Contact with and (suspected) exposure to covid-19: Secondary | ICD-10-CM

## 2019-01-08 LAB — NOVEL CORONAVIRUS, NAA: SARS-CoV-2, NAA: NOT DETECTED

## 2019-01-14 ENCOUNTER — Ambulatory Visit: Payer: Medicare HMO

## 2019-01-21 ENCOUNTER — Ambulatory Visit: Payer: Medicare HMO | Admitting: Cardiology

## 2019-01-21 ENCOUNTER — Ambulatory Visit (INDEPENDENT_AMBULATORY_CARE_PROVIDER_SITE_OTHER): Payer: Medicare HMO

## 2019-01-21 ENCOUNTER — Other Ambulatory Visit: Payer: Self-pay

## 2019-01-21 DIAGNOSIS — I493 Ventricular premature depolarization: Secondary | ICD-10-CM

## 2019-01-21 NOTE — Progress Notes (Deleted)
Primary Physician:  [name], MD   Patient ID: William Black, male    DOB: 06/02/1940, 78 y.o.   MRN: [id number]  Subjective:    No chief complaint on file.   HPI: William Black  is a 78 y.o. male  with hypertension, hyperlipidemia, on going tobacco use, recently evaluated by [place] for frequent PVC's.   He underwent routine treadmill stress testing and now presents for follow up.   He is asymptomatic. Denies any chest pain, dyspnea, palpitations, leg edema, symptoms of claudication or TIA. States that hypertension and hyperlipidemia are well controlled. States that he is frustrated due to being in the hospital several times earlier this year with gallbladder, cat bite, and hernia repair and was now found to have this issue.   He has not been as active over the last few months due to the pandemic. He does smoke, but is working to quit and has been able to cut back to less than 1/2 pack per day. Only occasional alcohol use.   Underwent left inguinal hernia surgery in Jan 2020 without any complications.   Past Medical History:  Diagnosis Date  . Deafness in right ear   . High cholesterol   . Hyperlipemia 10/30/2018  . Hypertension   . Kidney stones   . Kidney stones     Past Surgical History:  Procedure Laterality Date  . APPENDECTOMY    . CHOLECYSTECTOMY N/A 01/29/2018   Procedure: LAPAROSCOPIC CHOLECYSTECTOMY WITH INTRAOPERATIVE CHOLANGIOGRAM;  Surgeon: Donnie Mesa, MD;  Location: [place];  Service: General;  Laterality: N/A;  . CYSTOSCOPY    . DENTAL SURGERY    . ERCP N/A 01/30/2018   Procedure: ENDOSCOPIC RETROGRADE CHOLANGIOPANCREATOGRAPHY (ERCP);  Surgeon: Arta Silence, MD;  Location: [facility] ENDOSCOPY;  Service: Endoscopy;  Laterality: N/A;  . INGUINAL HERNIA REPAIR Left 03/22/2018   Procedure: LEFT INGUINAL HERNIA REPAIR ERAS PATHWAY;  Surgeon: Donnie Mesa, MD;  Location: [place];  Service: General;  Laterality: Left;  . INSERTION OF MESH  Left 03/22/2018   Procedure: INSERTION OF MESH;  Surgeon: Donnie Mesa, MD;  Location: [place];  Service: General;  Laterality: Left;  . REMOVAL OF STONES  01/30/2018   Procedure: REMOVAL OF STONES;  Surgeon: Arta Silence, MD;  Location: [facility]COPY;  Service: Endoscopy;;  . SPHINCTEROTOMY  01/30/2018   Procedure: SPHINCTEROTOMY;  Surgeon: Arta Silence, MD;  Location: [place] ENDOSCOPY;  Service: Endoscopy;;    Social History   Socioeconomic History  . Marital status: Married    Spouse name: Not on file  . Number of children: 1  . Years of education: Not on file  . Highest education level: Not on file  Occupational History  . Not on file  Social Needs  . Financial resource strain: Not on file  . Food insecurity    Worry: Not on file    Inability: Not on file  . Transportation needs    Medical: Not on file    Non-medical: Not on file  Tobacco Use  . Smoking status: Current Every Day Smoker    Packs/day: 0.50    Types: Cigarettes  . Smokeless tobacco: Never Used  . Tobacco comment: had cigarette at 4 am  Substance and Sexual Activity  . Alcohol use: Yes    Frequency: Never    Comment: occ  . Drug use: No  . Sexual activity: Not on file  Lifestyle  . Physical activity    Days per week: Not on file  Minutes per session: Not on file  . Stress: Not on file  Relationships  . Social Herbalist on phone: Not on file    Gets together: Not on file    Attends religious service: Not on file    Active member of club or organization: Not on file    Attends meetings of clubs or organizations: Not on file    Relationship status: Not on file  . Intimate partner violence    Fear of current or ex partner: Not on file    Emotionally abused: Not on file    Physically abused: Not on file    Forced sexual activity: Not on file  Other Topics Concern  . Not on file  Social History Narrative  . Not on file    Review of Systems  Constitution:  Negative for decreased appetite, malaise/fatigue, weight gain and weight loss.  HENT: Positive for hearing loss (right ear).   Eyes: Negative for visual disturbance.  Cardiovascular: Negative for chest pain, claudication, dyspnea on exertion, leg swelling, orthopnea, palpitations and syncope.  Respiratory: Negative for hemoptysis and wheezing.   Endocrine: Negative for cold intolerance and heat intolerance.  Hematologic/Lymphatic: Does not bruise/bleed easily.  Skin: Negative for nail changes.  Musculoskeletal: Negative for muscle weakness and myalgias.  Gastrointestinal: Negative for abdominal pain, change in bowel habit, nausea and vomiting.  Neurological: Negative for difficulty with concentration, dizziness, focal weakness and headaches.  Psychiatric/Behavioral: Negative for altered mental status and suicidal ideas.  All other systems reviewed and are negative.     Objective:  There were no vitals taken for this visit. There is no height or weight on file to calculate BMI.    Physical Exam  Constitutional: He is oriented to person, place, and time. Vital signs are normal. He appears well-developed and well-nourished.  HENT:  Head: Normocephalic and atraumatic.  Neck: Normal range of motion.  Cardiovascular: Normal rate, regular rhythm, normal heart sounds and intact distal pulses. Frequent extrasystoles are present.  Pulmonary/Chest: Effort normal and breath sounds normal. No accessory muscle usage. No respiratory distress.  Abdominal: Soft. Bowel sounds are normal.  Musculoskeletal: Normal range of motion.  Neurological: He is alert and oriented to person, place, and time.  Skin: Skin is warm and dry.  Vitals reviewed.  Radiology: No results found.  Laboratory examination:   PCP labs 10/03/2018: Hgb 48.6, Plt 126, CBC otherwise normal. Creatinine 1.1, K 3.9, eGFR 69, CMP normal.   CMP Latest Ref Rng & Units 01/31/2018 01/30/2018 01/29/2018  Glucose 70 - 99 mg/dL 163(H)  142(H) 113(H)  BUN 8 - 23 mg/dL _0 Creatinine 0.61 - 1.24 mg/dL 0.96 0.85 0.99  Sodium 135 - 145 mmol/L 137 138 137  Potassium 3.5 - 5.1 mmol/L 3.8 3.8 3.7  Chloride 98 - 111 mmol/L 106 106 106  CO2 22 - 32 mmol/L _1 Calcium 8.9 - 10.3 mg/dL 8.5(L) 8.5(L) 8.9  Total Protein 6.5 - 8.1 g/dL 5.5(L) 5.5(L) 6.4(L)  Total Bilirubin 0.3 - 1.2 mg/dL 0.7 0.7 0.8  Alkaline Phos 38 - 126 U/L 46 45 55  AST 15 - 41 U/L 36 50(H) 28  ALT 0 - 44 U/L 45(H) 53(H) 28   CBC Latest Ref Rng & Units 01/31/2018 01/30/2018 01/29/2018  WBC 4.0 - 10.5 K/uL 14.6(H) 17.0(H) 15.6(H)  Hemoglobin 13.0 - 17.0 g/dL 14.0 14.1 16.2  Hematocrit 39.0 - 52.0 % 40.2 42.2 46.5  Platelets 150 - 400 K/uL 98(L)  107(L) 129(L)   Lipid Panel  No results found for: CHOL, TRIG, HDL, CHOLHDL, VLDL, LDLCALC, LDLDIRECT HEMOGLOBIN A1C No results found for: HGBA1C, MPG TSH No results for input(s): TSH in the last 8760 hours.  PRN Meds:. There are no discontinued medications. No outpatient medications have been marked as taking for the 01/21/19 encounter (Appointment) with Miquel Dunn, NP.    Cardiac Studies:   Treadmill Exercise Stress 12/10/2018: Resting EKG demonstrates sinus rhythm with right atrial enlargement and frequent PACs and atrial couplets with aberrant conduction and nonspecific ST abnormality.  Stress arrhythmias includes occasional PVCs and frequent PACs with aberrant conduction. Normal blood pressure response.  Stress terminated due to leg pain suggestive of claudication. Stress EKG was uninterpretable for ischemia due to frequent PACs, patient exercised for 3:16 min on Bruce protocol; achieved 4.94 METs at 97% of maximum predicted heart rate.  Chest pain is not present.   Clinical correlation is recommended.  Consider pharmacologic stress test if clinically indicated.  PCP echo 10/11/2018: Normal LVEF. Suggestive of diastolic dysfunction. Normal wall motion. Hypertrophy of right ventricle  anterior wall. Mild mitral annular calcification with minimal MR. Mild TR.   Assessment:   No diagnosis found. EKG 10/31/2018: Normal sinus rhythm at 72 bpm, normal axis, frequent PVC's, ST depression in inferior leads, cannot exclude ischemia. Abnormal EKG.   Recommendations:   I discussed routine treadmill stress test results with the patient, he had frequent PACs and PVCs on stress test and will exercise passively.  In view of his risk factors, stress test findings and his frequent PVCs, I recommended further evaluation with nuclear stress testing.  He states that he will be unable to walk on the treadmill due to knee pain and ankle pain that occurred on his previous stress test.  We will schedule for Lexiscan nuclear stress testing.  Blood pressure is well controlled, will continue with current medications.  He is asymptomatic in regards to his PVCs.  He reports hyperlipidemia is well controlled.  He has been working to quit smoking and has cut back to less than half a pack per day.  I congratulated him on this provided positive reinforcement.  I will see him back after his stress test for further recommendations and reevaluation.   Miquel Dunn, MSN, APRN, FNP-C [facility]. [place] Office: [phone] Fax: [phone]

## 2019-01-29 ENCOUNTER — Other Ambulatory Visit: Payer: Self-pay

## 2019-01-29 ENCOUNTER — Ambulatory Visit: Payer: Medicare HMO | Admitting: Cardiology

## 2019-01-29 ENCOUNTER — Ambulatory Visit: Payer: Medicare HMO

## 2019-01-29 ENCOUNTER — Encounter: Payer: Self-pay | Admitting: Cardiology

## 2019-01-29 VITALS — BP 139/62 | HR 46 | Temp 98.3°F | Ht 69.0 in | Wt 147.0 lb

## 2019-01-29 DIAGNOSIS — I493 Ventricular premature depolarization: Secondary | ICD-10-CM

## 2019-01-29 DIAGNOSIS — I10 Essential (primary) hypertension: Secondary | ICD-10-CM | POA: Diagnosis not present

## 2019-01-29 DIAGNOSIS — F17200 Nicotine dependence, unspecified, uncomplicated: Secondary | ICD-10-CM | POA: Diagnosis not present

## 2019-01-29 DIAGNOSIS — R69 Illness, unspecified: Secondary | ICD-10-CM | POA: Diagnosis not present

## 2019-01-29 MED ORDER — METOPROLOL TARTRATE 25 MG PO TABS: 12.5000 mg | ORAL_TABLET | Freq: Two times a day (BID) | ORAL | 1 refills | Status: DC

## 2019-01-29 NOTE — Progress Notes (Signed)
Primary Physician:  [name], MD   Patient ID: William Black, male    DOB: [birth year], 78 y.o.   MRN: [id number]  Subjective:    Chief Complaint  Patient presents with  . PVCs  . Follow-up    4 weeks    HPI: William Black  is a 78 y.o. male  with hypertension, hyperlipidemia, on going tobacco use, recently evaluated by [place] for frequent PVC's.   Due to frequent PVC's on treadmill stress testing, he underwent lexiscan nuclear stress test and now presents to discuss results.    He is asymptomatic. Denies any chest pain, dyspnea, palpitations, leg edema, symptoms of claudication or TIA. States that hypertension and hyperlipidemia are well controlled.   He has not been as active over the last few months due to the pandemic. He does smoke, but is working to quit and has been able to cut back to less than 1/2 pack per day most days, but does smoke 1/2 pack on days that he is stressed. Only occasional alcohol use.   Underwent left inguinal hernia surgery in Jan 2020 without any complications.   Past Medical History:  Diagnosis Date  . Deafness in right ear   . High cholesterol   . Hyperlipemia 10/30/2018  . Hypertension   . Kidney stones   . Kidney stones     Past Surgical History:  Procedure Laterality Date  . APPENDECTOMY    . CHOLECYSTECTOMY N/A 01/29/2018   Procedure: LAPAROSCOPIC CHOLECYSTECTOMY WITH INTRAOPERATIVE CHOLANGIOGRAM;  Surgeon: Donnie Mesa, MD;  Location: [place];  Service: General;  Laterality: N/A;  . CYSTOSCOPY    . DENTAL SURGERY    . ERCP N/A 01/30/2018   Procedure: ENDOSCOPIC RETROGRADE CHOLANGIOPANCREATOGRAPHY (ERCP);  Surgeon: Arta Silence, MD;  Location: [facility] ENDOSCOPY;  Service: Endoscopy;  Laterality: N/A;  . INGUINAL HERNIA REPAIR Left 03/22/2018   Procedure: LEFT INGUINAL HERNIA REPAIR ERAS PATHWAY;  Surgeon: Donnie Mesa, MD;  Location: [place];  Service: General;  Laterality: Left;  . INSERTION OF MESH Left  03/22/2018   Procedure: INSERTION OF MESH;  Surgeon: Donnie Mesa, MD;  Location: [place];  Service: General;  Laterality: Left;  . REMOVAL OF STONES  01/30/2018   Procedure: REMOVAL OF STONES;  Surgeon: Arta Silence, MD;  Location: [facility] ENDOSCOPY;  Service: Endoscopy;;  . SPHINCTEROTOMY  01/30/2018   Procedure: SPHINCTEROTOMY;  Surgeon: Arta Silence, MD;  Location: [place] ENDOSCOPY;  Service: Endoscopy;;    Social History   Socioeconomic History  . Marital status: Married    Spouse name: Not on file  . Number of children: 1  . Years of education: Not on file  . Highest education level: Not on file  Occupational History  . Not on file  Social Needs  . Financial resource strain: Not on file  . Food insecurity    Worry: Not on file    Inability: Not on file  . Transportation needs    Medical: Not on file    Non-medical: Not on file  Tobacco Use  . Smoking status: Current Every Day Smoker    Packs/day: 0.50    Types: Cigarettes  . Smokeless tobacco: Never Used  . Tobacco comment: had cigarette at 4 am  Substance and Sexual Activity  . Alcohol use: Yes    Frequency: Never    Comment: occ  . Drug use: No  . Sexual activity: Not on file  Lifestyle  . Physical activity    Days per week:  Not on file    Minutes per session: Not on file  . Stress: Not on file  Relationships  . Social Herbalist on phone: Not on file    Gets together: Not on file    Attends religious service: Not on file    Active member of club or organization: Not on file    Attends meetings of clubs or organizations: Not on file    Relationship status: Not on file  . Intimate partner violence    Fear of current or ex partner: Not on file    Emotionally abused: Not on file    Physically abused: Not on file    Forced sexual activity: Not on file  Other Topics Concern  . Not on file  Social History Narrative  . Not on file    Review of Systems  Constitution: Negative for  decreased appetite, malaise/fatigue, weight gain and weight loss.  HENT: Positive for hearing loss (right ear).   Eyes: Negative for visual disturbance.  Cardiovascular: Negative for chest pain, claudication, dyspnea on exertion, leg swelling, orthopnea, palpitations and syncope.  Respiratory: Negative for hemoptysis and wheezing.   Endocrine: Negative for cold intolerance and heat intolerance.  Hematologic/Lymphatic: Does not bruise/bleed easily.  Skin: Negative for nail changes.  Musculoskeletal: Negative for muscle weakness and myalgias.  Gastrointestinal: Negative for abdominal pain, change in bowel habit, nausea and vomiting.  Neurological: Negative for difficulty with concentration, dizziness, focal weakness and headaches.  Psychiatric/Behavioral: Negative for altered mental status and suicidal ideas.  All other systems reviewed and are negative.     Objective:  Blood pressure 139/62, pulse (!) 46, temperature 98.3 F (36.8 C), height '5\' 9"'  (1.753 m), weight 147 lb (66.7 kg), SpO2 95 %. Body mass index is 21.71 kg/m.    Physical Exam  Constitutional: He is oriented to person, place, and time. Vital signs are normal. He appears well-developed and well-nourished.  HENT:  Head: Normocephalic and atraumatic.  Neck: Normal range of motion.  Cardiovascular: Normal rate, regular rhythm, normal heart sounds and intact distal pulses. Frequent extrasystoles are present.  Pulmonary/Chest: Effort normal and breath sounds normal. No accessory muscle usage. No respiratory distress.  Abdominal: Soft. Bowel sounds are normal.  Musculoskeletal: Normal range of motion.  Neurological: He is alert and oriented to person, place, and time.  Skin: Skin is warm and dry.  Vitals reviewed.  Radiology: No results found.  Laboratory examination:   PCP labs 10/03/2018: Hgb 48.6, Plt 126, CBC otherwise normal. Creatinine 1.1, K 3.9, eGFR 69, CMP normal.   CMP Latest Ref Rng & Units 01/31/2018  01/30/2018 01/29/2018  Glucose 70 - 99 mg/dL 163(H) 142(H) 113(H)  BUN 8 - 23 mg/dL '9 9 9  ' Creatinine 0.61 - 1.24 mg/dL 0.96 0.85 0.99  Sodium 135 - 145 mmol/L 137 138 137  Potassium 3.5 - 5.1 mmol/L 3.8 3.8 3.7  Chloride 98 - 111 mmol/L 106 106 106  CO2 22 - 32 mmol/L '25 24 23  ' Calcium 8.9 - 10.3 mg/dL 8.5(L) 8.5(L) 8.9  Total Protein 6.5 - 8.1 g/dL 5.5(L) 5.5(L) 6.4(L)  Total Bilirubin 0.3 - 1.2 mg/dL 0.7 0.7 0.8  Alkaline Phos 38 - 126 U/L 46 45 55  AST 15 - 41 U/L 36 50(H) 28  ALT 0 - 44 U/L 45(H) 53(H) 28   CBC Latest Ref Rng & Units 01/31/2018 01/30/2018 01/29/2018  WBC 4.0 - 10.5 K/uL 14.6(H) 17.0(H) 15.6(H)  Hemoglobin 13.0 - 17.0 g/dL 14.0 14.1  16.2  Hematocrit 39.0 - 52.0 % 40.2 42.2 46.5  Platelets 150 - 400 K/uL 98(L) 107(L) 129(L)   Lipid Panel  No results found for: CHOL, TRIG, HDL, CHOLHDL, VLDL, LDLCALC, LDLDIRECT HEMOGLOBIN A1C No results found for: HGBA1C, MPG TSH No results for input(s): TSH in the last 8760 hours.  PRN Meds:. There are no discontinued medications. Current Meds  Medication Sig  . aspirin EC 81 MG tablet Take 81 mg by mouth daily.  Marland Kitchen atorvastatin (LIPITOR) 20 MG tablet Take 20 mg by mouth at bedtime.   Marland Kitchen diltiazem (CARDIZEM CD) 360 MG 24 hr capsule Take 360 mg by mouth daily.  Marland Kitchen lisinopril (PRINIVIL,ZESTRIL) 40 MG tablet Take 40 mg by mouth 2 (two) times daily.    Cardiac Studies:   Lexiscan Tetrofosmin Stress Test  01/21/2019: Nondiagnostic ECG stress. Normal left ventricular systolic function. Normal perfusion. Stress LV EF: 62%.  No previous exam available for comparison. Low risk study.   Treadmill Exercise Stress 12/10/2018: Resting EKG demonstrates sinus rhythm with right atrial enlargement and frequent PACs and atrial couplets with aberrant conduction and nonspecific ST abnormality.  Stress arrhythmias includes occasional PVCs and frequent PACs with aberrant conduction. Normal blood pressure response.  Stress terminated due to  leg pain suggestive of claudication. Stress EKG was uninterpretable for ischemia due to frequent PACs, patient exercised for 3:16 min on Bruce protocol; achieved 4.94 METs at 97% of maximum predicted heart rate.  Chest pain is not present.   Clinical correlation is recommended.  Consider pharmacologic stress test if clinically indicated.  PCP echo 10/11/2018: Normal LVEF. Suggestive of diastolic dysfunction. Normal wall motion. Hypertrophy of right ventricle anterior wall. Mild mitral annular calcification with minimal MR. Mild TR.   Assessment:     ICD-10-CM   1. Frequent PVCs  I49.3 Holter monitor - 48 hour  2. Essential hypertension  I10   3. Tobacco use disorder  F17.200    EKG 10/31/2018: Normal sinus rhythm at 72 bpm, normal axis, frequent PVC's, ST depression in inferior leads, cannot exclude ischemia. Abnormal EKG.   Recommendations:   I discussed recently obtained Lexiscan nuclear stress test results that was considered however steady.  No perfusion abnormalities.  He continues to have frequent PVCs by physical exam today despite being on diltiazem 360 mg.  I will place him on metoprolol 12-1/2 mg twice daily.  He may need higher dose, but in view of his age we will slowly increase as tolerated.  I will also place him on 48-hour Holter monitor to evaluate his PVC burden.  Blood pressure is slightly elevated as well, but he states generally is higher at doctor's appointments.  Encouraged him to continue with home monitoring.  He is working on quitting smoking.  States some days he is able to smoke less than half a pack, but other days when he is extremely stressed he does smoke half a pack per day.  Have again stressed the importance of continued efforts towards completely quitting.  He will work on this.  I will see him back in 4 weeks to follow-up on Holter monitor results and frequent PVCs.   Miquel Dunn, MSN, APRN, FNP-C [facility]. [place] Office: [phone]  Fax: [phone]

## 2019-01-30 ENCOUNTER — Telehealth: Payer: Self-pay

## 2019-01-30 NOTE — Telephone Encounter (Signed)
S/w pt advised him ok to take meds together

## 2019-01-30 NOTE — Telephone Encounter (Signed)
Yes he can

## 2019-01-30 NOTE — Telephone Encounter (Signed)
Patient called and wanted to know if he can take metoprolol and lisinopril together?

## 2019-02-01 DIAGNOSIS — I493 Ventricular premature depolarization: Secondary | ICD-10-CM | POA: Diagnosis not present

## 2019-02-12 NOTE — Progress Notes (Signed)
48-hour Holter monitor 01/29/2019: Normal sinus rhythm with frequent PVCs (13.6%).  Rare PAC.  1 episode of atrial tachycardia for 4 beats at 103 bpm on day 2 at 1415.  No reported symptoms.  PVCs occurred as single, couplets, triplets, or in trigeminal pattern.  No A. fib was noted.

## 2019-02-26 ENCOUNTER — Other Ambulatory Visit: Payer: Self-pay

## 2019-02-26 ENCOUNTER — Encounter: Payer: Self-pay | Admitting: Cardiology

## 2019-02-26 ENCOUNTER — Ambulatory Visit: Payer: Medicare HMO | Admitting: Cardiology

## 2019-02-26 VITALS — BP 140/78 | HR 56 | Temp 98.0°F | Ht 68.0 in | Wt 148.0 lb

## 2019-02-26 DIAGNOSIS — I493 Ventricular premature depolarization: Secondary | ICD-10-CM | POA: Diagnosis not present

## 2019-02-26 DIAGNOSIS — I10 Essential (primary) hypertension: Secondary | ICD-10-CM | POA: Diagnosis not present

## 2019-02-26 MED ORDER — METOPROLOL TARTRATE 25 MG PO TABS: 12.5000 mg | ORAL_TABLET | Freq: Two times a day (BID) | ORAL | 6 refills | Status: DC

## 2019-02-26 NOTE — Progress Notes (Signed)
Primary Physician:  [name], MD   Patient ID: William Black, male    DOB: [birth year], 78 y.o.   MRN: [id number]  Subjective:    Chief Complaint  Patient presents with  . PVCs  . Follow-up    4 weeks  . Results    holter    HPI: William Black  is a 78 y.o. male  with hypertension, hyperlipidemia, on going tobacco use, recently evaluated by [place] for frequent PVC's.   Due to frequent PVC's on treadmill stress testing, he underwent lexiscan nuclear stress test that was low risk. He was placed on 48 hour holter that revealed PVC burden of 13%. He was started on metoprolol 12.5 mg BID at his last visit. Now presents for follow up.   He is asymptomatic.States that he feels well. No dizziness. Tolerating medications well. Denies any chest pain, dyspnea, palpitations, leg edema, symptoms of claudication or TIA. States that hypertension and hyperlipidemia are well controlled.   He has not been as active over the last few months due to the pandemic. He does smoke, but is working to quit and has been able to cut back to less than 1/2 pack per day most days, but does smoke 1/2 pack on days that he is stressed. Only occasional alcohol use.   Underwent left inguinal hernia surgery in Jan 2020 without any complications.   Past Medical History:  Diagnosis Date  . Deafness in right ear   . High cholesterol   . Hyperlipemia 10/30/2018  . Hypertension   . Kidney stones   . Kidney stones     Past Surgical History:  Procedure Laterality Date  . APPENDECTOMY    . CHOLECYSTECTOMY N/A 01/29/2018   Procedure: LAPAROSCOPIC CHOLECYSTECTOMY WITH INTRAOPERATIVE CHOLANGIOGRAM;  Surgeon: Donnie Mesa, MD;  Location: [place];  Service: General;  Laterality: N/A;  . CYSTOSCOPY    . DENTAL SURGERY    . ERCP N/A 01/30/2018   Procedure: ENDOSCOPIC RETROGRADE CHOLANGIOPANCREATOGRAPHY (ERCP);  Surgeon: Arta Silence, MD;  Location[facility] ENDOSCOPY;  Service: Endoscopy;  Laterality: N/A;  .  INGUINAL HERNIA REPAIR Left 03/22/2018   Procedure: LEFT INGUINAL HERNIA REPAIR ERAS PATHWAY;  Surgeon: Donnie Mesa, MD;  Location: [place];  Service: General;  Laterality: Left;  . INSERTION OF MESH Left 03/22/2018   Procedure: INSERTION OF MESH;  Surgeon: Donnie Mesa, MD;  Location: [place];  Service: General;  Laterality: Left;  . REMOVAL OF STONES  01/30/2018   Procedure: REMOVAL OF STONES;  Surgeon: Arta Silence, MD;  Location: [facility] ENDOSCOPY;  Service: Endoscopy;;  . SPHINCTEROTOMY  01/30/2018   Procedure: SPHINCTEROTOMY;  Surgeon: Arta Silence, MD;  Location: [place] ENDOSCOPY;  Service: Endoscopy;;    Social History   Socioeconomic History  . Marital status: Married    Spouse name: Not on file  . Number of children: 1  . Years of education: Not on file  . Highest education level: Not on file  Occupational History  . Not on file  Tobacco Use  . Smoking status: Current Every Day Smoker    Packs/day: 0.50    Types: Cigarettes  . Smokeless tobacco: Never Used  . Tobacco comment: had cigarette at 4 am  Substance and Sexual Activity  . Alcohol use: Yes    Comment: occ  . Drug use: No  . Sexual activity: Not on file  Other Topics Concern  . Not on file  Social History Narrative  . Not on file   Social  Determinants of Health   Financial Resource Strain:   . Difficulty of Paying Living Expenses: Not on file  Food Insecurity:   . Worried About Charity fundraiser in the Last Year: Not on file  . Ran Out of Food in the Last Year: Not on file  Transportation Needs:   . Lack of Transportation (Medical): Not on file  . Lack of Transportation (Non-Medical): Not on file  Physical Activity:   . Days of Exercise per Week: Not on file  . Minutes of Exercise per Session: Not on file  Stress:   . Feeling of Stress : Not on file  Social Connections:   . Frequency of Communication with Friends and Family: Not on file  . Frequency of Social  Gatherings with Friends and Family: Not on file  . Attends Religious Services: Not on file  . Active Member of Clubs or Organizations: Not on file  . Attends Archivist Meetings: Not on file  . Marital Status: Not on file  Intimate Partner Violence:   . Fear of Current or Ex-Partner: Not on file  . Emotionally Abused: Not on file  . Physically Abused: Not on file  . Sexually Abused: Not on file    Review of Systems  Constitution: Negative for decreased appetite, malaise/fatigue, weight gain and weight loss.  HENT: Positive for hearing loss (right ear).   Eyes: Negative for visual disturbance.  Cardiovascular: Negative for chest pain, claudication, dyspnea on exertion, leg swelling, orthopnea, palpitations and syncope.  Respiratory: Negative for hemoptysis and wheezing.   Endocrine: Negative for cold intolerance and heat intolerance.  Hematologic/Lymphatic: Does not bruise/bleed easily.  Skin: Negative for nail changes.  Musculoskeletal: Negative for muscle weakness and myalgias.  Gastrointestinal: Negative for abdominal pain, change in bowel habit, nausea and vomiting.  Neurological: Negative for difficulty with concentration, dizziness, focal weakness and headaches.  Psychiatric/Behavioral: Negative for altered mental status and suicidal ideas.  All other systems reviewed and are negative.     Objective:  Blood pressure 140/78, pulse (!) 56, temperature 98 F (36.7 C), height '5\' 8"'  (1.727 m), weight 148 lb (67.1 kg), SpO2 95 %. Body mass index is 22.5 kg/m.    Physical Exam  Constitutional: He is oriented to person, place, and time. Vital signs are normal. He appears well-developed and well-nourished.  HENT:  Head: Normocephalic and atraumatic.  Cardiovascular: Normal rate, regular rhythm, normal heart sounds and intact distal pulses.  Occasional extrasystoles are present.  Pulmonary/Chest: Effort normal and breath sounds normal. No accessory muscle usage. No  respiratory distress.  Abdominal: Soft. Bowel sounds are normal.  Musculoskeletal:        General: Normal range of motion.     Cervical back: Normal range of motion.  Neurological: He is alert and oriented to person, place, and time.  Skin: Skin is warm and dry.  Vitals reviewed.  Radiology: No results found.  Laboratory examination:   PCP labs 10/03/2018: Hgb 48.6, Plt 126, CBC otherwise normal. Creatinine 1.1, K 3.9, eGFR 69, CMP normal.   CMP Latest Ref Rng & Units 01/31/2018 01/30/2018 01/29/2018  Glucose 70 - 99 mg/dL 163(H) 142(H) 113(H)  BUN 8 - 23 mg/dL '9 9 9  ' Creatinine 0.61 - 1.24 mg/dL 0.96 0.85 0.99  Sodium 135 - 145 mmol/L 137 138 137  Potassium 3.5 - 5.1 mmol/L 3.8 3.8 3.7  Chloride 98 - 111 mmol/L 106 106 106  CO2 22 - 32 mmol/L '25 24 23  ' Calcium 8.9 -  10.3 mg/dL 8.5(L) 8.5(L) 8.9  Total Protein 6.5 - 8.1 g/dL 5.5(L) 5.5(L) 6.4(L)  Total Bilirubin 0.3 - 1.2 mg/dL 0.7 0.7 0.8  Alkaline Phos 38 - 126 U/L 46 45 55  AST 15 - 41 U/L 36 50(H) 28  ALT 0 - 44 U/L 45(H) 53(H) 28   CBC Latest Ref Rng & Units 01/31/2018 01/30/2018 01/29/2018  WBC 4.0 - 10.5 K/uL 14.6(H) 17.0(H) 15.6(H)  Hemoglobin 13.0 - 17.0 g/dL 14.0 14.1 16.2  Hematocrit 39.0 - 52.0 % 40.2 42.2 46.5  Platelets 150 - 400 K/uL 98(L) 107(L) 129(L)   Lipid Panel  No results found for: CHOL, TRIG, HDL, CHOLHDL, VLDL, LDLCALC, LDLDIRECT HEMOGLOBIN A1C No results found for: HGBA1C, MPG TSH No results for input(s): TSH in the last 8760 hours.  PRN Meds:. Medications Discontinued During This Encounter  Medication Reason  . metoprolol tartrate (LOPRESSOR) 25 MG tablet Reorder   Current Meds  Medication Sig  . aspirin EC 81 MG tablet Take 81 mg by mouth daily.  Marland Kitchen atorvastatin (LIPITOR) 20 MG tablet Take 20 mg by mouth at bedtime.   Marland Kitchen diltiazem (CARDIZEM CD) 360 MG 24 hr capsule Take 360 mg by mouth daily.  Marland Kitchen lisinopril (PRINIVIL,ZESTRIL) 40 MG tablet Take 40 mg by mouth 2 (two) times daily.  .  metoprolol tartrate (LOPRESSOR) 25 MG tablet Take 0.5 tablets (12.5 mg total) by mouth 2 (two) times daily.  . [DISCONTINUED] metoprolol tartrate (LOPRESSOR) 25 MG tablet Take 0.5 tablets (12.5 mg total) by mouth 2 (two) times daily.    Cardiac Studies:   48-hour Holter monitor 01/29/2019: Normal sinus rhythm with frequent PVCs (13.6%). Rare PAC. 1 episode of atrial tachycardia for 4 beats at 103 bpm on day 2 at 1415. No reported symptoms. PVCs occurred as single, couplets, triplets, or in trigeminal pattern. No A. fib was noted.  Lexiscan Tetrofosmin Stress Test  01/21/2019: Nondiagnostic ECG stress. Normal left ventricular systolic function. Normal perfusion. Stress LV EF: 62%.  No previous exam available for comparison. Low risk study.   Treadmill Exercise Stress 12/10/2018: Resting EKG demonstrates sinus rhythm with right atrial enlargement and frequent PACs and atrial couplets with aberrant conduction and nonspecific ST abnormality.  Stress arrhythmias includes occasional PVCs and frequent PACs with aberrant conduction. Normal blood pressure response.  Stress terminated due to leg pain suggestive of claudication. Stress EKG was uninterpretable for ischemia due to frequent PACs, patient exercised for 3:16 min on Bruce protocol; achieved 4.94 METs at 97% of maximum predicted heart rate.  Chest pain is not present.   Clinical correlation is recommended.  Consider pharmacologic stress test if clinically indicated.  PCP echo 10/11/2018: Normal LVEF. Suggestive of diastolic dysfunction. Normal wall motion. Hypertrophy of right ventricle anterior wall. Mild mitral annular calcification with minimal MR. Mild TR.   Assessment:     ICD-10-CM   1. Frequent PVCs  I49.3 EKG 12-Lead  2. Essential hypertension  I10    EKG 10/31/2018: Normal sinus rhythm at 72 bpm, normal axis, frequent PVC's, ST depression in inferior leads, cannot exclude ischemia. Abnormal EKG.   EKG 02/26/2019: Sinus  bradycardia at 51 bpm, normal axis, 1 PVC's, ST depression in inferior leads, cannot exclude ischemia. Abnormal EKG.   Recommendations:   Patient is doing well, continues to be asymptomatic.  Was noted to have PVC burden of 13% on Holter monitor.  On EKG today, he only had 1 PVC.  He is tolerating 12.5 mg of metoprolol tartrate twice daily well, no dizziness.  We will continue with his present medications.  He is advised to contact me for any shortness of breath, dizziness, or lightheadedness.  I will plan to see him back in 6 months, but encouraged him to contact me sooner if needed.   Miquel Dunn, MSN, APRN, FNP-C [facility]. [place] Office: [phone] Fax: [phone]

## 2019-04-16 DIAGNOSIS — N400 Enlarged prostate without lower urinary tract symptoms: Secondary | ICD-10-CM | POA: Diagnosis not present

## 2019-04-22 DIAGNOSIS — R351 Nocturia: Secondary | ICD-10-CM | POA: Diagnosis not present

## 2019-04-22 DIAGNOSIS — N403 Nodular prostate with lower urinary tract symptoms: Secondary | ICD-10-CM | POA: Diagnosis not present

## 2019-04-25 DIAGNOSIS — E782 Mixed hyperlipidemia: Secondary | ICD-10-CM | POA: Diagnosis not present

## 2019-04-25 DIAGNOSIS — I10 Essential (primary) hypertension: Secondary | ICD-10-CM | POA: Diagnosis not present

## 2019-05-09 DIAGNOSIS — N401 Enlarged prostate with lower urinary tract symptoms: Secondary | ICD-10-CM | POA: Diagnosis not present

## 2019-05-09 DIAGNOSIS — I10 Essential (primary) hypertension: Secondary | ICD-10-CM | POA: Diagnosis not present

## 2019-05-09 DIAGNOSIS — R69 Illness, unspecified: Secondary | ICD-10-CM | POA: Diagnosis not present

## 2019-05-09 DIAGNOSIS — E782 Mixed hyperlipidemia: Secondary | ICD-10-CM | POA: Diagnosis not present

## 2019-05-14 ENCOUNTER — Other Ambulatory Visit: Payer: Self-pay | Admitting: Internal Medicine

## 2019-05-14 DIAGNOSIS — F17200 Nicotine dependence, unspecified, uncomplicated: Secondary | ICD-10-CM

## 2019-05-22 ENCOUNTER — Other Ambulatory Visit: Payer: Self-pay | Admitting: Internal Medicine

## 2019-05-24 ENCOUNTER — Ambulatory Visit
Admission: RE | Admit: 2019-05-24 | Discharge: 2019-05-24 | Disposition: A | Discharge status: Home / Self Care | Payer: Medicare HMO | Source: Ambulatory Visit | Attending: Internal Medicine | Admitting: Internal Medicine

## 2019-05-24 ENCOUNTER — Other Ambulatory Visit: Payer: Self-pay

## 2019-05-24 DIAGNOSIS — F17200 Nicotine dependence, unspecified, uncomplicated: Secondary | ICD-10-CM

## 2019-05-24 DIAGNOSIS — Z87891 Personal history of nicotine dependence: Secondary | ICD-10-CM | POA: Diagnosis not present

## 2019-05-27 ENCOUNTER — Other Ambulatory Visit: Payer: Self-pay | Admitting: Internal Medicine

## 2019-05-27 DIAGNOSIS — F17200 Nicotine dependence, unspecified, uncomplicated: Secondary | ICD-10-CM

## 2019-08-27 ENCOUNTER — Other Ambulatory Visit: Payer: Self-pay

## 2019-08-27 ENCOUNTER — Ambulatory Visit: Payer: Medicare HMO | Admitting: Cardiology

## 2019-08-27 ENCOUNTER — Ambulatory Visit: Payer: Medicare HMO

## 2019-08-27 ENCOUNTER — Encounter: Payer: Self-pay | Admitting: Cardiology

## 2019-08-27 VITALS — BP 141/76 | HR 57 | Ht 68.0 in | Wt 149.0 lb

## 2019-08-27 DIAGNOSIS — I493 Ventricular premature depolarization: Secondary | ICD-10-CM

## 2019-08-27 DIAGNOSIS — E782 Mixed hyperlipidemia: Secondary | ICD-10-CM

## 2019-08-27 DIAGNOSIS — R69 Illness, unspecified: Secondary | ICD-10-CM | POA: Diagnosis not present

## 2019-08-27 DIAGNOSIS — R002 Palpitations: Secondary | ICD-10-CM | POA: Diagnosis not present

## 2019-08-27 DIAGNOSIS — I10 Essential (primary) hypertension: Secondary | ICD-10-CM

## 2019-08-27 DIAGNOSIS — F17210 Nicotine dependence, cigarettes, uncomplicated: Secondary | ICD-10-CM

## 2019-08-27 NOTE — Progress Notes (Signed)
William Black Date of Birth: 1940/07/13 MRN: [id number] Primary Care Provider:Ramachandran, Mauro Kaufmann, MD Former Cardiology Providers: Jeri Lager, APRN, FNP-C  Primary Cardiologist: Rex Kras, DO, [facility] (established care 08/27/2019)  Date: 08/27/19 Last Visit: 02/26/2019  Chief Complaint  Patient presents with  . PVC's  . Follow-up    HPI  William Black is a 79 y.o.  male who presents to the office with a chief complaint of " PVC follow-up." Patient's past medical history and cardiovascular risk factors include: hypertension, hyperlipidemia, current cigarette smoking, PVC, advance age.   Patient was originally under the care of Jeri Lager, APRN, FNP-C for the management of increased PVC burden. Patient was noted to have frequent PVCs on exercise treadmill stress test and therefore underwent Lexiscan which was reported to be low risk.  He had 48-hour Holter monitor last year which noted a PVC burden of approximately 13% per report he was started on metoprolol 12.5 mg p.o. twice daily.  Patient states that he is doing well with metoprolol 12.5 mg p.o. twice daily without any side effects or intolerances.  He does not have any chest pain or shortness of breath at rest or with effort related activities.  His overall functional status remains stable but overall reduced since COVID-19 pandemic.   Patient continues to smoke half a pack per day and has intentions to stop smoking.  He plans to discuss this further with his primary care provider at the upcoming office visit next week.  Patient's blood pressure is elevated at today's office visit.  Patient states that when he checks it at home it is very well controlled.  I have asked him to keep a log of his blood pressures and to take it in at his next office visit with his PCP for review.  Denies prior history of coronary artery disease, myocardial infarction, congestive heart failure, deep venous thrombosis, pulmonary embolism, stroke,  transient ischemic attack.  FUNCTIONAL STATUS: No structure exercise program or daily routine.    ALLERGIES: No Known Allergies   MEDICATION LIST PRIOR TO VISIT: Current Outpatient Medications on File Prior to Visit  Medication Sig Dispense Refill  . atorvastatin (LIPITOR) 20 MG tablet Take 20 mg by mouth at bedtime.     Marland Kitchen diltiazem (CARDIZEM CD) 360 MG 24 hr capsule Take 360 mg by mouth daily.    Marland Kitchen lisinopril (PRINIVIL,ZESTRIL) 40 MG tablet Take 40 mg by mouth 2 (two) times daily.    . metoprolol tartrate (LOPRESSOR) 25 MG tablet Take 0.5 tablets (12.5 mg total) by mouth 2 (two) times daily. 30 tablet 6   No current facility-administered medications on file prior to visit.    PAST MEDICAL HISTORY: Past Medical History:  Diagnosis Date  . Deafness in right ear   . High cholesterol   . Hyperlipemia 10/30/2018  . Hypertension   . Kidney stones   . Kidney stones     PAST SURGICAL HISTORY: Past Surgical History:  Procedure Laterality Date  . APPENDECTOMY    . CHOLECYSTECTOMY N/A 01/29/2018   Procedure: LAPAROSCOPIC CHOLECYSTECTOMY WITH INTRAOPERATIVE CHOLANGIOGRAM;  Surgeon: Donnie Mesa, MD;  Location: [place];  Service: General;  Laterality: N/A;  . CYSTOSCOPY    . DENTAL SURGERY    . ERCP N/A 01/30/2018   Procedure: ENDOSCOPIC RETROGRADE CHOLANGIOPANCREATOGRAPHY (ERCP);  Surgeon: Arta Silence, MD;  Location: [facility] ENDOSCOPY;  Service: Endoscopy;  Laterality: N/A;  . INGUINAL HERNIA REPAIR Left 03/22/2018   Procedure: LEFT INGUINAL HERNIA REPAIR ERAS PATHWAY;  Surgeon: Donnie Mesa, MD;  Location: [place];  Service: General;  Laterality: Left;  . INSERTION OF MESH Left 03/22/2018   Procedure: INSERTION OF MESH;  Surgeon: Donnie Mesa, MD;  Location: [place];  Service: General;  Laterality: Left;  . REMOVAL OF STONES  01/30/2018   Procedure: REMOVAL OF STONES;  Surgeon: Arta Silence, MD;  Location: [facility] ENDOSCOPY;  Service: Endoscopy;;  .  SPHINCTEROTOMY  01/30/2018   Procedure: SPHINCTEROTOMY;  Surgeon: Arta Silence, MD;  Location: [facility] ENDOSCOPY;  Service: Endoscopy;;    FAMILY HISTORY: The patient's family history is not on file.   SOCIAL HISTORY:  The patient  reports that he has been smoking cigarettes. He has been smoking about 0.50 packs per day. He has never used smokeless tobacco. He reports current alcohol use. He reports that he does not use drugs.  Review of Systems  Constitutional: Negative for chills and fever.  HENT: Negative for hoarse voice and nosebleeds.   Eyes: Negative for discharge, double vision and pain.  Cardiovascular: Negative for chest pain, claudication, dyspnea on exertion, leg swelling, near-syncope, orthopnea, palpitations, paroxysmal nocturnal dyspnea and syncope.  Respiratory: Negative for hemoptysis and shortness of breath.   Musculoskeletal: Negative for muscle cramps and myalgias.  Gastrointestinal: Negative for abdominal pain, constipation, diarrhea, hematemesis, hematochezia, melena, nausea and vomiting.  Neurological: Negative for dizziness and light-headedness.    PHYSICAL EXAM: Vitals with BMI 08/27/2019 02/26/2019 01/29/2019  Height '5\' 8"'  '5\' 8"'  '5\' 9"'   Weight 149 lbs 148 lbs 147 lbs  BMI 22.66 96.78 93.8  Systolic 101 751 025  Diastolic 76 78 62  Pulse 57 56 46    CONSTITUTIONAL: Well-developed and well-nourished. No acute distress.  SKIN: Skin is warm and dry. No rash noted. No cyanosis. No pallor. No jaundice HEAD: Normocephalic and atraumatic.  EYES: No scleral icterus MOUTH/THROAT: Moist oral membranes.  NECK: No JVD present. No thyromegaly noted.  LYMPHATIC: No visible cervical adenopathy.  CHEST Normal respiratory effort. No intercostal retractions  LUNGS: Decreased breath sounds at bases. No stridor. No wheezes. No rales.  CARDIOVASCULAR: Regular rate and rhythm, positive S1-S2, no murmurs rubs or gallops appreciated. ABDOMINAL: No apparent ascites.   EXTREMITIES: No peripheral edema  HEMATOLOGIC: No significant bruising NEUROLOGIC: Oriented to person, place, and time. Nonfocal. Normal muscle tone.  PSYCHIATRIC: Normal mood and affect. Normal behavior. Cooperative  CARDIAC DATABASE: EKG: 08/27/2019: Sinus  Bradycardia, 55bpm, Left atrial enlargement,anteroseptal infarct -age undetermined.  Echocardiogram: PCP echo 10/11/2018: Normal LVEF. Suggestive of diastolic dysfunction. Normal wall motion. Hypertrophy of right ventricle anterior wall. Mild mitral annular calcification with minimal MR. Mild TR.   Stress Testing:  Lexiscan Tetrofosmin Stress Test 01/21/2019: Nondiagnostic ECG stress. Normal left ventricular systolic function. Normal perfusion. Stress LV EF: 62%. Low risk study.   Heart Catheterization: None  48-hour Holter monitor 01/29/2019: Normal sinus rhythm with frequent PVCs (13.6%). Rare PAC. 1 episode of atrial tachycardia for 4 beats at 103 bpm on day 2 at 1415. No reported symptoms. PVCs occurred as single, couplets, triplets, or in trigeminal pattern. No A. fib was noted.  14-Day mobile cardiac ambulatory telemetry:  LABORATORY DATA: CBC Latest Ref Rng & Units 01/31/2018 01/30/2018 01/29/2018  WBC 4.0 - 10.5 K/uL 14.6(H) 17.0(H) 15.6(H)  Hemoglobin 13.0 - 17.0 g/dL 14.0 14.1 16.2  Hematocrit 39 - 52 % 40.2 42.2 46.5  Platelets 150 - 400 K/uL 98(L) 107(L) 129(L)    CMP Latest Ref Rng & Units 01/31/2018 01/30/2018 01/29/2018  Glucose 70 - 99 mg/dL 163(H) 142(H)  113(H)  BUN 8 - 23 mg/dL '9 9 9  ' Creatinine 0.61 - 1.24 mg/dL 0.96 0.85 0.99  Sodium 135 - 145 mmol/L 137 138 137  Potassium 3.5 - 5.1 mmol/L 3.8 3.8 3.7  Chloride 98 - 111 mmol/L 106 106 106  CO2 22 - 32 mmol/L '25 24 23  ' Calcium 8.9 - 10.3 mg/dL 8.5(L) 8.5(L) 8.9  Total Protein 6.5 - 8.1 g/dL 5.5(L) 5.5(L) 6.4(L)  Total Bilirubin 0.3 - 1.2 mg/dL 0.7 0.7 0.8  Alkaline Phos 38 - 126 U/L 46 45 55  AST 15 - 41 U/L 36 50(H) 28  ALT 0 - 44 U/L 45(H)  53(H) 28   PCP labs 10/03/2018: Hgb 48.6, Plt 126, CBC otherwise normal. Creatinine 1.1, K 3.9, eGFR 69, CMP normal.   IMPRESSION:    ICD-10-CM   1. Frequent PVCs  I49.3 EKG 12-Lead    HOLTER MONITOR - 24 HOUR  2. Essential hypertension  I10   3. Mixed hyperlipidemia  E78.2   4. Cigarette smoker  F17.210      RECOMMENDATIONS: William Black is a 79 y.o. male whose past medical history and cardiovascular risk factors include: hypertension, hyperlipidemia, current cigarette smoking, PVC, advance age.   Frequent PVCs:  Patient is currently on diltiazem and Lopressor 12.5 mg p.o. twice daily.  Clinically patient is doing well and is asymptomatic.  EKG shows sinus rhythm without ventricular ectopy and a physical examination did not appreciate any extrasystolic beats.  Would recommend 24-hour Holter monitor to reevaluate PVC burden.  Benign essential hypertension:  Patient states that his blood pressures are usually elevated when he sees physicians.   I have asked to keep a log of his blood pressures and to take it in to his next office visit with his PCP so medications can further be uptitrated if needed.  Low-salt diet recommended.  Medications reconciled.  Currently managed by PCP.  Mixed hyperlipidemia: Currently on statin therapy.  Managed by primary care physician.  FINAL MEDICATION LIST END OF ENCOUNTER: No orders of the defined types were placed in this encounter.    Current Outpatient Medications:  .  atorvastatin (LIPITOR) 20 MG tablet, Take 20 mg by mouth at bedtime. , Disp: , Rfl:  .  diltiazem (CARDIZEM CD) 360 MG 24 hr capsule, Take 360 mg by mouth daily., Disp: , Rfl:  .  lisinopril (PRINIVIL,ZESTRIL) 40 MG tablet, Take 40 mg by mouth 2 (two) times daily., Disp: , Rfl:  .  metoprolol tartrate (LOPRESSOR) 25 MG tablet, Take 0.5 tablets (12.5 mg total) by mouth 2 (two) times daily., Disp: 30 tablet, Rfl: 6  Orders Placed This Encounter  Procedures  . HOLTER  MONITOR - 24 HOUR  . EKG 12-Lead    --Continue cardiac medications as reconciled in final medication list. --Return in about 6 months (around 02/26/2020) for PVC follow up. Or sooner if needed. --Continue follow-up with your primary care physician regarding the management of your other chronic comorbid conditions.  Patient's questions and concerns were addressed to his satisfaction. He voices understanding of the instructions provided during this encounter.   This note was created using a voice recognition software as a result there may be grammatical errors inadvertently enclosed that do not reflect the nature of this encounter. Every attempt is made to correct such errors.  Rex Kras, [place], [facility]  Pager: [phone] Office: [phone]

## 2019-09-23 DIAGNOSIS — R21 Rash and other nonspecific skin eruption: Secondary | ICD-10-CM | POA: Diagnosis not present

## 2019-10-17 DIAGNOSIS — I10 Essential (primary) hypertension: Secondary | ICD-10-CM | POA: Diagnosis not present

## 2019-10-17 DIAGNOSIS — E782 Mixed hyperlipidemia: Secondary | ICD-10-CM | POA: Diagnosis not present

## 2019-10-24 DIAGNOSIS — N50819 Testicular pain, unspecified: Secondary | ICD-10-CM | POA: Diagnosis not present

## 2019-10-24 DIAGNOSIS — I700 Atherosclerosis of aorta: Secondary | ICD-10-CM | POA: Diagnosis not present

## 2019-10-24 DIAGNOSIS — E782 Mixed hyperlipidemia: Secondary | ICD-10-CM | POA: Diagnosis not present

## 2019-10-24 DIAGNOSIS — Z0000 Encounter for general adult medical examination without abnormal findings: Secondary | ICD-10-CM | POA: Diagnosis not present

## 2019-10-24 DIAGNOSIS — J439 Emphysema, unspecified: Secondary | ICD-10-CM | POA: Diagnosis not present

## 2019-10-24 DIAGNOSIS — I10 Essential (primary) hypertension: Secondary | ICD-10-CM | POA: Diagnosis not present

## 2019-10-24 DIAGNOSIS — R69 Illness, unspecified: Secondary | ICD-10-CM | POA: Diagnosis not present

## 2019-10-24 DIAGNOSIS — N401 Enlarged prostate with lower urinary tract symptoms: Secondary | ICD-10-CM | POA: Diagnosis not present

## 2019-10-28 ENCOUNTER — Telehealth: Payer: Self-pay

## 2019-10-28 NOTE — Telephone Encounter (Signed)
-----   Message from [place], [place] sent at 10/27/2019  2:25 PM EDT ----- Please inform the patient in the office follow-up visit sooner than his regular 69-month visit to discuss the monitor results as the PVC burden is still elevated.

## 2019-10-28 NOTE — Telephone Encounter (Signed)
Called pt to ask him if he could come in sooner due to a monitor.

## 2019-11-12 ENCOUNTER — Ambulatory Visit: Payer: Medicare HMO | Admitting: Cardiology

## 2019-11-12 ENCOUNTER — Other Ambulatory Visit: Payer: Self-pay

## 2019-11-12 ENCOUNTER — Encounter: Payer: Self-pay | Admitting: Cardiology

## 2019-11-12 VITALS — BP 145/81 | HR 52 | Resp 16 | Ht 68.0 in | Wt 149.0 lb

## 2019-11-12 DIAGNOSIS — E782 Mixed hyperlipidemia: Secondary | ICD-10-CM

## 2019-11-12 DIAGNOSIS — I493 Ventricular premature depolarization: Secondary | ICD-10-CM

## 2019-11-12 DIAGNOSIS — R69 Illness, unspecified: Secondary | ICD-10-CM | POA: Diagnosis not present

## 2019-11-12 DIAGNOSIS — Z712 Person consulting for explanation of examination or test findings: Secondary | ICD-10-CM | POA: Diagnosis not present

## 2019-11-12 DIAGNOSIS — I10 Essential (primary) hypertension: Secondary | ICD-10-CM

## 2019-11-12 DIAGNOSIS — F17210 Nicotine dependence, cigarettes, uncomplicated: Secondary | ICD-10-CM

## 2019-11-12 MED ORDER — METOPROLOL TARTRATE 25 MG PO TABS: 25.0000 mg | ORAL_TABLET | Freq: Two times a day (BID) | ORAL | 0 refills | Status: DC

## 2019-11-12 NOTE — Progress Notes (Signed)
William Black Date of Birth: 1940-12-01 MRN: [id number] Primary Care Provider:Ramachandran, Mauro Kaufmann, MD Former Cardiology Providers: Jeri Lager, APRN, FNP-C  Primary Cardiologist: Rex Kras, DO, [facility] (established care 08/27/2019)  Date: 11/12/19 Last Office Visit: 08/27/2019  Chief Complaint  Patient presents with   Frequent PVC's   Follow-up    HPI  William Black is a 79 y.o.  male who presents to the office with a chief complaint of " PVC follow-up." Patient's past medical history and cardiovascular risk factors include: hypertension, hyperlipidemia, current cigarette smoking, PVC, advance age.   Patient was originally under the care of Jeri Lager, APRN, FNP-C for the management of increased PVC burden. Patient was noted to have frequent PVCs on exercise treadmill stress test and therefore underwent Lexiscan which was reported to be low risk.  He had 48-hour Holter monitor last year which noted a PVC burden of approximately 13% per report he was started on metoprolol 12.5 mg p.o. twice daily.  Patient states that he is doing well with metoprolol 12.5 mg p.o. twice daily without any side effects or intolerances.  He does not have any chest pain or shortness of breath at rest or with effort related activities.  His overall functional status remains stable but overall reduced since COVID-19 pandemic.   The patient was doing well from a cardiovascular standpoint the plan was to repeat a 24-hour Holter monitor to reevaluate his PVC burden.  Since last office visit he did have a Holter monitor performed which noted that his PVC burden has increased to approximately 15%.  Patient still continues to be asymptomatic.  He does not have any chest pain or shortness of breath with effort related activities.  He does not feel tired fatigue.  And his LVEF as per last echo was preserved.  Unfortunately, he continues to smoke half a pack per day.  Patient's blood pressure is elevated at  today's office visit.  Patient states that when he checks it at home it is very well controlled.    Denies prior history of coronary artery disease, myocardial infarction, congestive heart failure, deep venous thrombosis, pulmonary embolism, stroke, transient ischemic attack.  FUNCTIONAL STATUS: No structure exercise program or daily routine.    ALLERGIES: No Known Allergies   MEDICATION LIST PRIOR TO VISIT: Current Outpatient Medications on File Prior to Visit  Medication Sig Dispense Refill   atorvastatin (LIPITOR) 20 MG tablet Take 20 mg by mouth at bedtime.      diltiazem (CARDIZEM CD) 360 MG 24 hr capsule Take 360 mg by mouth daily.     lisinopril (PRINIVIL,ZESTRIL) 40 MG tablet Take 40 mg by mouth 2 (two) times daily.     No current facility-administered medications on file prior to visit.    PAST MEDICAL HISTORY: Past Medical History:  Diagnosis Date   Deafness in right ear    High cholesterol    Hyperlipemia 10/30/2018   Hypertension    Kidney stones    Kidney stones     PAST SURGICAL HISTORY: Past Surgical History:  Procedure Laterality Date   APPENDECTOMY     CHOLECYSTECTOMY N/A 01/29/2018   Procedure: LAPAROSCOPIC CHOLECYSTECTOMY WITH INTRAOPERATIVE CHOLANGIOGRAM;  Surgeon: Donnie Mesa, MD;  Location: [place];  Service: General;  Laterality: N/A;   CYSTOSCOPY     DENTAL SURGERY     ERCP N/A 01/30/2018   Procedure: ENDOSCOPIC RETROGRADE CHOLANGIOPANCREATOGRAPHY (ERCP);  Surgeon: Arta Silence, MD;  Location: [facility] ENDOSCOPY;  Service: Endoscopy;  Laterality: N/A;   INGUINAL HERNIA REPAIR  Left 03/22/2018   Procedure: LEFT INGUINAL HERNIA REPAIR ERAS PATHWAY;  Surgeon: Donnie Mesa, MD;  Location: [place];  Service: General;  Laterality: Left;   INSERTION OF MESH Left 03/22/2018   Procedure: INSERTION OF MESH;  Surgeon: Donnie Mesa, MD;  Location: [place];  Service: General;  Laterality: Left;   REMOVAL OF  STONES  01/30/2018   Procedure: REMOVAL OF STONES;  Surgeon: Arta Silence, MD;  Location: [facility] ENDOSCOPY;  Service: Endoscopy;;   SPHINCTEROTOMY  01/30/2018   Procedure: Joan Mayans;  Surgeon: Arta Silence, MD;  Location: [facility] ENDOSCOPY;  Service: Endoscopy;;    FAMILY HISTORY: The patient's family history is not on file.   SOCIAL HISTORY:  The patient  reports that he has been smoking cigarettes. He has been smoking about 0.50 packs per day. He has never used smokeless tobacco. He reports current alcohol use. He reports that he does not use drugs.  Review of Systems  Constitutional: Negative for chills and fever.  HENT: Negative for hoarse voice and nosebleeds.   Eyes: Negative for discharge, double vision and pain.  Cardiovascular: Negative for chest pain, claudication, dyspnea on exertion, leg swelling, near-syncope, orthopnea, palpitations, paroxysmal nocturnal dyspnea and syncope.  Respiratory: Negative for hemoptysis and shortness of breath.   Musculoskeletal: Negative for muscle cramps and myalgias.  Gastrointestinal: Negative for abdominal pain, constipation, diarrhea, hematemesis, hematochezia, melena, nausea and vomiting.  Neurological: Negative for dizziness and light-headedness.    PHYSICAL EXAM: Vitals with BMI 11/12/2019 08/27/2019 02/26/2019  Height _0  _1  _2   Weight 149 lbs 149 lbs 148 lbs  BMI 22.66 01.09 32.35  Systolic 573 220 254  Diastolic 81 76 78  Pulse 52 57 56    CONSTITUTIONAL: Well-developed and well-nourished. No acute distress.  SKIN: Skin is warm and dry. No rash noted. No cyanosis. No pallor. No jaundice HEAD: Normocephalic and atraumatic.  EYES: No scleral icterus MOUTH/THROAT: Moist oral membranes.  NECK: No JVD present. No thyromegaly noted.  LYMPHATIC: No visible cervical adenopathy.  CHEST Normal respiratory effort. No intercostal retractions  LUNGS: Decreased breath sounds at bases. No stridor. No wheezes. No rales.   CARDIOVASCULAR: Regular rate and rhythm, positive S1-S2, no murmurs rubs or gallops appreciated. ABDOMINAL: No apparent ascites.  EXTREMITIES: No peripheral edema  HEMATOLOGIC: No significant bruising NEUROLOGIC: Oriented to person, place, and time. Nonfocal. Normal muscle tone.  PSYCHIATRIC: Normal mood and affect. Normal behavior. Cooperative  CARDIAC DATABASE: EKG: 08/27/2019: Sinus  Bradycardia, 55bpm, Left atrial enlargement,anteroseptal infarct -age undetermined.  Echocardiogram: PCP echo 10/11/2018: Normal LVEF. Suggestive of diastolic dysfunction. Normal wall motion. Hypertrophy of right ventricle anterior wall. Mild mitral annular calcification with minimal MR. Mild TR.   Stress Testing:  Lexiscan Tetrofosmin Stress Test 01/21/2019: Nondiagnostic ECG stress. Normal left ventricular systolic function. Normal perfusion. Stress LV EF: 62%. Low risk study.   Heart Catheterization: None  24 hour Holter monitor: Dominant rhythm sinus bradycardia, followed by normal sinus rhythm. Heart rate 37-62 bpm.  Average heart rate 54 bpm. No atrial fibrillation/atrial flutter/supraventricular tachycardia/ventricular tachycardia/high grade AV block, sinus pause greater than or equal to 3 seconds in duration. Ventricular ectopy was 9,936 isolated beats 9726, couplets and 5, bigeminy 886, trigeminy 996.  Total ventricular ectopic burden 15.1%. Supraventricular ectopy was 19 beats. Total supraventricular ectopic burden 0.03%. Number of patient triggered events: 0.   LABORATORY DATA: CBC Latest Ref Rng & Units 01/31/2018 01/30/2018 01/29/2018  WBC 4.0 - 10.5 K/uL 14.6(H) 17.0(H) 15.6(H)  Hemoglobin 13.0 -  17.0 g/dL 14.0 14.1 16.2  Hematocrit 39 - 52 % 40.2 42.2 46.5  Platelets 150 - 400 K/uL 98(L) 107(L) 129(L)    CMP Latest Ref Rng & Units 01/31/2018 01/30/2018 01/29/2018  Glucose 70 - 99 mg/dL 163(H) 142(H) 113(H)  BUN 8 - 23 mg/dL _0 Creatinine 0.61 - 1.24 mg/dL 0.96 0.85 0.99   Sodium 135 - 145 mmol/L 137 138 137  Potassium 3.5 - 5.1 mmol/L 3.8 3.8 3.7  Chloride 98 - 111 mmol/L 106 106 106  CO2 22 - 32 mmol/L _1 Calcium 8.9 - 10.3 mg/dL 8.5(L) 8.5(L) 8.9  Total Protein 6.5 - 8.1 g/dL 5.5(L) 5.5(L) 6.4(L)  Total Bilirubin 0.3 - 1.2 mg/dL 0.7 0.7 0.8  Alkaline Phos 38 - 126 U/L 46 45 55  AST 15 - 41 U/L 36 50(H) 28  ALT 0 - 44 U/L 45(H) 53(H) 28   PCP labs 10/03/2018: Hgb 48.6, Plt 126, CBC otherwise normal. Creatinine 1.1, K 3.9, eGFR 69, CMP normal.   IMPRESSION:    ICD-10-CM   1. Frequent PVCs  I49.3 metoprolol tartrate (LOPRESSOR) 25 MG tablet    PCV ECHOCARDIOGRAM COMPLETE    DISCONTINUED: metoprolol tartrate (LOPRESSOR) 25 MG tablet  2. Essential hypertension  I10   3. Mixed hyperlipidemia  E78.2   4. Encounter to discuss test results  Z71.2   5. Cigarette smoker  F17.210      RECOMMENDATIONS: Laden Bolin is a 79 y.o. male whose past medical history and cardiovascular risk factors include: hypertension, hyperlipidemia, current cigarette smoking, PVC, advance age.   Frequent PVCs:  Patient is currently on diltiazem and will increase Lopressor to 57m p.o. twice daily.  Clinically patient is doing well and is asymptomatic.  Reviewed the 24-hour monitor results including rhythm strips with the patient at today's office visit.  We will check an echocardiogram in 6 months to reevaluate LVEF.  If the patient's LVEF is reduced or if patient develops symptoms we will consider consult for cardiac electrophysiology for PVC ablation.  Patient is agreeable with the plan of care.  Given the rising COVID-19 pandemic he wants to hold off an EP evaluation for now.   Benign essential hypertension:  Patient states that his blood pressures are usually elevated when he sees physicians.   Medications reconciled.  Currently managed by PCP.  Mixed hyperlipidemia: Currently on statin therapy.  Managed by primary care physician.  FINAL MEDICATION  LIST END OF ENCOUNTER: Meds ordered this encounter  Medications   DISCONTD: metoprolol tartrate (LOPRESSOR) 25 MG tablet    Sig: Take 1 tablet (25 mg total) by mouth 2 (two) times daily for 180 doses.    Dispense:  180 tablet    Refill:  0   metoprolol tartrate (LOPRESSOR) 25 MG tablet    Sig: Take 1 tablet (25 mg total) by mouth 2 (two) times daily for 180 doses.    Dispense:  180 tablet    Refill:  0     Current Outpatient Medications:    atorvastatin (LIPITOR) 20 MG tablet, Take 20 mg by mouth at bedtime. , Disp: , Rfl:    diltiazem (CARDIZEM CD) 360 MG 24 hr capsule, Take 360 mg by mouth daily., Disp: , Rfl:    lisinopril (PRINIVIL,ZESTRIL) 40 MG tablet, Take 40 mg by mouth 2 (two) times daily., Disp: , Rfl:    metoprolol tartrate (LOPRESSOR) 25 MG tablet, Take 1 tablet (25 mg total) by mouth 2 (two) times daily for 180  doses., Disp: 180 tablet, Rfl: 0  Orders Placed This Encounter  Procedures   PCV ECHOCARDIOGRAM COMPLETE    --Continue cardiac medications as reconciled in final medication list. --Return in about 6 months (around 05/11/2020) for PVCs follow up.. Or sooner if needed. --Continue follow-up with your primary care physician regarding the management of your other chronic comorbid conditions.  Patient's questions and concerns were addressed to his satisfaction. He voices understanding of the instructions provided during this encounter.   This note was created using a voice recognition software as a result there may be grammatical errors inadvertently enclosed that do not reflect the nature of this encounter. Every attempt is made to correct such errors.  Rex Kras, [place], [facility]  Pager: [phone] Office: [phone]

## 2019-11-14 DIAGNOSIS — N499 Inflammatory disorder of unspecified male genital organ: Secondary | ICD-10-CM | POA: Diagnosis not present

## 2019-11-21 ENCOUNTER — Other Ambulatory Visit: Payer: Self-pay

## 2019-11-21 ENCOUNTER — Ambulatory Visit: Payer: Medicare HMO | Admitting: Dermatology

## 2019-11-21 ENCOUNTER — Encounter: Payer: Self-pay | Admitting: Dermatology

## 2019-11-21 DIAGNOSIS — Z872 Personal history of diseases of the skin and subcutaneous tissue: Secondary | ICD-10-CM | POA: Diagnosis not present

## 2019-11-21 DIAGNOSIS — L821 Other seborrheic keratosis: Secondary | ICD-10-CM | POA: Diagnosis not present

## 2019-11-21 DIAGNOSIS — R208 Other disturbances of skin sensation: Secondary | ICD-10-CM | POA: Diagnosis not present

## 2019-11-21 DIAGNOSIS — Z1283 Encounter for screening for malignant neoplasm of skin: Secondary | ICD-10-CM

## 2019-11-21 DIAGNOSIS — L905 Scar conditions and fibrosis of skin: Secondary | ICD-10-CM

## 2019-11-21 NOTE — Progress Notes (Signed)
Labs normal urine normal patient states no fungus on feet. Soap dove

## 2019-11-21 NOTE — Patient Instructions (Addendum)
First follow-up in years for William Black date of birth 08/09/1940. He has been plagued with discomfort of the scrotal sac with stinging or burning for months. His primary care doctor first gave him triple antibiotic and when that did not help he switched him to ketoconazole cream. When that too failed he consulted a urologist who switched him to triamcinolone which he just began a week ago but so far without improvement. Examination showed subtle redness with no active dermatitis no sign of yeast or fungus. Except for pincer great toenails, there is no abnormality on the feet, no sign of fungus. This represents cutaneous dysesthesia, which is actually related to nerve endings rather than being skin disease. I explained to Mr. Low that more testing of his urine or blood or biopsies or allergy tests is unlikely to help resolve the problem. While there is no FDA approved therapy for this, there are 3 topical agents: Pramoxine, capsaicin, and doxepin cream which may be helpful. These are usually used sequentially. Should topical therapy fail, his primary care doctor can decide whether he is a candidate for a neuroactive medication like gabapentin. Initially Anne will look for an over-the-counter product that contains pramoxine; two of the easier ones to find are Goldbond antiitch and CeraVe itch relief. He should apply 1 of these to the scrotal area daily after bathing whether or not it's burning at that time. Because these have no cortisone, he may reapply these as many times as he chooses for symptomatic relief. I have asked him to contact me via MyChart or by phone in 2 weeks to let me know if he is seeing any improvement. If not, we will try to obtain Zonalon or generic topical doxepin cream. The rest of his skin examination from the waist up showed no atypical moles or melanoma. Hundreds of clinically benign keratoses on the back do not require removal.  There is a small scar below the left arm  where a cyst was recently removed.

## 2019-12-12 DIAGNOSIS — Z23 Encounter for immunization: Secondary | ICD-10-CM | POA: Diagnosis not present

## 2019-12-17 ENCOUNTER — Encounter: Payer: Self-pay | Admitting: Dermatology

## 2019-12-17 NOTE — Progress Notes (Signed)
   New Patient   Subjective  William Black is a 79 y.o. male who presents for the following: Annual Exam (testical burning only has one triamcinolone, ketoconazole).  Burning Location: Scrotum Duration:  Quality:  Associated Signs/Symptoms: Modifying Factors: Topical ketoconazole plus triamcinolone benefit Severity:  Timing: Context:    The following portions of the chart were reviewed this encounter and updated as appropriate: Tobacco  Allergies  Meds  Problems  Med Hx  Surg Hx  Fam Hx      Objective  Well appearing patient in no apparent distress; mood and affect are within normal limits.  All skin waist up examined. Plus groin.   Assessment & Plan  Dysesthesia Posterior Scrotum  Topical containing pramoxine daily after bathing for 2 weeks and then contact me via MyChart.  If this fails, we will try to obtain a topical doxepin cream by prescription.  Skin exam for malignant neoplasm Mid Back  Self examine skin twice annually First follow-up in years for Cohan Leer date of birth Jan 28, 1941. He has been plagued with discomfort of the scrotal sac with stinging or burning for months. His primary care doctor first gave him triple antibiotic and when that did not help he switched him to ketoconazole cream. When that too failed he consulted a urologist who switched him to triamcinolone which he just began a week ago but so far without improvement. Examination showed subtle redness with no active dermatitis no sign of yeast or fungus. Except for pincer great toenails, there is no abnormality on the feet, no sign of fungus. This represents cutaneous dysesthesia, which is actually related to nerve endings rather than being skin disease. I explained to William Black that more testing of his urine or blood or biopsies or allergy tests is unlikely to help resolve the problem. While there is no FDA approved therapy for this, there are 3 topical agents: Pramoxine, capsaicin,  and doxepin cream which may be helpful. These are usually used sequentially. Should topical therapy fail, his primary care doctor can decide whether he is a candidate for a neuroactive medication like gabapentin. Initially William Black will look for an over-the-counter product that contains pramoxine; two of the easier ones to find are Goldbond antiitch and CeraVe itch relief. He should apply 1 of these to the scrotal area daily after bathing whether or not it's burning at that time. Because these have no cortisone, he may reapply these as many times as he chooses for symptomatic relief. I have asked him to contact me via MyChart or by phone in 2 weeks to let me know if he is seeing any improvement. If not, we will try to obtain Zonalon or generic topical doxepin cream. The rest of his skin examination from the waist up showed no atypical moles or melanoma. Hundreds of clinically benign keratoses on the back do not require removal.  There is a small scar below the left arm where a cyst was recently removed.

## 2020-02-26 ENCOUNTER — Ambulatory Visit: Payer: Medicare HMO | Admitting: Cardiology

## 2020-04-01 ENCOUNTER — Other Ambulatory Visit: Payer: Self-pay | Admitting: Cardiology

## 2020-04-01 DIAGNOSIS — I493 Ventricular premature depolarization: Secondary | ICD-10-CM

## 2020-04-23 DIAGNOSIS — R351 Nocturia: Secondary | ICD-10-CM | POA: Diagnosis not present

## 2020-04-23 DIAGNOSIS — N403 Nodular prostate with lower urinary tract symptoms: Secondary | ICD-10-CM | POA: Diagnosis not present

## 2020-05-04 ENCOUNTER — Other Ambulatory Visit: Payer: Self-pay

## 2020-05-04 ENCOUNTER — Ambulatory Visit: Payer: Medicare HMO

## 2020-05-04 DIAGNOSIS — I493 Ventricular premature depolarization: Secondary | ICD-10-CM | POA: Diagnosis not present

## 2020-05-04 DIAGNOSIS — I10 Essential (primary) hypertension: Secondary | ICD-10-CM | POA: Diagnosis not present

## 2020-05-11 NOTE — Progress Notes (Signed)
William Black Date of Birth: 03-09-1941 MRN: [id number] Primary Care Provider:Ramachandran, Mauro Kaufmann, MD Former Cardiology Providers: Jeri Lager, APRN, FNP-C  Primary Cardiologist: Rex Kras, DO, [facility] (established care 08/27/2019)  Date: 05/12/20 Last Office Visit: 11/12/2019  Chief Complaint  Patient presents with  . PVCs  . Follow-up    6 month     HPI  William Black is a 80 y.o.  male who presents to the office with a chief complaint of " 31-monthPVC follow-up." Patient's past medical history and cardiovascular risk factors include: hypertension, hyperlipidemia, current cigarette smoking, PVC, advance age.   Patient is being followed by our practice for the management of PVC burden.  During his prior exercise treadmill test patient was noted to have frequent PVCs.  He underwent monitor for 48 hours and was noted to have a PVC burden of approximately 13%.  He was started on metoprolol.  Clinically patient was doing well and the shared decision was to repeat a monitor to reevaluate PVC burden.  On repeat monitor patient was noted to have a PVC burden of approximately 15%.  However he remained asymptomatic and at the last office visit the shared decision was to continue current medical therapy.  Patient now presents for 687-monthollow-up.  Since last visit patient remains asymptomatic from a cardiovascular standpoint.  He tolerated the up titration of AV nodal blocking agents.  Patient is EKG notes sinus bradycardia without any significant ectopic beat.  No hospitalizations or urgent care visits since last office encounter.  Unfortunately, he continues to smoke but now 2-3 cigarette / day.   Patient's blood pressure is elevated at today's office visit.  Patient states that when he checks it at home it is very well controlled.    FUNCTIONAL STATUS: No structure exercise program or daily routine.    ALLERGIES: No Known Allergies   MEDICATION LIST PRIOR TO VISIT: Current  Outpatient Medications on File Prior to Visit  Medication Sig Dispense Refill  . atorvastatin (LIPITOR) 20 MG tablet Take 20 mg by mouth at bedtime.     . Marland Kitcheniltiazem (CARDIZEM CD) 360 MG 24 hr capsule Take 360 mg by mouth daily.    . Marland Kitchenisinopril (PRINIVIL,ZESTRIL) 40 MG tablet Take 40 mg by mouth 2 (two) times daily.    . metoprolol tartrate (LOPRESSOR) 25 MG tablet TAKE 1 TABLET BY MOUTH TWICE DAILY FOR 180 DOSES 180 tablet 0   No current facility-administered medications on file prior to visit.    PAST MEDICAL HISTORY: Past Medical History:  Diagnosis Date  . Deafness in right ear   . High cholesterol   . Hyperlipemia 10/30/2018  . Hypertension   . Kidney stones   . Kidney stones     PAST SURGICAL HISTORY: Past Surgical History:  Procedure Laterality Date  . APPENDECTOMY    . CHOLECYSTECTOMY N/A 01/29/2018   Procedure: LAPAROSCOPIC CHOLECYSTECTOMY WITH INTRAOPERATIVE CHOLANGIOGRAM;  Surgeon: TsDonnie MesaMD;  [facility]: [facility];  Laterality: N/A;  . CYSTOSCOPY    . DENTAL SURGERY    . ERCP N/A 01/30/2018   Procedure: ENDOSCOPIC RETROGRADE CHOLANGIOPANCREATOGRAPHY (ERCP);  Surgeon: OuArta SilenceMD;  [facility];  Service: Endoscopy;  Laterality: N/A;  . INGUINAL HERNIA REPAIR Left 03/22/2018   Procedure: LEFT INGUINAL HERNIA REPAIR ERAS PATHWAY;  Surgeon: TsDonnie MesaMD;  [place];  Laterality: Left;  . INSERTION OF MESH Left 03/22/2018   Procedure: INSERTION OF MESH;  Surgeon: TsDonnie MesaMD;  Location: [place];  Service: General;  Laterality: Left;  . REMOVAL OF STONES  01/30/2018   Procedure: REMOVAL OF STONES;  Surgeon: Arta Silence, MD;  Location: [facility] END[facility]COPY;  Service: Endoscopy;;  . SPHINCTEROTOMY  01/30/2018   Procedure: SPHINCTEROTOMY;  Surgeon: Arta Silence, MD;  Location: [facility] ENDOSCOPY;  Service: Endoscopy;;    FAMILY HISTORY: The patient's family history is  not on file.   SOCIAL HISTORY:  The patient  reports that he has been smoking cigarettes. He has been smoking about 0.25 packs per day. He has never used smokeless tobacco. He reports current alcohol use. He reports that he does not use drugs.  Review of Systems  Constitutional: Negative for chills and fever.  HENT: Negative for hoarse voice and nosebleeds.   Eyes: Negative for discharge, double vision and pain.  Cardiovascular: Negative for chest pain, claudication, dyspnea on exertion, leg swelling, near-syncope, orthopnea, palpitations, paroxysmal nocturnal dyspnea and syncope.  Respiratory: Negative for hemoptysis and shortness of breath.   Musculoskeletal: Negative for muscle cramps and myalgias.  Gastrointestinal: Negative for abdominal pain, constipation, diarrhea, hematemesis, hematochezia, melena, nausea and vomiting.  Neurological: Negative for dizziness and light-headedness.    PHYSICAL EXAM: Vitals with BMI 05/12/2020 11/12/2019 08/27/2019  Height _0  _1  _2   Weight 152 lbs 10 oz 149 lbs 149 lbs  BMI 23.21 30.16 01.09  Systolic 323 557 322  Diastolic 63 81 76  Pulse 66 52 57    CONSTITUTIONAL: Well-developed and well-nourished. No acute distress.  SKIN: Skin is warm and dry. No rash noted. No cyanosis. No pallor. No jaundice HEAD: Normocephalic and atraumatic.  EYES: No scleral icterus MOUTH/THROAT: Moist oral membranes.  NECK: No JVD present. No thyromegaly noted.  LYMPHATIC: No visible cervical adenopathy.  CHEST Normal respiratory effort. No intercostal retractions  LUNGS: Decreased breath sounds at bases. No stridor. No wheezes. No rales.  CARDIOVASCULAR: Regular rate and rhythm, positive S1-S2, no murmurs rubs or gallops appreciated. ABDOMINAL: No apparent ascites.  EXTREMITIES: No peripheral edema  HEMATOLOGIC: No significant bruising NEUROLOGIC: Oriented to person, place, and time. Nonfocal. Normal muscle tone.  PSYCHIATRIC: Normal mood and affect. Normal  behavior. Cooperative  CARDIAC DATABASE: EKG: 05/12/2020: Sinus bradycardia, 56 bpm, normal axis, without underlying injury pattern.  Echocardiogram: 05/04/2020:  Left ventricle cavity is normal in size and wall thickness. Normal global wall motion. Normal LV systolic function with EF 55%. Normal diastolic filling pattern.  Mild (Grade I) mitral regurgitation.  Mild tricuspid regurgitation. Estimated pulmonary artery systolic pressure 28 mmHg.  Stress Testing:  Lexiscan Tetrofosmin Stress Test 01/21/2019: Nondiagnostic ECG stress. Normal left ventricular systolic function. Normal perfusion. Stress LV EF: 62%. Low risk study.   Heart Catheterization: None  24 hour Holter monitor: Dominant rhythm sinus bradycardia, followed by normal sinus rhythm. Heart rate 37-62 bpm.  Average heart rate 54 bpm. No atrial fibrillation/atrial flutter/supraventricular tachycardia/ventricular tachycardia/high grade AV block, sinus pause greater than or equal to 3 seconds in duration. Ventricular ectopy was 9,936 isolated beats 9726, couplets and 5, bigeminy 886, trigeminy 996.  Total ventricular ectopic burden 15.1%. Supraventricular ectopy was 19 beats. Total supraventricular ectopic burden 0.03%. Number of patient triggered events: 0.   LABORATORY DATA: CBC Latest Ref Rng & Units 01/31/2018 01/30/2018 01/29/2018  WBC 4.0 - 10.5 K/uL 14.6(H) 17.0(H) 15.6(H)  Hemoglobin 13.0 - 17.0 g/dL 14.0 14.1 16.2  Hematocrit 39.0 - 52.0 % 40.2 42.2 46.5  Platelets 150 - 400 K/uL 98(L) 107(L) 129(L)    CMP Latest  Ref Rng & Units 01/31/2018 01/30/2018 01/29/2018  Glucose 70 - 99 mg/dL 163(H) 142(H) 113(H)  BUN 8 - 23 mg/dL _0 Creatinine 0.61 - 1.24 mg/dL 0.96 0.85 0.99  Sodium 135 - 145 mmol/L 137 138 137  Potassium 3.5 - 5.1 mmol/L 3.8 3.8 3.7  Chloride 98 - 111 mmol/L 106 106 106  CO2 22 - 32 mmol/L _1 Calcium 8.9 - 10.3 mg/dL 8.5(L) 8.5(L) 8.9  Total Protein 6.5 - 8.1 g/dL 5.5(L) 5.5(L) 6.4(L)   Total Bilirubin 0.3 - 1.2 mg/dL 0.7 0.7 0.8  Alkaline Phos 38 - 126 U/L 46 45 55  AST 15 - 41 U/L 36 50(H) 28  ALT 0 - 44 U/L 45(H) 53(H) 28   PCP labs 10/03/2018: Hgb 48.6, Plt 126, CBC otherwise normal. Creatinine 1.1, K 3.9, eGFR 69, CMP normal.   IMPRESSION:    ICD-10-CM   1. Frequent PVCs  I49.3 EKG 12-Lead    LONG TERM MONITOR (3-14 DAYS)    CANCELED: EKG 12-Lead  2. Essential hypertension  I10   3. Mixed hyperlipidemia  E78.2   4. Cigarette smoker  F17.210      RECOMMENDATIONS: Erlin Pelc is a 80 y.o. male whose past medical history and cardiovascular risk factors include: hypertension, hyperlipidemia, current cigarette smoking, PVC, advance age.   Frequent PVCs:  Currently on diltiazem and Lopressor.  Remains asymptomatic.  Echocardiogram since last office visit notes preserved LVEF without any significant valvular heart disease.  Shared decision is to continue current medical therapy for now.  Prior to the next office visit will have a Holter monitor performed to reevaluate his PVC burden.    Patient wishes to proceed with conservative management for now and does not want to consult with cardiac electrophysiology for possible PVC ablation.    Benign essential hypertension:  Patient states that his blood pressures are usually elevated when he sees physicians.   Medications reconciled.  Currently managed by PCP.  Mixed hyperlipidemia: Currently on statin therapy.  Managed by primary care physician.  FINAL MEDICATION LIST END OF ENCOUNTER: No orders of the defined types were placed in this encounter.    Current Outpatient Medications:  .  atorvastatin (LIPITOR) 20 MG tablet, Take 20 mg by mouth at bedtime. , Disp: , Rfl:  .  diltiazem (CARDIZEM CD) 360 MG 24 hr capsule, Take 360 mg by mouth daily., Disp: , Rfl:  .  lisinopril (PRINIVIL,ZESTRIL) 40 MG tablet, Take 40 mg by mouth 2 (two) times daily., Disp: , Rfl:  .  metoprolol tartrate (LOPRESSOR) 25 MG  tablet, TAKE 1 TABLET BY MOUTH TWICE DAILY FOR 180 DOSES, Disp: 180 tablet, Rfl: 0  Orders Placed This Encounter  Procedures  . LONG TERM MONITOR (3-14 DAYS)  . EKG 12-Lead   --Continue cardiac medications as reconciled in final medication list. --Return in about 7 months (around 12/12/2020) for Follow up PVCs after the monitor. . Or sooner if needed. --Continue follow-up with your primary care physician regarding the management of your other chronic comorbid conditions.  Patient's questions and concerns were addressed to his satisfaction. He voices understanding of the instructions provided during this encounter.   This note was created using a voice recognition software as a result there may be grammatical errors inadvertently enclosed that do not reflect the nature of this encounter. Every attempt is made to correct such errors.  Rex Kras, [place], [facility]  Pager: [phone] Office: [phone]

## 2020-05-12 ENCOUNTER — Other Ambulatory Visit: Payer: Self-pay

## 2020-05-12 ENCOUNTER — Encounter: Payer: Self-pay | Admitting: Cardiology

## 2020-05-12 ENCOUNTER — Ambulatory Visit: Payer: Medicare HMO | Admitting: Cardiology

## 2020-05-12 VITALS — BP 141/63 | HR 66 | Temp 98.0°F | Resp 16 | Ht 68.0 in | Wt 152.6 lb

## 2020-05-12 DIAGNOSIS — I493 Ventricular premature depolarization: Secondary | ICD-10-CM

## 2020-05-12 DIAGNOSIS — I10 Essential (primary) hypertension: Secondary | ICD-10-CM

## 2020-05-12 DIAGNOSIS — E782 Mixed hyperlipidemia: Secondary | ICD-10-CM

## 2020-05-12 DIAGNOSIS — F17210 Nicotine dependence, cigarettes, uncomplicated: Secondary | ICD-10-CM

## 2020-05-12 DIAGNOSIS — R69 Illness, unspecified: Secondary | ICD-10-CM | POA: Diagnosis not present

## 2020-05-13 ENCOUNTER — Inpatient Hospital Stay: Payer: Medicare HMO

## 2020-05-13 DIAGNOSIS — I493 Ventricular premature depolarization: Secondary | ICD-10-CM

## 2020-05-21 DIAGNOSIS — N401 Enlarged prostate with lower urinary tract symptoms: Secondary | ICD-10-CM | POA: Diagnosis not present

## 2020-05-21 DIAGNOSIS — J439 Emphysema, unspecified: Secondary | ICD-10-CM | POA: Diagnosis not present

## 2020-05-21 DIAGNOSIS — E782 Mixed hyperlipidemia: Secondary | ICD-10-CM | POA: Diagnosis not present

## 2020-05-21 DIAGNOSIS — I10 Essential (primary) hypertension: Secondary | ICD-10-CM | POA: Diagnosis not present

## 2020-05-25 ENCOUNTER — Ambulatory Visit
Admission: RE | Admit: 2020-05-25 | Discharge: 2020-05-25 | Disposition: A | Discharge status: Home / Self Care | Payer: Medicare HMO | Source: Ambulatory Visit | Attending: Internal Medicine | Admitting: Internal Medicine

## 2020-05-25 ENCOUNTER — Other Ambulatory Visit: Payer: Self-pay

## 2020-05-25 DIAGNOSIS — I2510 Atherosclerotic heart disease of native coronary artery without angina pectoris: Secondary | ICD-10-CM | POA: Diagnosis not present

## 2020-05-25 DIAGNOSIS — J432 Centrilobular emphysema: Secondary | ICD-10-CM | POA: Diagnosis not present

## 2020-05-25 DIAGNOSIS — J984 Other disorders of lung: Secondary | ICD-10-CM | POA: Diagnosis not present

## 2020-05-25 DIAGNOSIS — F17200 Nicotine dependence, unspecified, uncomplicated: Secondary | ICD-10-CM

## 2020-05-25 DIAGNOSIS — Z87891 Personal history of nicotine dependence: Secondary | ICD-10-CM | POA: Diagnosis not present

## 2020-05-28 DIAGNOSIS — I700 Atherosclerosis of aorta: Secondary | ICD-10-CM | POA: Diagnosis not present

## 2020-05-28 DIAGNOSIS — J432 Centrilobular emphysema: Secondary | ICD-10-CM | POA: Diagnosis not present

## 2020-05-28 DIAGNOSIS — I10 Essential (primary) hypertension: Secondary | ICD-10-CM | POA: Diagnosis not present

## 2020-05-28 DIAGNOSIS — E782 Mixed hyperlipidemia: Secondary | ICD-10-CM | POA: Diagnosis not present

## 2020-05-28 DIAGNOSIS — R69 Illness, unspecified: Secondary | ICD-10-CM | POA: Diagnosis not present

## 2020-07-10 ENCOUNTER — Other Ambulatory Visit: Payer: Self-pay | Admitting: Cardiology

## 2020-07-10 DIAGNOSIS — I493 Ventricular premature depolarization: Secondary | ICD-10-CM

## 2020-07-11 DIAGNOSIS — E782 Mixed hyperlipidemia: Secondary | ICD-10-CM | POA: Diagnosis not present

## 2020-07-11 DIAGNOSIS — I10 Essential (primary) hypertension: Secondary | ICD-10-CM | POA: Diagnosis not present

## 2020-07-11 DIAGNOSIS — I700 Atherosclerosis of aorta: Secondary | ICD-10-CM | POA: Diagnosis not present

## 2020-08-11 DIAGNOSIS — I10 Essential (primary) hypertension: Secondary | ICD-10-CM | POA: Diagnosis not present

## 2020-08-11 DIAGNOSIS — E782 Mixed hyperlipidemia: Secondary | ICD-10-CM | POA: Diagnosis not present

## 2020-08-11 DIAGNOSIS — I700 Atherosclerosis of aorta: Secondary | ICD-10-CM | POA: Diagnosis not present

## 2020-09-10 DIAGNOSIS — I10 Essential (primary) hypertension: Secondary | ICD-10-CM | POA: Diagnosis not present

## 2020-09-10 DIAGNOSIS — I700 Atherosclerosis of aorta: Secondary | ICD-10-CM | POA: Diagnosis not present

## 2020-09-10 DIAGNOSIS — E782 Mixed hyperlipidemia: Secondary | ICD-10-CM | POA: Diagnosis not present

## 2020-10-11 DIAGNOSIS — E782 Mixed hyperlipidemia: Secondary | ICD-10-CM | POA: Diagnosis not present

## 2020-10-11 DIAGNOSIS — I10 Essential (primary) hypertension: Secondary | ICD-10-CM | POA: Diagnosis not present

## 2020-10-11 DIAGNOSIS — I700 Atherosclerosis of aorta: Secondary | ICD-10-CM | POA: Diagnosis not present

## 2020-11-10 DIAGNOSIS — Z0000 Encounter for general adult medical examination without abnormal findings: Secondary | ICD-10-CM | POA: Diagnosis not present

## 2020-11-10 DIAGNOSIS — I10 Essential (primary) hypertension: Secondary | ICD-10-CM | POA: Diagnosis not present

## 2020-11-10 DIAGNOSIS — R945 Abnormal results of liver function studies: Secondary | ICD-10-CM | POA: Diagnosis not present

## 2020-11-10 DIAGNOSIS — R5383 Other fatigue: Secondary | ICD-10-CM | POA: Diagnosis not present

## 2020-11-10 DIAGNOSIS — E782 Mixed hyperlipidemia: Secondary | ICD-10-CM | POA: Diagnosis not present

## 2020-11-17 DIAGNOSIS — I700 Atherosclerosis of aorta: Secondary | ICD-10-CM | POA: Diagnosis not present

## 2020-11-17 DIAGNOSIS — Z0000 Encounter for general adult medical examination without abnormal findings: Secondary | ICD-10-CM | POA: Diagnosis not present

## 2020-11-17 DIAGNOSIS — N401 Enlarged prostate with lower urinary tract symptoms: Secondary | ICD-10-CM | POA: Diagnosis not present

## 2020-11-17 DIAGNOSIS — N200 Calculus of kidney: Secondary | ICD-10-CM | POA: Diagnosis not present

## 2020-11-17 DIAGNOSIS — J432 Centrilobular emphysema: Secondary | ICD-10-CM | POA: Diagnosis not present

## 2020-11-17 DIAGNOSIS — E782 Mixed hyperlipidemia: Secondary | ICD-10-CM | POA: Diagnosis not present

## 2020-11-17 DIAGNOSIS — I10 Essential (primary) hypertension: Secondary | ICD-10-CM | POA: Diagnosis not present

## 2020-11-17 DIAGNOSIS — R7303 Prediabetes: Secondary | ICD-10-CM | POA: Diagnosis not present

## 2020-11-17 DIAGNOSIS — R69 Illness, unspecified: Secondary | ICD-10-CM | POA: Diagnosis not present

## 2020-12-11 ENCOUNTER — Inpatient Hospital Stay: Payer: Medicare HMO

## 2020-12-14 ENCOUNTER — Ambulatory Visit: Payer: Medicare HMO | Admitting: Cardiology

## 2020-12-14 ENCOUNTER — Other Ambulatory Visit: Payer: Self-pay

## 2020-12-14 ENCOUNTER — Inpatient Hospital Stay: Payer: Medicare HMO

## 2020-12-14 DIAGNOSIS — I493 Ventricular premature depolarization: Secondary | ICD-10-CM | POA: Diagnosis not present

## 2020-12-22 DIAGNOSIS — Z23 Encounter for immunization: Secondary | ICD-10-CM | POA: Diagnosis not present

## 2021-01-04 DIAGNOSIS — I493 Ventricular premature depolarization: Secondary | ICD-10-CM | POA: Diagnosis not present

## 2021-01-11 DIAGNOSIS — I10 Essential (primary) hypertension: Secondary | ICD-10-CM | POA: Diagnosis not present

## 2021-01-11 DIAGNOSIS — E782 Mixed hyperlipidemia: Secondary | ICD-10-CM | POA: Diagnosis not present

## 2021-01-11 DIAGNOSIS — I700 Atherosclerosis of aorta: Secondary | ICD-10-CM | POA: Diagnosis not present

## 2021-01-14 ENCOUNTER — Encounter: Payer: Self-pay | Admitting: Cardiology

## 2021-01-14 ENCOUNTER — Other Ambulatory Visit: Payer: Self-pay

## 2021-01-14 ENCOUNTER — Ambulatory Visit: Payer: Medicare HMO | Admitting: Cardiology

## 2021-01-14 VITALS — BP 158/72 | HR 62 | Resp 16 | Ht 68.0 in | Wt 153.4 lb

## 2021-01-14 DIAGNOSIS — R69 Illness, unspecified: Secondary | ICD-10-CM | POA: Diagnosis not present

## 2021-01-14 DIAGNOSIS — E782 Mixed hyperlipidemia: Secondary | ICD-10-CM

## 2021-01-14 DIAGNOSIS — I10 Essential (primary) hypertension: Secondary | ICD-10-CM | POA: Diagnosis not present

## 2021-01-14 DIAGNOSIS — I493 Ventricular premature depolarization: Secondary | ICD-10-CM

## 2021-01-14 DIAGNOSIS — F17210 Nicotine dependence, cigarettes, uncomplicated: Secondary | ICD-10-CM

## 2021-01-14 MED ORDER — METOPROLOL TARTRATE 25 MG PO TABS: 25.0000 mg | ORAL_TABLET | Freq: Three times a day (TID) | ORAL | 1 refills | Status: DC

## 2021-01-14 NOTE — Progress Notes (Signed)
William Black Date of Birth: Aug 17, 1940 MRN: [id number] Primary Care Provider:Ramachandran, Mauro Kaufmann, MD Former Cardiology Providers: Jeri Lager, APRN, FNP-C  Primary Cardiologist: Rex Kras, DO, [facility] (established care 08/27/2019)  Date: 01/14/21 Last Office Visit: 05/12/2020  Chief Complaint  Patient presents with   Premature ventricular contractions    Follow-up    HPI  William Black is a 80 y.o.  male who presents to the office with a chief complaint of " 23-monthfollow-up for PVC management." Patient's past medical history and cardiovascular risk factors include: hypertension, hyperlipidemia, current cigarette smoking, PVC, advance age.   Patient is being followed by our practice for the management of PVC burden.  In the past patient underwent exercise treadmill stress test and was noted to have frequent PVCs.  He underwent extended Holter monitor and was noted to have a PVC burden of approximately 13%.  After initiating metoprolol therapy he had a repeat monitor which noted continued PVC burden of approximately 15%.  Since then calcium channel blockers were uptitrated and he had a repeat extended Holter monitor prior to this office visit.  Clinically patient is doing well from a cardiovascular standpoint.  He denies any chest pain, shortness of breath, tired, fatigue, near-syncope or syncope.  Most recent Holter monitor results reviewed with him in great detail and noted below for further reference.  Home blood pressures are well controlled compared to office readings.  FUNCTIONAL STATUS: No structure exercise program or daily routine.    ALLERGIES: No Known Allergies   MEDICATION LIST PRIOR TO VISIT: Current Outpatient Medications on File Prior to Visit  Medication Sig Dispense Refill   atorvastatin (LIPITOR) 20 MG tablet Take 20 mg by mouth at bedtime.      diltiazem (CARDIZEM CD) 360 MG 24 hr capsule Take 360 mg by mouth daily.     lisinopril (PRINIVIL,ZESTRIL)  40 MG tablet Take 40 mg by mouth 2 (two) times daily.     No current facility-administered medications on file prior to visit.    PAST MEDICAL HISTORY: Past Medical History:  Diagnosis Date   Deafness in right ear    High cholesterol    Hyperlipemia 10/30/2018   Hypertension    Kidney stones    Kidney stones     PAST SURGICAL HISTORY: Past Surgical History:  Procedure Laterality Date   APPENDECTOMY     CHOLECYSTECTOMY N/A 01/29/2018   Procedure: LAPAROSCOPIC CHOLECYSTECTOMY WITH INTRAOPERATIVE CHOLANGIOGRAM;  Surgeon: TDonnie Mesa MD;  [place]  Service: [place];  Laterality: N/A;   CYSTOSCOPY     DENTAL SURGERY     ERCP N/A 01/30/2018   Procedure: ENDOSCOPIC RETROGRADE CHOLANGIOPANCREATOGRAPHY (ERCP);  Surgeon: OArta Silence MD;  [facility];  Service: Endoscopy;  Laterality: N/A;   INGUINAL HERNIA REPAIR Left 03/22/2018   Procedure: LEFT INGUINAL HERNIA REPAIR ERAS PATHWAY;  Surgeon: TDonnie Mesa MD;  [place]  Service: General;  Laterality: Left;   INSERTION OF MESH Left 03/22/2018   Procedure: INSERTION OF MESH;  Surgeon: TDonnie Mesa MD;  [place]  Service: General;  Laterality: Left;   REMOVAL OF STONES  01/30/2018   Procedure: REMOVAL OF STONES;  Surgeon: OArta Silence MD;  [facility];  Service: Endoscopy;;   SPHINCTEROTOMY  01/30/2018   Procedure: SJoan Mayans  Surgeon: OArta Silence MD;  [facility];  Service: Endoscopy;;    FAMILY HISTORY: No family history of premature coronary disease or sudden cardiac death.   SOCIAL HISTORY:  The  patient  reports that he has been smoking cigarettes. He has been smoking an average of .25 packs per day. He has never used smokeless tobacco. He reports current alcohol use. He reports that he does not use drugs.  Review of Systems  Constitutional: Negative for chills and fever.  HENT:  Negative for hoarse voice and  nosebleeds.   Eyes:  Negative for discharge, double vision and pain.  Cardiovascular:  Negative for chest pain, claudication, dyspnea on exertion, leg swelling, near-syncope, orthopnea, palpitations, paroxysmal nocturnal dyspnea and syncope.  Respiratory:  Negative for hemoptysis and shortness of breath.   Musculoskeletal:  Negative for muscle cramps and myalgias.  Gastrointestinal:  Negative for abdominal pain, constipation, diarrhea, hematemesis, hematochezia, melena, nausea and vomiting.  Neurological:  Negative for dizziness and light-headedness.   PHYSICAL EXAM: Vitals with BMI 01/14/2021 01/14/2021 05/12/2020  Height - '5\' 8"'  '5\' 8"'   Weight - 153 lbs 6 oz 152 lbs 10 oz  BMI - 01.41 03.01  Systolic 314 388 875  Diastolic 72 82 63  Pulse 62 60 66    CONSTITUTIONAL: Well-developed and well-nourished. No acute distress.  SKIN: Skin is warm and dry. No rash noted. No cyanosis. No pallor. No jaundice HEAD: Normocephalic and atraumatic.  EYES: No scleral icterus MOUTH/THROAT: Moist oral membranes.  NECK: No JVD present. No thyromegaly noted.  LYMPHATIC: No visible cervical adenopathy.  CHEST Normal respiratory effort. No intercostal retractions  LUNGS: Decreased breath sounds at bases. No stridor. No wheezes. No rales.  CARDIOVASCULAR: Regular rate and rhythm, positive S1-S2, no murmurs rubs or gallops appreciated. ABDOMINAL: No apparent ascites.  EXTREMITIES: No peripheral edema  HEMATOLOGIC: No significant bruising NEUROLOGIC: Oriented to person, place, and time. Nonfocal. Normal muscle tone.  PSYCHIATRIC: Normal mood and affect. Normal behavior. Cooperative  CARDIAC DATABASE: EKG: 01/14/2021: Normal sinus rhythm, 62 bpm, nonspecific ST depressions, consider old anteroseptal infarct, occasional PVCs.   Echocardiogram: 05/04/2020:  Left ventricle cavity is normal in size and wall thickness. Normal global wall motion. Normal LV systolic function with EF 55%. Normal diastolic filling  pattern.  Mild (Grade I) mitral regurgitation.  Mild tricuspid regurgitation. Estimated pulmonary artery systolic pressure 28 mmHg.  Stress Testing:  Lexiscan Tetrofosmin Stress Test  01/21/2019: Nondiagnostic ECG stress. Normal left ventricular systolic function. Normal perfusion. Stress LV EF: 62%. Low risk study.   Heart Catheterization: None  24 hour Holter monitor: 08/27/2019 Dominant rhythm sinus bradycardia, followed by normal sinus rhythm. Heart rate 37-62 bpm.  Average heart rate 54 bpm. Total ventricular ectopic burden 15.1%. Total supraventricular ectopic burden 0.03%. Number of patient triggered events: 0.   14 day extended Holter monitor: 01/05/2021 Dominant rhythm normal sinus. Heart rate 37-152 bpm.  Avg HR 58 bpm. No atrial fibrillation, ventricular tachycardia, high grade AV block, pauses (3 seconds or longer). Total ventricular ectopic burden 8.8% (isolated beats 101,436, ventricular couplets and triplets less than 1%). Total supraventricular ectopic burden <1%. Patient triggered events: 0.   LABORATORY DATA: CBC Latest Ref Rng & Units 01/31/2018 01/30/2018 01/29/2018  WBC 4.0 - 10.5 K/uL 14.6(H) 17.0(H) 15.6(H)  Hemoglobin 13.0 - 17.0 g/dL 14.0 14.1 16.2  Hematocrit 39.0 - 52.0 % 40.2 42.2 46.5  Platelets 150 - 400 K/uL 98(L) 107(L) 129(L)    CMP Latest Ref Rng & Units 01/31/2018 01/30/2018 01/29/2018  Glucose 70 - 99 mg/dL 163(H) 142(H) 113(H)  BUN 8 - 23 mg/dL '9 9 9  ' Creatinine 0.61 - 1.24 mg/dL 0.96 0.85 0.99  Sodium 135 - 145 mmol/L 137 138  137  Potassium 3.5 - 5.1 mmol/L 3.8 3.8 3.7  Chloride 98 - 111 mmol/L 106 106 106  CO2 22 - 32 mmol/L '25 24 23  ' Calcium 8.9 - 10.3 mg/dL 8.5(L) 8.5(L) 8.9  Total Protein 6.5 - 8.1 g/dL 5.5(L) 5.5(L) 6.4(L)  Total Bilirubin 0.3 - 1.2 mg/dL 0.7 0.7 0.8  Alkaline Phos 38 - 126 U/L 46 45 55  AST 15 - 41 U/L 36 50(H) 28  ALT 0 - 44 U/L 45(H) 53(H) 28   PCP labs 10/03/2018: Hgb 48.6, Plt 126, CBC otherwise  normal. Creatinine 1.1, K 3.9, eGFR 69, CMP normal.   IMPRESSION:    ICD-10-CM   1. Frequent PVCs  I49.3 EKG 12-Lead    metoprolol tartrate (LOPRESSOR) 25 MG tablet    2. Essential hypertension  I10     3. Mixed hyperlipidemia  E78.2     4. Cigarette smoker  F17.210        RECOMMENDATIONS: William Black is a 80 y.o. male whose past medical history and cardiovascular risk factors include: hypertension, hyperlipidemia, current cigarette smoking, PVC, advance age.   Frequent PVCs: Remains asymptomatic. Denies feeling tired/fatigue/shortness of breath/near syncope/syncope. EKG shows normal sinus rhythm with rare PVCs. Will increase metoprolol to 25 mg p.o. 3 times daily.  Patient is asked to call the office if he feels lightheadedness, dizziness, tired, fatigue. Extended Holter monitor notes improvement in his overall PVC burden -which are multifocal. We discussed considering EP evaluation as his PVC burden remains elevated.  However, he would like to hold off on additional evaluation at this time. Monitor for now  Benign essential hypertension: Office blood pressures are elevated but home blood pressures are better controlled. Medications reconciled. Low-salt diet reemphasized. Currently managed by primary care provider.  Hyperlipidemia, mixed: Currently on atorvastatin.   He denies myalgia or other side effects. Currently managed by primary care provider.  FINAL MEDICATION LIST END OF ENCOUNTER: Meds ordered this encounter  Medications   metoprolol tartrate (LOPRESSOR) 25 MG tablet    Sig: Take 1 tablet (25 mg total) by mouth in the morning, at noon, and at bedtime.    Dispense:  180 tablet    Refill:  1     Current Outpatient Medications:    atorvastatin (LIPITOR) 20 MG tablet, Take 20 mg by mouth at bedtime. , Disp: , Rfl:    diltiazem (CARDIZEM CD) 360 MG 24 hr capsule, Take 360 mg by mouth daily., Disp: , Rfl:    lisinopril (PRINIVIL,ZESTRIL) 40 MG tablet, Take  40 mg by mouth 2 (two) times daily., Disp: , Rfl:    metoprolol tartrate (LOPRESSOR) 25 MG tablet, Take 1 tablet (25 mg total) by mouth in the morning, at noon, and at bedtime., Disp: 180 tablet, Rfl: 1  Orders Placed This Encounter  Procedures   EKG 12-Lead   --Continue cardiac medications as reconciled in final medication list. --Return in about 6 months (around 07/14/2021) for Follow up PVCs. Or sooner if needed. --Continue follow-up with your primary care physician regarding the management of your other chronic comorbid conditions.  Patient's questions and concerns were addressed to his satisfaction. He voices understanding of the instructions provided during this encounter.   This note was created using a voice recognition software as a result there may be grammatical errors inadvertently enclosed that do not reflect the nature of this encounter. Every attempt is made to correct such errors.  Rex Kras, [place], [facility]  Pager: [phone] Office: [phone]

## 2021-04-15 ENCOUNTER — Other Ambulatory Visit: Payer: Self-pay | Admitting: Cardiology

## 2021-04-15 DIAGNOSIS — I493 Ventricular premature depolarization: Secondary | ICD-10-CM

## 2021-05-10 DIAGNOSIS — N499 Inflammatory disorder of unspecified male genital organ: Secondary | ICD-10-CM | POA: Diagnosis not present

## 2021-05-10 DIAGNOSIS — Z125 Encounter for screening for malignant neoplasm of prostate: Secondary | ICD-10-CM | POA: Diagnosis not present

## 2021-05-10 DIAGNOSIS — N403 Nodular prostate with lower urinary tract symptoms: Secondary | ICD-10-CM | POA: Diagnosis not present

## 2021-05-10 DIAGNOSIS — R351 Nocturia: Secondary | ICD-10-CM | POA: Diagnosis not present

## 2021-05-18 DIAGNOSIS — I10 Essential (primary) hypertension: Secondary | ICD-10-CM | POA: Diagnosis not present

## 2021-05-18 DIAGNOSIS — R7303 Prediabetes: Secondary | ICD-10-CM | POA: Diagnosis not present

## 2021-05-18 DIAGNOSIS — E782 Mixed hyperlipidemia: Secondary | ICD-10-CM | POA: Diagnosis not present

## 2021-05-25 ENCOUNTER — Other Ambulatory Visit: Payer: Self-pay | Admitting: Internal Medicine

## 2021-05-25 DIAGNOSIS — R69 Illness, unspecified: Secondary | ICD-10-CM | POA: Diagnosis not present

## 2021-05-25 DIAGNOSIS — R21 Rash and other nonspecific skin eruption: Secondary | ICD-10-CM | POA: Diagnosis not present

## 2021-05-25 DIAGNOSIS — F17200 Nicotine dependence, unspecified, uncomplicated: Secondary | ICD-10-CM

## 2021-05-25 DIAGNOSIS — N200 Calculus of kidney: Secondary | ICD-10-CM | POA: Diagnosis not present

## 2021-05-25 DIAGNOSIS — J432 Centrilobular emphysema: Secondary | ICD-10-CM | POA: Diagnosis not present

## 2021-05-25 DIAGNOSIS — N401 Enlarged prostate with lower urinary tract symptoms: Secondary | ICD-10-CM | POA: Diagnosis not present

## 2021-05-25 DIAGNOSIS — I10 Essential (primary) hypertension: Secondary | ICD-10-CM | POA: Diagnosis not present

## 2021-05-25 DIAGNOSIS — R7303 Prediabetes: Secondary | ICD-10-CM | POA: Diagnosis not present

## 2021-05-25 DIAGNOSIS — E782 Mixed hyperlipidemia: Secondary | ICD-10-CM | POA: Diagnosis not present

## 2021-05-25 DIAGNOSIS — I700 Atherosclerosis of aorta: Secondary | ICD-10-CM | POA: Diagnosis not present

## 2021-06-17 ENCOUNTER — Other Ambulatory Visit: Payer: Medicare HMO

## 2021-06-24 DIAGNOSIS — H10811 Pingueculitis, right eye: Secondary | ICD-10-CM | POA: Diagnosis not present

## 2021-07-08 DIAGNOSIS — H10811 Pingueculitis, right eye: Secondary | ICD-10-CM | POA: Diagnosis not present

## 2021-07-08 DIAGNOSIS — H2513 Age-related nuclear cataract, bilateral: Secondary | ICD-10-CM | POA: Diagnosis not present

## 2021-07-15 ENCOUNTER — Encounter: Payer: Self-pay | Admitting: Cardiology

## 2021-07-15 ENCOUNTER — Ambulatory Visit: Payer: Medicare HMO | Admitting: Cardiology

## 2021-07-15 VITALS — BP 123/78 | HR 56 | Temp 98.0°F | Resp 17 | Ht 68.0 in | Wt 147.4 lb

## 2021-07-15 DIAGNOSIS — I10 Essential (primary) hypertension: Secondary | ICD-10-CM | POA: Diagnosis not present

## 2021-07-15 DIAGNOSIS — E782 Mixed hyperlipidemia: Secondary | ICD-10-CM | POA: Diagnosis not present

## 2021-07-15 DIAGNOSIS — I493 Ventricular premature depolarization: Secondary | ICD-10-CM | POA: Diagnosis not present

## 2021-07-15 NOTE — Progress Notes (Signed)
? ?William Black ?Date of Birth: [birth year] ?MRN: [id number] ?Primary Care Provider:Ramachandran, Mauro Kaufmann, MD ?Former Cardiology Providers: Jeri Lager, APRN, FNP-C  ?Primary Cardiologist: Alethia Berthold, PA-C, [facility] (established care 08/27/2019) ? ?Date: 07/15/21 ?Last Office Visit: 05/12/2020 ? ?Chief Complaint  ?Patient presents with  ? Follow-up  ? PVCs  ?  6 month  ? ? ?HPI  ?William Black is a 81 y.o.  male who presents to the office with a chief complaint of " 41-monthfollow-up for PVC management." Patient's past medical history and cardiovascular risk factors include: hypertension, hyperlipidemia, current cigarette smoking, PVC, advance age.  ? ?Patient is being followed by our practice for the management of PVC burden. ? ?In the past patient underwent exercise treadmill stress test and was noted to have frequent PVCs.  He underwent extended Holter monitor and was noted to have PVC burden as high as 15%.  AV nodal blocking agents have been uptitrated and repeat Holter monitor revealed PVC burden improved to 8.8%.  Given continued frequent PVCs at last office visit increased metoprolol to 25 mg p.o. 3 times daily.  ? ?Patient now presents for 643-monthollow-up.  He denies chest pain, shortness of breath, fatigue, syncope, near syncope, dizziness, lightheadedness or palpitations.  He is feeling quite well overall and has been doing lots of house projects requiring physical labor without issue. ? ?Blood pressure is well controlled. ? ?FUNCTIONAL STATUS: No structure exercise program or daily routine.   ? ?ALLERGIES: ?No Known Allergies ? ? ?MEDICATION LIST PRIOR TO VISIT: ?Current Outpatient Medications on File Prior to Visit  ?Medication Sig Dispense Refill  ? Ascorbic Acid (VITAMIN C PO) Take 1 tablet by mouth daily.    ? atorvastatin (LIPITOR) 20 MG tablet Take 20 mg by mouth at bedtime.     ? diltiazem (CARDIZEM CD) 360 MG 24 hr capsule Take 360 mg by mouth daily.    ? lisinopril (PRINIVIL,ZESTRIL) 40  MG tablet Take 40 mg by mouth 2 (two) times daily.    ? metoprolol tartrate (LOPRESSOR) 25 MG tablet Take 1 tablet by mouth twice daily 180 tablet 0  ? ?No current facility-administered medications on file prior to visit.  ? ? ?PAST MEDICAL HISTORY: ?Past Medical History:  ?Diagnosis Date  ? Deafness in right ear   ? High cholesterol   ? Hyperlipemia 10/30/2018  ? Hypertension   ? Kidney stones   ? Kidney stones   ? ? ?PAST SURGICAL HISTORY: ?Past Surgical History:  ?Procedure Laterality Date  ? APPENDECTOMY    ? CHOLECYSTECTOMY N/A 01/29/2018  ? Procedure: LAPAROSCOPIC CHOLECYSTECTOMY WITH INTRAOPERATIVE CHOLANGIOGRAM;  Surgeon: TsDonnie MesaMD;  [facility];  Laterality: N/A;  ? CYSTOSCOPY    ? DENTAL SURGERY    ? ERCP N/A 01/30/2018  ? Procedure: ENDOSCOPIC RETROGRADE CHOLANGIOPANCREATOGRAPHY (ERCP);  Surgeon: OuArta SilenceMD;  [facility];  Service: Endoscopy;  Laterality: N/A;  ? INGUINAL HERNIA REPAIR Left 03/22/2018  ? Procedure: LEFT INGUINAL HERNIA REPAIR ERAS PATHWAY;  Surgeon: TsDonnie MesaMD;  [facility];  Laterality: Left;  ? INSERTION OF MESH Left 03/22/2018  ? Procedure: INSERTION OF MESH;  Surgeon: TsDonnie MesaMD;  [facility];  Laterality: Left;  ? REMOVAL OF STONES  01/30/2018  ? Procedure: REMOVAL OF STONES;  Surgeon: OuArta SilenceMD;  [facility];  Service: Endoscopy;;  ? SPHINCTEROTOMY  01/30/2018  ? Procedure: SPHINCTEROTOMY;  Surgeon: OuArta Silence  MD;  [facility];  Service: Endoscopy;;  ? ? ?FAMILY HISTORY: ?No family history of premature coronary disease or sudden cardiac death. ?  ?SOCIAL HISTORY:  ?The patient  reports that he has been smoking cigarettes. He has a 17.50 pack-year smoking history. He has never used smokeless tobacco. He reports that he does not currently use alcohol. He reports that he does not use drugs. ? ?Review  of Systems  ?Constitutional: Negative for chills and fever.  ?HENT:  Negative for hoarse voice and nosebleeds.   ?Eyes:  Negative for discharge, double vision and pain.  ?Cardiovascular:  Negative for chest pain, claudication, dyspnea on exertion, leg swelling, near-syncope, orthopnea, palpitations, paroxysmal nocturnal dyspnea and syncope.  ?Respiratory:  Negative for hemoptysis and shortness of breath.   ?Musculoskeletal:  Negative for muscle cramps and myalgias.  ?Gastrointestinal:  Negative for abdominal pain, constipation, diarrhea, hematemesis, hematochezia, melena, nausea and vomiting.  ?Neurological:  Negative for dizziness and light-headedness.  ? ?PHYSICAL EXAM: ? ?  07/15/2021  ?  9:44 AM 01/14/2021  ?  2:01 PM 01/14/2021  ?  1:57 PM  ?Vitals with BMI  ?Height '5\' 8"'$   '5\' 8"'$   ?Weight 147 lbs 6 oz  153 lbs 6 oz  ?BMI 22.42  23.33  ?Systolic 629 476 546  ?Diastolic 78 72 82  ?Pulse 56 62 60  ? ? ?CONSTITUTIONAL: Well-developed and well-nourished. No acute distress.  ?SKIN: Skin is warm and dry. No rash noted. No cyanosis. No pallor. No jaundice ?HEAD: Normocephalic and atraumatic.  ?EYES: No scleral icterus ?MOUTH/THROAT: Moist oral membranes.  ?NECK: No JVD present. No thyromegaly noted.  ?LYMPHATIC: No visible cervical adenopathy.  ?CHEST Normal respiratory effort. No intercostal retractions  ?LUNGS: Decreased breath sounds at bases. No stridor. No wheezes. No rales.  ?CARDIOVASCULAR: Regular rate and rhythm, positive S1-S2, no murmurs rubs or gallops appreciated, extrasystoles noted ?ABDOMINAL: No apparent ascites.  ?EXTREMITIES: No peripheral edema  ?HEMATOLOGIC: No significant bruising ?NEUROLOGIC: Oriented to person, place, and time. Nonfocal. Normal muscle tone.  ?PSYCHIATRIC: Normal mood and affect. Normal behavior. Cooperative ? ?CARDIAC DATABASE: ?EKG: ?01/14/2021: Normal sinus rhythm, 62 bpm, nonspecific ST depressions, consider old anteroseptal infarct, occasional PVCs.  ?07/15/2021: Sinus rhythm with  frequent PVCs at a rate of 60 bpm.  Normal axis.  Consider old anteroseptal infarct. ? ?Echocardiogram: ?05/04/2020:  ?Left ventricle cavity is normal in size and wall thickness. Normal global wall motion. Normal LV systolic function with EF 55%. Normal diastolic filling pattern.  ?Mild (Grade I) mitral regurgitation.  ?Mild tricuspid regurgitation. Estimated pulmonary artery systolic pressure 28 mmHg. ? ?Stress Testing:  ?Lexiscan Tetrofosmin Stress Test  01/21/2019: ?Nondiagnostic ECG stress. ?Normal left ventricular systolic function. Normal perfusion. Stress LV EF: 62%. Low risk study.  ? ?Heart Catheterization: ?None ? ?24 hour Holter monitor: ?08/27/2019 ?Dominant rhythm sinus bradycardia, followed by normal sinus rhythm. ?Heart rate 37-62 bpm.  Average heart rate 54 bpm. ?Total ventricular ectopic burden 15.1%. ?Total supraventricular ectopic burden 0.03%. ?Number of patient triggered events: 0.  ? ?14 day extended Holter monitor: ?01/05/2021 ?Dominant rhythm normal sinus. ?Heart rate 37-152 bpm.  Avg HR 58 bpm. ?No atrial fibrillation, ventricular tachycardia, high grade AV block, pauses (3 seconds or longer). ?Total ventricular ectopic burden 8.8% (isolated beats 101,436, ventricular couplets and triplets less than 1%). ?Total supraventricular ectopic burden <1%. ?Patient triggered events: 0.  ? ?LABORATORY DATA: ? ?  Latest Ref Rng & Units 01/31/2018  ?  2:10 AM 01/30/2018  ?  3:18 AM 01/29/2018  ?  1:30 AM  ?CBC  ?WBC 4.0 - 10.5 K/uL 14.6   17.0   15.6    ?Hemoglobin 13.0 - 17.0 g/dL 14.0   14.1   16.2    ?Hematocrit 39.0 - 52.0 % 40.2   42.2   46.5    ?Platelets 150 - 400 K/uL 98   107   129    ? ? ? ?  Latest Ref Rng & Units 01/31/2018  ?  2:10 AM 01/30/2018  ?  3:18 AM 01/29/2018  ?  1:30 AM  ?CMP  ?Glucose 70 - 99 mg/dL 163   142   113    ?BUN 8 - 23 mg/dL '9   9   9    '$ ?Creatinine 0.61 - 1.24 mg/dL 0.96   0.85   0.99    ?Sodium 135 - 145 mmol/L 137   138   137    ?Potassium 3.5 - 5.1 mmol/L 3.8   3.8    3.7    ?Chloride 98 - 111 mmol/L 106   106   106    ?CO2 22 - 32 mmol/L '25   24   23    '$ ?Calcium 8.9 - 10.3 mg/dL 8.5   8.5   8.9    ?Total Protein 6.5 - 8.1 g/dL 5.5   5.5   6.4    ?Total Bilirubin 0.

## 2021-09-22 ENCOUNTER — Other Ambulatory Visit: Payer: Self-pay | Admitting: Cardiology

## 2021-09-22 DIAGNOSIS — I493 Ventricular premature depolarization: Secondary | ICD-10-CM

## 2021-11-23 DIAGNOSIS — I700 Atherosclerosis of aorta: Secondary | ICD-10-CM | POA: Diagnosis not present

## 2021-11-23 DIAGNOSIS — Z0000 Encounter for general adult medical examination without abnormal findings: Secondary | ICD-10-CM | POA: Diagnosis not present

## 2021-11-23 DIAGNOSIS — R7303 Prediabetes: Secondary | ICD-10-CM | POA: Diagnosis not present

## 2021-11-23 DIAGNOSIS — J432 Centrilobular emphysema: Secondary | ICD-10-CM | POA: Diagnosis not present

## 2021-11-23 DIAGNOSIS — I10 Essential (primary) hypertension: Secondary | ICD-10-CM | POA: Diagnosis not present

## 2021-11-23 DIAGNOSIS — F17200 Nicotine dependence, unspecified, uncomplicated: Secondary | ICD-10-CM | POA: Diagnosis not present

## 2021-11-23 DIAGNOSIS — E782 Mixed hyperlipidemia: Secondary | ICD-10-CM | POA: Diagnosis not present

## 2021-11-23 DIAGNOSIS — R69 Illness, unspecified: Secondary | ICD-10-CM | POA: Diagnosis not present

## 2021-11-23 DIAGNOSIS — R5383 Other fatigue: Secondary | ICD-10-CM | POA: Diagnosis not present

## 2021-11-23 DIAGNOSIS — Z23 Encounter for immunization: Secondary | ICD-10-CM | POA: Diagnosis not present

## 2021-11-30 DIAGNOSIS — E782 Mixed hyperlipidemia: Secondary | ICD-10-CM | POA: Diagnosis not present

## 2021-11-30 DIAGNOSIS — N182 Chronic kidney disease, stage 2 (mild): Secondary | ICD-10-CM | POA: Diagnosis not present

## 2021-11-30 DIAGNOSIS — I10 Essential (primary) hypertension: Secondary | ICD-10-CM | POA: Diagnosis not present

## 2021-11-30 DIAGNOSIS — R7303 Prediabetes: Secondary | ICD-10-CM | POA: Diagnosis not present

## 2021-11-30 DIAGNOSIS — I700 Atherosclerosis of aorta: Secondary | ICD-10-CM | POA: Diagnosis not present

## 2021-11-30 DIAGNOSIS — J432 Centrilobular emphysema: Secondary | ICD-10-CM | POA: Diagnosis not present

## 2021-11-30 DIAGNOSIS — N200 Calculus of kidney: Secondary | ICD-10-CM | POA: Diagnosis not present

## 2021-11-30 DIAGNOSIS — R69 Illness, unspecified: Secondary | ICD-10-CM | POA: Diagnosis not present

## 2021-11-30 DIAGNOSIS — Z0000 Encounter for general adult medical examination without abnormal findings: Secondary | ICD-10-CM | POA: Diagnosis not present

## 2021-11-30 DIAGNOSIS — N401 Enlarged prostate with lower urinary tract symptoms: Secondary | ICD-10-CM | POA: Diagnosis not present

## 2022-01-04 DIAGNOSIS — Z86010 Personal history of colonic polyps: Secondary | ICD-10-CM | POA: Diagnosis not present

## 2022-01-04 DIAGNOSIS — K529 Noninfective gastroenteritis and colitis, unspecified: Secondary | ICD-10-CM | POA: Diagnosis not present

## 2022-02-09 DIAGNOSIS — D123 Benign neoplasm of transverse colon: Secondary | ICD-10-CM | POA: Diagnosis not present

## 2022-02-09 DIAGNOSIS — Z86010 Personal history of colonic polyps: Secondary | ICD-10-CM | POA: Diagnosis not present

## 2022-02-09 DIAGNOSIS — K648 Other hemorrhoids: Secondary | ICD-10-CM | POA: Diagnosis not present

## 2022-02-09 DIAGNOSIS — K5730 Diverticulosis of large intestine without perforation or abscess without bleeding: Secondary | ICD-10-CM | POA: Diagnosis not present

## 2022-02-09 DIAGNOSIS — Z09 Encounter for follow-up examination after completed treatment for conditions other than malignant neoplasm: Secondary | ICD-10-CM | POA: Diagnosis not present

## 2022-02-11 DIAGNOSIS — D123 Benign neoplasm of transverse colon: Secondary | ICD-10-CM | POA: Diagnosis not present

## 2022-02-12 ENCOUNTER — Other Ambulatory Visit: Payer: Self-pay | Admitting: Cardiology

## 2022-02-12 DIAGNOSIS — I493 Ventricular premature depolarization: Secondary | ICD-10-CM

## 2022-05-16 DIAGNOSIS — Z125 Encounter for screening for malignant neoplasm of prostate: Secondary | ICD-10-CM | POA: Diagnosis not present

## 2022-06-06 DIAGNOSIS — N430 Encysted hydrocele: Secondary | ICD-10-CM | POA: Diagnosis not present

## 2022-06-06 DIAGNOSIS — N503 Cyst of epididymis: Secondary | ICD-10-CM | POA: Diagnosis not present

## 2022-06-20 DIAGNOSIS — N401 Enlarged prostate with lower urinary tract symptoms: Secondary | ICD-10-CM | POA: Diagnosis not present

## 2022-06-20 DIAGNOSIS — N182 Chronic kidney disease, stage 2 (mild): Secondary | ICD-10-CM | POA: Diagnosis not present

## 2022-06-20 DIAGNOSIS — N200 Calculus of kidney: Secondary | ICD-10-CM | POA: Diagnosis not present

## 2022-06-20 DIAGNOSIS — I10 Essential (primary) hypertension: Secondary | ICD-10-CM | POA: Diagnosis not present

## 2022-06-20 DIAGNOSIS — I700 Atherosclerosis of aorta: Secondary | ICD-10-CM | POA: Diagnosis not present

## 2022-06-20 DIAGNOSIS — E782 Mixed hyperlipidemia: Secondary | ICD-10-CM | POA: Diagnosis not present

## 2022-06-20 DIAGNOSIS — F17200 Nicotine dependence, unspecified, uncomplicated: Secondary | ICD-10-CM | POA: Diagnosis not present

## 2022-06-20 DIAGNOSIS — R69 Illness, unspecified: Secondary | ICD-10-CM | POA: Diagnosis not present

## 2022-06-20 DIAGNOSIS — J432 Centrilobular emphysema: Secondary | ICD-10-CM | POA: Diagnosis not present

## 2022-06-20 DIAGNOSIS — R7303 Prediabetes: Secondary | ICD-10-CM | POA: Diagnosis not present

## 2022-06-20 LAB — BASIC METABOLIC PANEL WITH GFR
BUN: 11 (ref 4–21)
BUN: 11 (ref 4–21)
CO2: 24 — AB (ref 13–22)
CO2: 24 — AB (ref 13–22)
Chloride: 108 (ref 99–108)
Chloride: 108 (ref 99–108)
Creatinine: 1.1 (ref 0.6–1.3)
Glucose: 109
Glucose: 109
Potassium: 4 meq/L (ref 3.5–5.1)
Potassium: 4 meq/L (ref 3.5–5.1)
Sodium: 144 (ref 137–147)
Sodium: 144 (ref 137–147)

## 2022-06-20 LAB — HEPATIC FUNCTION PANEL
ALT: 39 U/L (ref 10–40)
AST: 30 (ref 14–40)
Alkaline Phosphatase: 72 (ref 25–125)
Bilirubin, Total: 0.8

## 2022-06-20 LAB — COMPREHENSIVE METABOLIC PANEL WITH GFR
Albumin: 3.9 (ref 3.5–5.0)
Calcium: 8.8 (ref 8.7–10.7)

## 2022-06-20 LAB — LIPID PANEL
Cholesterol: 105 (ref 0–200)
HDL: 59 (ref 35–70)
LDL Cholesterol: 32
LDl/HDL Ratio: 0.5
Triglycerides: 69 (ref 40–160)

## 2022-06-20 LAB — HEMOGLOBIN A1C: Hemoglobin A1C: 6

## 2022-06-27 DIAGNOSIS — E782 Mixed hyperlipidemia: Secondary | ICD-10-CM | POA: Diagnosis not present

## 2022-06-27 DIAGNOSIS — I10 Essential (primary) hypertension: Secondary | ICD-10-CM | POA: Diagnosis not present

## 2022-06-27 DIAGNOSIS — I700 Atherosclerosis of aorta: Secondary | ICD-10-CM | POA: Diagnosis not present

## 2022-06-27 DIAGNOSIS — J432 Centrilobular emphysema: Secondary | ICD-10-CM | POA: Diagnosis not present

## 2022-06-27 DIAGNOSIS — N182 Chronic kidney disease, stage 2 (mild): Secondary | ICD-10-CM | POA: Diagnosis not present

## 2022-06-27 DIAGNOSIS — R69 Illness, unspecified: Secondary | ICD-10-CM | POA: Diagnosis not present

## 2022-06-27 DIAGNOSIS — N200 Calculus of kidney: Secondary | ICD-10-CM | POA: Diagnosis not present

## 2022-06-27 DIAGNOSIS — R7303 Prediabetes: Secondary | ICD-10-CM | POA: Diagnosis not present

## 2022-06-27 DIAGNOSIS — N401 Enlarged prostate with lower urinary tract symptoms: Secondary | ICD-10-CM | POA: Diagnosis not present

## 2022-06-30 ENCOUNTER — Other Ambulatory Visit: Payer: Self-pay | Admitting: Internal Medicine

## 2022-06-30 DIAGNOSIS — F17200 Nicotine dependence, unspecified, uncomplicated: Secondary | ICD-10-CM

## 2022-07-05 DIAGNOSIS — N430 Encysted hydrocele: Secondary | ICD-10-CM | POA: Diagnosis not present

## 2022-07-14 DIAGNOSIS — H2513 Age-related nuclear cataract, bilateral: Secondary | ICD-10-CM | POA: Diagnosis not present

## 2022-07-14 DIAGNOSIS — H0289 Other specified disorders of eyelid: Secondary | ICD-10-CM | POA: Diagnosis not present

## 2022-07-14 DIAGNOSIS — H04123 Dry eye syndrome of bilateral lacrimal glands: Secondary | ICD-10-CM | POA: Diagnosis not present

## 2022-07-14 DIAGNOSIS — H10811 Pingueculitis, right eye: Secondary | ICD-10-CM | POA: Diagnosis not present

## 2022-07-18 ENCOUNTER — Encounter: Payer: Self-pay | Admitting: Cardiology

## 2022-07-18 ENCOUNTER — Ambulatory Visit: Payer: Medicare HMO | Admitting: Cardiology

## 2022-07-18 VITALS — BP 132/82 | HR 44 | Resp 16 | Ht 68.0 in | Wt 144.4 lb

## 2022-07-18 DIAGNOSIS — E782 Mixed hyperlipidemia: Secondary | ICD-10-CM

## 2022-07-18 DIAGNOSIS — I10 Essential (primary) hypertension: Secondary | ICD-10-CM

## 2022-07-18 DIAGNOSIS — Z01810 Encounter for preprocedural cardiovascular examination: Secondary | ICD-10-CM

## 2022-07-18 DIAGNOSIS — F17210 Nicotine dependence, cigarettes, uncomplicated: Secondary | ICD-10-CM | POA: Diagnosis not present

## 2022-07-18 DIAGNOSIS — I493 Ventricular premature depolarization: Secondary | ICD-10-CM | POA: Diagnosis not present

## 2022-07-18 NOTE — Progress Notes (Signed)
[name] Date of Birth: [birth year] MRN: [id number] Primary Care Provider:Ramachandran, Campbell Lerner, MD Former Cardiology Providers: Altamese [place], APRN, FNP-C  Primary Cardiologist: Tessa Lerner, DO, [facility] (established care 08/27/2019)  Date: 07/18/22 Last Office Visit: 01/14/2021  Chief Complaint  Patient presents with   PVC's   Follow-up    HPI  William Black is a 82 y.o.  male whose past medical history and cardiovascular risk factors include: hypertension, hyperlipidemia, current cigarette smoking, PVC, advance age.   Patient is being followed by the practice for PVC management.  During his prior stress test he was noted to have frequent PVCs follow-up cardiac monitor noted a PVC burden of 13%.  He was started on metoprolol and follow-up cardiac monitor and noted a PVC burden of 15%.  We transitioned to calcium channel blockers and repeat cardiac monitor noted a PVC burden of approximately 8.8% as of October 2022.  Patient was noted to have multifocal PVCs.  We have discussed considering EP consultation; however, at that time he wanted to hold off.  He presents today for 2-month follow-up visit.  Review of his medication list today provides notes that he is currently on Lopressor 25 mg p.o. every evening along with diltiazem 360 mg p.o. every evening.   Clinically denies any anginal chest pain or heart failure symptoms.  Patient is scheduled for hydrocele procedure on 07/20/2022.  He also had labs with PCP since last office encounter.  FUNCTIONAL STATUS: No structure exercise program or daily routine.    ALLERGIES: No Known Allergies   MEDICATION LIST PRIOR TO VISIT: Current Outpatient Medications on File Prior to Visit  Medication Sig Dispense Refill   Ascorbic Acid (VITAMIN C PO) Take 1 tablet by mouth daily.     atorvastatin (LIPITOR) 20 MG tablet Take 20 mg by mouth at bedtime.      diltiazem (CARDIZEM CD) 360 MG 24 hr capsule Take 360 mg by mouth daily.      lisinopril (PRINIVIL,ZESTRIL) 40 MG tablet Take 40 mg by mouth 2 (two) times daily.     metoprolol tartrate (LOPRESSOR) 25 MG tablet Take 1 tablet by mouth twice daily 180 tablet 1   No current facility-administered medications on file prior to visit.    PAST MEDICAL HISTORY: Past Medical History:  Diagnosis Date   Deafness in right ear    High cholesterol    Hyperlipemia 10/30/2018   Hypertension    Kidney stones    Kidney stones     PAST SURGICAL HISTORY: Past Surgical History:  Procedure Laterality Date   APPENDECTOMY     CHOLECYSTECTOMY N/A 01/29/2018   Procedure: LAPAROSCOPIC CHOLECYSTECTOMY WITH INTRAOPERATIVE CHOLANGIOGRAM;  Surgeon: Manus Rudd, MD;  [facility];  Service: General;  Laterality: N/A;   CYSTOSCOPY     DENTAL SURGERY     ERCP N/A 01/30/2018   Procedure: ENDOSCOPIC RETROGRADE CHOLANGIOPANCREATOGRAPHY (ERCP);  Surgeon: Willis Modena, MD;  Location[facility] ENDOSCOPY;  Service: Endoscopy;  Laterality: N/A;   INGUINAL HERNIA REPAIR Left 03/22/2018   Procedure: LEFT INGUINAL HERNIA REPAIR ERAS PATHWAY;  Surgeon: Manus Rudd, MD;  Location: [facility];  Service: General;  Laterality: Left;   INSERTION OF MESH Left 03/22/2018   Procedure: INSERTION OF MESH;  Surgeon: Manus Rudd, MD;  Location: [facility];  Service: General;  Laterality: Left;   REMOVAL OF STONES  01/30/2018   Procedure: REMOVAL OF STONES;  Surgeon: Willis Modena, MD;  [facility] END[facility]COPY;  Service: Endoscopy;;   SPHINCTEROTOMY  01/30/2018  Procedure: SPHINCTEROTOMY;  Surgeon: Willis Modena, MD;  Location: [facility] ENDOSCOPY;  Service: Endoscopy;;    FAMILY HISTORY: No family history of premature coronary disease or sudden cardiac death.   SOCIAL HISTORY:  The patient  reports that he has been smoking cigarettes. He has a 17.50 pack-year smoking history. He has never used smokeless tobacco. He reports that he does not currently use alcohol. He reports that  he does not use drugs.  Review of Systems  Constitutional: Negative for chills and fever.  HENT:  Negative for hoarse voice and nosebleeds.   Eyes:  Negative for discharge, double vision and pain.  Cardiovascular:  Negative for chest pain, claudication, dyspnea on exertion, leg swelling, near-syncope, orthopnea, palpitations, paroxysmal nocturnal dyspnea and syncope.  Respiratory:  Negative for hemoptysis and shortness of breath.   Musculoskeletal:  Negative for muscle cramps and myalgias.  Gastrointestinal:  Negative for abdominal pain, constipation, diarrhea, hematemesis, hematochezia, melena, nausea and vomiting.  Neurological:  Negative for dizziness and light-headedness.    PHYSICAL EXAM:    07/18/2022    9:48 AM 07/15/2021    9:44 AM 01/14/2021    2:01 PM  Vitals with BMI  Height 5\' 8"  5\' 8"    Weight 144 lbs 6 oz 147 lbs 6 oz   BMI 21.96 22.42   Systolic 132 123 161  Diastolic 82 78 72  Pulse 44 56 62   Physical Exam  Constitutional: No distress.  Age appropriate, hemodynamically stable.   Neck: No JVD present.  Cardiovascular: Normal rate, regular rhythm, S1 normal, S2 normal, intact distal pulses and normal pulses. Exam reveals no gallop, no S3 and no S4.  No murmur heard. Pulmonary/Chest: Effort normal and breath sounds normal. No stridor. He has no wheezes. He has no rales.  Abdominal: Soft. Bowel sounds are normal. He exhibits no distension. There is no abdominal tenderness.  Musculoskeletal:        General: No edema.     Cervical back: Neck supple.  Neurological: He is alert and oriented to person, place, and time. He has intact cranial nerves (2-12).  Skin: Skin is warm and moist.   CARDIAC DATABASE: EKG: 05/18/2022: Sinus bradycardia, 53 bpm, LAE, consider old anteroseptal infarct.  When compared to prior EKG 07/15/2021 PVCs no longer present..  Echocardiogram: 05/04/2020:  Left ventricle cavity is normal in size and wall thickness. Normal global wall motion. Normal  LV systolic function with EF 55%. Normal diastolic filling pattern.  Mild (Grade I) mitral regurgitation.  Mild tricuspid regurgitation. Estimated pulmonary artery systolic pressure 28 mmHg.  Stress Testing:  Lexiscan Tetrofosmin Stress Test  01/21/2019: Nondiagnostic ECG stress. Normal left ventricular systolic function. Normal perfusion. Stress LV EF: 62%. Low risk study.   Heart Catheterization: None  24 hour Holter monitor: 08/27/2019: PVC burden 15.1%, average heart rate 54 bpm, predominant rhythm sinus bradycardia, see report for additional details.  14 day extended Holter monitor: 01/05/2021: PVC burden 8.8%, average heart rate 58 bpm, predominant rhythm sinus, see report for additional details   LABORATORY DATA:    Latest Ref Rng & Units 01/31/2018    2:10 AM 01/30/2018    3:18 AM 01/29/2018    1:30 AM  CBC  WBC 4.0 - 10.5 K/uL 14.6  17.0  15.6   Hemoglobin 13.0 - 17.0 g/dL 09.6  04.5  40.9   Hematocrit 39.0 - 52.0 % 40.2  42.2  46.5   Platelets 150 - 400 K/uL 98  107  129  Latest Ref Rng & Units 01/31/2018    2:10 AM 01/30/2018    3:18 AM 01/29/2018    1:30 AM  CMP  Glucose 70 - 99 mg/dL 161  096  045   BUN 8 - 23 mg/dL 9  9  9    Creatinine 0.61 - 1.24 mg/dL 4.09  8.11  9.14   Sodium 135 - 145 mmol/L 137  138  137   Potassium 3.5 - 5.1 mmol/L 3.8  3.8  3.7   Chloride 98 - 111 mmol/L 106  106  106   CO2 22 - 32 mmol/L 25  24  23    Calcium 8.9 - 10.3 mg/dL 8.5  8.5  8.9   Total Protein 6.5 - 8.1 g/dL 5.5  5.5  6.4   Total Bilirubin 0.3 - 1.2 mg/dL 0.7  0.7  0.8   Alkaline Phos 38 - 126 U/L 46  45  55   AST 15 - 41 U/L 36  50  28   ALT 0 - 44 U/L 45  53  28    PCP labs 10/03/2018: Hgb 48.6, Plt 126, CBC otherwise normal. Creatinine 1.1, K 3.9, eGFR 69, CMP normal.   IMPRESSION:    ICD-10-CM   1. Preop cardiovascular exam  Z01.810     2. Frequent PVCs  I49.3 EKG 12-Lead    3. Essential hypertension  I10     4. Mixed hyperlipidemia  E78.2      5. Cigarette smoker  F17.210        RECOMMENDATIONS: Kellar Liberti is a 82 y.o. male whose past medical history and cardiovascular risk factors include: hypertension, hyperlipidemia, current cigarette smoking, PVC, advance age.   Preop cardiovascular exam Patient plans undergo hydrocele procedure with Dr. Marlou Porch on 07/20/2022. Denies anginal chest pain or heart failure symptoms.   No prior history of severe valvular heart disease, myocardial infarction, stroke. Patient is overall acceptable risk for upcoming noncardiac surgery.  Frequent PVCs History of PVC burden.  Currently on AV nodal blocking agents. EKG today does not show ventricular ectopy. Will proceed with a 3-day Zio patch prior to the next office visit to reevaluate PVC burden.  After his surgery have asked him to stop Lopressor 25 mg p.o. daily and continue Cardizem at the current dose.  Essential hypertension Office blood pressures are well-controlled. Medications reconciled. Currently managed by primary care provider.  Mixed hyperlipidemia Currently on Lipitor.   Advised him to take Lipitor at night. He denies myalgia or other side effects. Most recent lipid profile from April 2024 independently reviewed and noted above for further reference. Currently managed by primary care provider.  Cigarette smoker Tobacco cessation counseling: Currently smoking 0.5 packs/day   Patient was informed of the dangers of tobacco abuse including stroke, cancer, and MI, as well as benefits of tobacco cessation. Patient is not willing to quit at this time. 5 mins were spent counseling patient cessation techniques. We discussed various methods to help quit smoking, including deciding on a date to quit, joining a support group, pharmacological agents- nicotine gum/patch/lozenges.  Recommend that he has a AAA screening given his age and history of cigarette smoking-will defer to PCP I will reassess his progress at the next follow-up  visit.  FINAL MEDICATION LIST END OF ENCOUNTER: No orders of the defined types were placed in this encounter.    Current Outpatient Medications:    Ascorbic Acid (VITAMIN C PO), Take 1 tablet by mouth daily., Disp: , Rfl:    atorvastatin (  LIPITOR) 20 MG tablet, Take 20 mg by mouth at bedtime. , Disp: , Rfl:    diltiazem (CARDIZEM CD) 360 MG 24 hr capsule, Take 360 mg by mouth daily., Disp: , Rfl:    lisinopril (PRINIVIL,ZESTRIL) 40 MG tablet, Take 40 mg by mouth 2 (two) times daily., Disp: , Rfl:    metoprolol tartrate (LOPRESSOR) 25 MG tablet, Take 1 tablet by mouth twice daily, Disp: 180 tablet, Rfl: 1  Orders Placed This Encounter  Procedures   EKG 12-Lead   --Continue cardiac medications as reconciled in final medication list. --No follow-ups on file. Or sooner if needed. --Continue follow-up with your primary care physician regarding the management of your other chronic comorbid conditions.  Patient's questions and concerns were addressed to his satisfaction. He voices understanding of the instructions provided during this encounter.   This note was created using a voice recognition software as a result there may be grammatical errors inadvertently enclosed that do not reflect the nature of this encounter. Every attempt is made to correct such errors.  Tessa Lerner, [place], [facility]  Pager:  [phone] Office: [phone]

## 2022-07-20 DIAGNOSIS — N430 Encysted hydrocele: Secondary | ICD-10-CM | POA: Diagnosis not present

## 2022-07-20 DIAGNOSIS — N442 Benign cyst of testis: Secondary | ICD-10-CM | POA: Diagnosis not present

## 2022-07-26 DIAGNOSIS — N430 Encysted hydrocele: Secondary | ICD-10-CM | POA: Diagnosis not present

## 2022-08-17 DIAGNOSIS — H2513 Age-related nuclear cataract, bilateral: Secondary | ICD-10-CM | POA: Diagnosis not present

## 2022-08-17 DIAGNOSIS — H0289 Other specified disorders of eyelid: Secondary | ICD-10-CM | POA: Diagnosis not present

## 2022-08-17 DIAGNOSIS — H10811 Pingueculitis, right eye: Secondary | ICD-10-CM | POA: Diagnosis not present

## 2022-08-17 DIAGNOSIS — H04123 Dry eye syndrome of bilateral lacrimal glands: Secondary | ICD-10-CM | POA: Diagnosis not present

## 2022-09-08 DIAGNOSIS — H04123 Dry eye syndrome of bilateral lacrimal glands: Secondary | ICD-10-CM | POA: Diagnosis not present

## 2022-09-08 DIAGNOSIS — H2513 Age-related nuclear cataract, bilateral: Secondary | ICD-10-CM | POA: Diagnosis not present

## 2022-09-08 DIAGNOSIS — H0289 Other specified disorders of eyelid: Secondary | ICD-10-CM | POA: Diagnosis not present

## 2022-10-13 DIAGNOSIS — H8110 Benign paroxysmal vertigo, unspecified ear: Secondary | ICD-10-CM | POA: Diagnosis not present

## 2022-11-21 DIAGNOSIS — R351 Nocturia: Secondary | ICD-10-CM | POA: Diagnosis not present

## 2022-11-21 DIAGNOSIS — N401 Enlarged prostate with lower urinary tract symptoms: Secondary | ICD-10-CM | POA: Diagnosis not present

## 2022-11-24 DIAGNOSIS — L299 Pruritus, unspecified: Secondary | ICD-10-CM | POA: Diagnosis not present

## 2022-11-24 DIAGNOSIS — R7303 Prediabetes: Secondary | ICD-10-CM | POA: Diagnosis not present

## 2022-11-24 DIAGNOSIS — E782 Mixed hyperlipidemia: Secondary | ICD-10-CM | POA: Diagnosis not present

## 2022-11-24 DIAGNOSIS — N182 Chronic kidney disease, stage 2 (mild): Secondary | ICD-10-CM | POA: Diagnosis not present

## 2022-11-24 DIAGNOSIS — M791 Myalgia, unspecified site: Secondary | ICD-10-CM | POA: Diagnosis not present

## 2022-11-24 DIAGNOSIS — J432 Centrilobular emphysema: Secondary | ICD-10-CM | POA: Diagnosis not present

## 2022-11-24 DIAGNOSIS — R109 Unspecified abdominal pain: Secondary | ICD-10-CM | POA: Diagnosis not present

## 2022-11-28 ENCOUNTER — Ambulatory Visit: Payer: Medicare HMO | Attending: Cardiology

## 2022-11-28 ENCOUNTER — Encounter: Payer: Self-pay | Admitting: *Deleted

## 2022-11-28 DIAGNOSIS — I493 Ventricular premature depolarization: Secondary | ICD-10-CM

## 2022-11-28 NOTE — Progress Notes (Unsigned)
Enrolled for [employer] to mail a ZIO XT long term holter monitor to the patients address on file.   Letter with instructions mailed to patient.  ZIO XT serial # [id number] mailed to patient and applied in office.

## 2022-12-01 DIAGNOSIS — I700 Atherosclerosis of aorta: Secondary | ICD-10-CM | POA: Diagnosis not present

## 2022-12-01 DIAGNOSIS — J432 Centrilobular emphysema: Secondary | ICD-10-CM | POA: Diagnosis not present

## 2022-12-01 DIAGNOSIS — F17200 Nicotine dependence, unspecified, uncomplicated: Secondary | ICD-10-CM | POA: Diagnosis not present

## 2022-12-01 DIAGNOSIS — N182 Chronic kidney disease, stage 2 (mild): Secondary | ICD-10-CM | POA: Diagnosis not present

## 2022-12-01 DIAGNOSIS — N401 Enlarged prostate with lower urinary tract symptoms: Secondary | ICD-10-CM | POA: Diagnosis not present

## 2022-12-01 DIAGNOSIS — Z0000 Encounter for general adult medical examination without abnormal findings: Secondary | ICD-10-CM | POA: Diagnosis not present

## 2022-12-01 DIAGNOSIS — R5383 Other fatigue: Secondary | ICD-10-CM | POA: Diagnosis not present

## 2022-12-01 DIAGNOSIS — I10 Essential (primary) hypertension: Secondary | ICD-10-CM | POA: Diagnosis not present

## 2022-12-01 DIAGNOSIS — E782 Mixed hyperlipidemia: Secondary | ICD-10-CM | POA: Diagnosis not present

## 2022-12-01 DIAGNOSIS — R7303 Prediabetes: Secondary | ICD-10-CM | POA: Diagnosis not present

## 2022-12-01 DIAGNOSIS — Z23 Encounter for immunization: Secondary | ICD-10-CM | POA: Diagnosis not present

## 2022-12-01 LAB — BASIC METABOLIC PANEL WITH GFR
BUN: 11 (ref 4–21)
CO2: 24 — AB (ref 13–22)
Chloride: 108 (ref 99–108)
Creatinine: 1 (ref 0.6–1.3)
Creatinine: 1.1 (ref 0.6–1.3)
Glucose: 109
Potassium: 4 meq/L (ref 3.5–5.1)
Sodium: 144 (ref 137–147)

## 2022-12-01 LAB — PROTEIN / CREATININE RATIO, URINE
Albumin, U: 9.7
Creatinine, Urine: 82.9

## 2022-12-01 LAB — COMPREHENSIVE METABOLIC PANEL WITH GFR
Albumin: 3.9 (ref 3.5–5.0)
Calcium: 8.8 (ref 8.7–10.7)
eGFR: 80

## 2022-12-01 LAB — HEPATIC FUNCTION PANEL
ALT: 39 U/L (ref 10–40)
AST: 30 (ref 14–40)
Alkaline Phosphatase: 72 (ref 25–125)
Bilirubin, Total: 0.8

## 2022-12-01 LAB — LIPID PANEL
Cholesterol: 117 (ref 0–200)
HDL: 70 (ref 35–70)
LDL Cholesterol: 30
LDl/HDL Ratio: 0.4
Triglycerides: 88 (ref 40–160)

## 2022-12-01 LAB — TSH: TSH: 2.1 (ref 0.41–5.90)

## 2022-12-01 LAB — MICROALBUMIN / CREATININE URINE RATIO
Microalb Creat Ratio: 12
Microalb Creat Ratio: 15

## 2022-12-05 ENCOUNTER — Ambulatory Visit: Payer: Medicare HMO | Attending: Cardiology

## 2022-12-05 DIAGNOSIS — I493 Ventricular premature depolarization: Secondary | ICD-10-CM | POA: Diagnosis not present

## 2022-12-11 DIAGNOSIS — I493 Ventricular premature depolarization: Secondary | ICD-10-CM | POA: Diagnosis not present

## 2022-12-12 DIAGNOSIS — N200 Calculus of kidney: Secondary | ICD-10-CM | POA: Diagnosis not present

## 2022-12-12 DIAGNOSIS — R7303 Prediabetes: Secondary | ICD-10-CM | POA: Diagnosis not present

## 2022-12-12 DIAGNOSIS — I700 Atherosclerosis of aorta: Secondary | ICD-10-CM | POA: Diagnosis not present

## 2022-12-12 DIAGNOSIS — N401 Enlarged prostate with lower urinary tract symptoms: Secondary | ICD-10-CM | POA: Diagnosis not present

## 2022-12-12 DIAGNOSIS — I10 Essential (primary) hypertension: Secondary | ICD-10-CM | POA: Diagnosis not present

## 2022-12-12 DIAGNOSIS — E782 Mixed hyperlipidemia: Secondary | ICD-10-CM | POA: Diagnosis not present

## 2022-12-12 DIAGNOSIS — N182 Chronic kidney disease, stage 2 (mild): Secondary | ICD-10-CM | POA: Diagnosis not present

## 2022-12-12 DIAGNOSIS — J432 Centrilobular emphysema: Secondary | ICD-10-CM | POA: Diagnosis not present

## 2022-12-12 DIAGNOSIS — F17200 Nicotine dependence, unspecified, uncomplicated: Secondary | ICD-10-CM | POA: Diagnosis not present

## 2022-12-12 DIAGNOSIS — Z0000 Encounter for general adult medical examination without abnormal findings: Secondary | ICD-10-CM | POA: Diagnosis not present

## 2022-12-12 LAB — MICROALBUMIN / CREATININE URINE RATIO: Microalb Creat Ratio: 14

## 2022-12-12 LAB — PROTEIN / CREATININE RATIO, URINE
Albumin, U: 16.4
Creatinine, Urine: 113.8

## 2022-12-23 ENCOUNTER — Telehealth: Payer: Self-pay | Admitting: *Deleted

## 2022-12-23 DIAGNOSIS — I493 Ventricular premature depolarization: Secondary | ICD-10-CM

## 2022-12-23 NOTE — Telephone Encounter (Signed)
-----   Message from [facility] sent at 12/22/2022  5:55 PM EDT ----- Spoke to the patient over the phone. Clinically doing okay. Currently on Cardizem. He is agreeable to see electrophysiology for PVC management.  Please place a referral and arrange consultation. You can move the follow-up visit with myself after the consult.  Sunit Fruit[place], DO, [facility]

## 2022-12-26 DIAGNOSIS — I493 Ventricular premature depolarization: Secondary | ICD-10-CM

## 2023-01-04 ENCOUNTER — Encounter: Payer: Self-pay | Admitting: Internal Medicine

## 2023-01-04 ENCOUNTER — Ambulatory Visit: Payer: Medicare HMO | Attending: Internal Medicine | Admitting: Internal Medicine

## 2023-01-04 VITALS — BP 140/86 | HR 68 | Ht 68.0 in | Wt 146.8 lb

## 2023-01-04 DIAGNOSIS — I493 Ventricular premature depolarization: Secondary | ICD-10-CM

## 2023-01-04 NOTE — Progress Notes (Signed)
HPI [name] is referred for evaluation of PVC's. He is a pleasant 82 yo man who has a h/o dyslipidemia and HTN. He was found to have PVC's with a burden from 9 to 15% obtained from multiple cardiac monitors. He does not have palpitations. He notes that his energy level is not as good as it was. He had been on a beta blocker and calcium blocker but the beta blocker was stopped. He has not had syncope.  No Known Allergies   Current Outpatient Medications  Medication Sig Dispense Refill   atorvastatin (LIPITOR) 20 MG tablet Take 20 mg by mouth at bedtime.      diltiazem (CARDIZEM CD) 360 MG 24 hr capsule Take 360 mg by mouth daily.     lisinopril (PRINIVIL,ZESTRIL) 40 MG tablet Take 40 mg by mouth 2 (two) times daily.     Multiple Vitamins-Minerals (MENS 50+ MULTI VITAMIN/MIN PO) Take by mouth daily.     Ascorbic Acid (VITAMIN C PO) Take 1 tablet by mouth daily. (Patient not taking: Reported on 01/04/2023)     metoprolol tartrate (LOPRESSOR) 25 MG tablet Take 1 tablet by mouth twice daily (Patient not taking: Reported on 01/04/2023) 180 tablet 1   No current facility-administered medications for this visit.     Past Medical History:  Diagnosis Date   Deafness in right ear    High cholesterol    Hyperlipemia 10/30/2018   Hypertension    Kidney stones    Kidney stones     ROS:   All systems reviewed and negative except as noted in the HPI.   Past Surgical History:  Procedure Laterality Date   APPENDECTOMY     CHOLECYSTECTOMY N/A 01/29/2018   Procedure: LAPAROSCOPIC CHOLECYSTECTOMY WITH INTRAOPERATIVE CHOLANGIOGRAM;  Surgeon: Manus Rudd, MD;  Location: [facility] OR;  Service: General;  Laterality: N/A;   CYSTOSCOPY     DENTAL SURGERY     ERCP N/A 01/30/2018   Procedure: ENDOSCOPIC RETROGRADE CHOLANGIOPANCREATOGRAPHY (ERCP);  Surgeon: Willis Modena, MD;  Location[facility]COPY;  Service: Endoscopy;  Laterality: N/A;   INGUINAL HERNIA REPAIR Left 03/22/2018   Procedure:  LEFT INGUINAL HERNIA REPAIR ERAS PATHWAY;  Surgeon: Manus Rudd, MD;  Location: [facility];  Service: General;  Laterality: Left;   INSERTION OF MESH Left 03/22/2018   Procedure: INSERTION OF MESH;  Surgeon: Manus Rudd, MD;  Location: [facility];  Service: General;  Laterality: Left;   REMOVAL OF STONES  01/30/2018   Procedure: REMOVAL OF STONES;  Surgeon: Willis Modena, MD;  Location: [facility] ENDOSCOPY;  Service: Endoscopy;;   SPHINCTEROTOMY  01/30/2018   Procedure: Dennison Mascot;  Surgeon: Willis Modena, MD;  Location: [facility] ENDOSCOPY;  Service: Endoscopy;;     History reviewed. No pertinent family history.   Social History   Socioeconomic History   Marital status: Married    Spouse name: Not on file   Number of children: 1   Years of education: Not on file   Highest education level: Not on file  Occupational History   Not on file  Tobacco Use   Smoking status: Every Day    Current packs/day: 0.50    Average packs/day: 0.5 packs/day for 35.0 years (17.5 ttl pk-yrs)    Types: Cigarettes   Smokeless tobacco: Never  Vaping Use   Vaping status: Never Used  Substance and Sexual Activity   Alcohol use: Not Currently    Comment: occ   Drug use: No   Sexual activity:  Not on file  Other Topics Concern   Not on file  Social History Narrative   Not on file   Social Determinants of Health   Financial Resource Strain: Not on file  Food Insecurity: Not on file  Transportation Needs: Not on file  Physical Activity: Not on file  Stress: Not on file  Social Connections: Not on file  Intimate Partner Violence: Not on file     BP (!) 140/86   Pulse 68   Ht 5\' 8"  (1.727 m)   Wt 146 lb 12.8 oz (66.6 kg)   SpO2 94%   BMI 22.32 kg/m   Physical Exam:  Well appearing 82 yo man, NAD HEENT: Unremarkable Neck:  No JVD, no thyromegally Lymphatics:  No adenopathy Back:  No CVA tenderness Lungs:  Clear with no wheezes HEART:  Regular rate  rhythm, no murmurs, no rubs, no clicks Abd:  soft, positive bowel sounds, no organomegally, no rebound, no guarding Ext:  2 plus pulses, no edema, no cyanosis, no clubbing Skin:  No rashes no nodules Neuro:  CN II through XII intact, motor grossly intact  EKG - nsr  Assess/Plan: PVC's - as he is asymptomatic and does not feel palpitations and has less than 20% PVC, I would not recommend any additional treatment. If he develops symptoms might consider an AA drug.  HTN - his bp is fairly well controlled. We will follow.  Sharlot Gowda Donley Harland,MD

## 2023-01-04 NOTE — Patient Instructions (Addendum)
 Medication Instructions:  Your physician recommends that you continue on your current medications as directed. Please refer to the Current Medication list given to you today.  *If you need a refill on your cardiac medications before your next appointment, please call your pharmacy*  Lab Work: None ordered.  If you have labs (blood work) drawn today and your tests are completely normal, you will receive your results only by: MyChart Message (if you have MyChart) OR A paper copy in the mail If you have any lab test that is abnormal or we need to change your treatment, we will call you to review the results.  Testing/Procedures: None ordered.  Follow-Up: At [facility], you and your health needs are our priority.  As part of our continuing mission to provide you with exceptional heart care, we have created designated Provider Care Teams.  These Care Teams include your primary Cardiologist (physician) and Advanced Practice Providers (APPs -  Physician Assistants and Nurse Practitioners) who all work together to provide you with the care you need, when you need it.   Your next appointment:   As needed.  The format for your next appointment:   In Person  Provider:   Lewayne Bunting, MD{or one of the following Advanced Practice Providers on your designated Care Team:   Francis Dowse, [place] Casimiro Needle "Mardelle Matte" [place], [place] Earnest Rosier, NP   Important Information About Sugar

## 2023-01-19 ENCOUNTER — Ambulatory Visit: Payer: Self-pay | Admitting: Cardiology

## 2023-01-20 ENCOUNTER — Encounter: Payer: Self-pay | Admitting: Cardiology

## 2023-01-20 ENCOUNTER — Ambulatory Visit: Payer: Medicare HMO | Attending: Cardiology | Admitting: Cardiology

## 2023-01-20 VITALS — BP 116/78 | HR 66 | Ht 68.0 in | Wt 145.4 lb

## 2023-01-20 DIAGNOSIS — E782 Mixed hyperlipidemia: Secondary | ICD-10-CM | POA: Diagnosis not present

## 2023-01-20 DIAGNOSIS — I493 Ventricular premature depolarization: Secondary | ICD-10-CM

## 2023-01-20 DIAGNOSIS — I10 Essential (primary) hypertension: Secondary | ICD-10-CM | POA: Diagnosis not present

## 2023-01-20 DIAGNOSIS — F17210 Nicotine dependence, cigarettes, uncomplicated: Secondary | ICD-10-CM | POA: Diagnosis not present

## 2023-01-20 NOTE — Progress Notes (Signed)
Cardiology Office Note:  .   Date:  01/20/2023  ID:  William Black, DOB November 16, 1940, MRN [id number] PCP:  Georgianne Fick, MD  Former Cardiology Providers: [place], APRN, FNP-C  [facility] Providers Cardiologist:  Tessa Lerner, DO , [facility] (established care June 2021) Electrophysiologist:  None  Click to update primary MD,subspecialty MD or APP then REFRESH:1}    Chief Complaint  Patient presents with   Follow-up    6 month follow up     History of Present Illness: .   William Black is a 82 y.o. Caucasian male whose past medical history and cardiovascular risk factors includes: hypertension, hyperlipidemia, current cigarette smoking, PVC, advance age, aortic atherosclerosis.   Patient is being followed by the practice given his PVC burden.  His PVC burden has been as high as 15%.  In the past he wanted to hold off seeing electrophysiology for management of PVCs.  His PVCs is multifactorial.  However since last office visit he was referred to EP and patient was seen by Dr. Ladona Ridgel.  No additional changes are recommended at this time.  He presents today for 13-month follow-up visit.  Since last office visit he did undergo hydrocele procedure in May 2024 without any postprocedural complications from a cardiac standpoint.  He did also follow-up with Dr. Leanne Lovely progress note reviewed.  Clinically denies anginal chest pain or heart failure symptoms.  He denies any change in physical endurance, near-syncope or syncopal events.  Unfortunately continues to smoke 0.5 packs/day.  He is accompanied by his wife at today's office visit  Review of Systems: .   Review of Systems  Cardiovascular:  Negative for chest pain, claudication, irregular heartbeat, leg swelling, near-syncope, orthopnea, palpitations, paroxysmal nocturnal dyspnea and syncope.  Respiratory:  Negative for shortness of breath.   Hematologic/Lymphatic: Negative for bleeding problem.    Studies  Reviewed:   EKG: 05/18/2022: Sinus bradycardia, 53 bpm, LAE, consider old anteroseptal infarct.  When compared to prior EKG 07/15/2021 PVCs no longer present..   Echocardiogram: 05/04/2020:  Left ventricle cavity is normal in size and wall thickness. Normal global wall motion. Normal LV systolic function with EF 55%. Normal diastolic filling pattern.  Mild (Grade I) mitral regurgitation.  Mild tricuspid regurgitation. Estimated pulmonary artery systolic pressure 28 mmHg.   Stress Testing:  Lexiscan Tetrofosmin Stress Test  01/21/2019: Nondiagnostic ECG stress. Normal left ventricular systolic function. Normal perfusion. Stress LV EF: 62%. Low risk study.   24 hour Holter monitor: 08/27/2019: PVC burden 15.1%.  See report for additional details   14 day extended Holter monitor: 01/05/2021: PVC burden 8.8%, see report for additional details  Cardiac monitor (Zio Patch): 12/05/2022 - 12/08/2022 Dominant rhythm sinus rhythm, followed by bradycardia (burden 30%). Heart rate 38-162 bpm. Avg HR 60 bpm. Minimum heart rate: Sinus bradycardia, 38 bpm, on 12/06/2022 at 2:04 AM. No atrial fibrillation detected during the monitoring period. No supraventricular tachycardia, high grade AV block, pauses (3 seconds or longer). Total supraventricular ectopic burden <1%. Total ventricular ectopic burden 10.6%. 3 asymptomatic episodes of NSVT. Fastest episode occurred on 12/07/2022 at 2:20 PM, 6 beats, 2.9 seconds duration, max HR 162 bpm. Patient triggered events: 0.   RADIOLOGY: N/A  Risk Assessment/Calculations:   N/A   Labs:       Latest Ref Rng & Units 01/31/2018    2:10 AM 01/30/2018    3:18 AM 01/29/2018    1:30 AM  CBC  WBC 4.0 - 10.5 K/uL 14.6  17.0  15.6  Hemoglobin 13.0 - 17.0 g/dL 74.2  59.5  63.8   Hematocrit 39.0 - 52.0 % 40.2  42.2  46.5   Platelets 150 - 400 K/uL 98  107  129        Latest Ref Rng & Units 01/31/2018    2:10 AM 01/30/2018    3:18 AM 01/29/2018    1:30  AM  BMP  Glucose 70 - 99 mg/dL 756  433  295   BUN 8 - 23 mg/dL 9  9  9    Creatinine 0.61 - 1.24 mg/dL 1.88  4.16  6.06   Sodium 135 - 145 mmol/L 137  138  137   Potassium 3.5 - 5.1 mmol/L 3.8  3.8  3.7   Chloride 98 - 111 mmol/L 106  106  106   CO2 22 - 32 mmol/L 25  24  23    Calcium 8.9 - 10.3 mg/dL 8.5  8.5  8.9       Latest Ref Rng & Units 01/31/2018    2:10 AM 01/30/2018    3:18 AM 01/29/2018    1:30 AM  CMP  Glucose 70 - 99 mg/dL 301  601  093   BUN 8 - 23 mg/dL 9  9  9    Creatinine 0.61 - 1.24 mg/dL 2.35  5.73  2.20   Sodium 135 - 145 mmol/L 137  138  137   Potassium 3.5 - 5.1 mmol/L 3.8  3.8  3.7   Chloride 98 - 111 mmol/L 106  106  106   CO2 22 - 32 mmol/L 25  24  23    Calcium 8.9 - 10.3 mg/dL 8.5  8.5  8.9   Total Protein 6.5 - 8.1 g/dL 5.5  5.5  6.4   Total Bilirubin 0.3 - 1.2 mg/dL 0.7  0.7  0.8   Alkaline Phos 38 - 126 U/L 46  45  55   AST 15 - 41 U/L 36  50  28   ALT 0 - 44 U/L 45  53  28     No results found for: "CHOL", "HDL", "LDLCALC", "LDLDIRECT", "TRIG", "CHOLHDL" No results for input(s): "LIPOA" in the last 8760 hours. No components found for: "NTPROBNP" No results for input(s): "PROBNP" in the last 8760 hours. No results for input(s): "TSH" in the last 8760 hours.  Physical Exam:    Today's Vitals   01/20/23 0930  BP: 116/78  Pulse: 66  SpO2: 95%  Weight: 145 lb 6.4 oz (66 kg)  Height: 5\' 8"  (1.727 m)   Body mass index is 22.11 kg/m. Wt Readings from Last 3 Encounters:  01/20/23 145 lb 6.4 oz (66 kg)  01/04/23 146 lb 12.8 oz (66.6 kg)  07/18/22 144 lb 6.4 oz (65.5 kg)    Physical Exam  Constitutional: No distress.  Age appropriate, hemodynamically stable.   Neck: No JVD present.  Cardiovascular: Normal rate, regular rhythm, S1 normal, S2 normal, intact distal pulses and normal pulses. Exam reveals no gallop, no S3 and no S4.  No murmur heard. Pulmonary/Chest: Effort normal and breath sounds normal. No stridor. He has no wheezes. He  has no rales.  Abdominal: Soft. Bowel sounds are normal. He exhibits no distension. There is no abdominal tenderness.  Musculoskeletal:        General: No edema.     Cervical back: Neck supple.  Neurological: He is alert and oriented to person, place, and time. He has intact cranial nerves (2-12).  Skin: Skin is warm and  moist.     Impression & Recommendation(s):  Impression:   ICD-10-CM   1. PVC (premature ventricular contraction)  I49.3 CANCELED: EKG 12-Lead    2. Essential hypertension  I10     3. Mixed hyperlipidemia  E78.2     4. Cigarette smoker  F17.210        Recommendation(s):  PVC (premature ventricular contraction) Initially noted to have a PVC burden of approximately 15.1% back in 2021. Has been placed on medical therapy and despite that repeat Zio patch notes a PVC burden of approximately 10%. He was referred to EP for further evaluation-consideration of ablation and/or antiarrhythmic medications. Shared decision was to continue current medical therapies as he was asymptomatic this time Personally reviewed the notes from Dr. Ladona Ridgel since last office encounter. Currently on Cardizem 360 mg p.o. daily  Essential hypertension Office blood pressures are very well-controlled. Continue lisinopril 40 mg p.o. daily  Mixed hyperlipidemia Aortic atherosclerosis Currently on Lipitor 20 mg p.o. nightly. No recent labs available for review-I did check care everywhere but no records available. Recommended fasting lipids to be forwarded to [place] for reference.  Cigarette smoker Smokes at least 0.5 packs/day. Tobacco cessation counseling: Patient denies claudication. Would benefit from regular lung cancer screening-will defer to PCP. Consider screening for peripheral artery disease with ankle-brachial index (if not done so already)-defer to PCP.  He is informed of the dangers of tobacco abuse including stroke, cancer, and MI, as well as benefits of tobacco cessation. He is  not willing to quit at this time. 5 mins were spent counseling patient cessation techniques. We discussed various methods to help quit smoking, including deciding on a date to quit, joining a support group, pharmacological agents- nicotine gum/patch/lozenges.  I will reassess his progress at the next follow-up visit  Orders Placed:  No orders of the defined types were placed in this encounter.  Final Medication List:   No orders of the defined types were placed in this encounter.   Medications Discontinued During This Encounter  Medication Reason   metoprolol tartrate (LOPRESSOR) 25 MG tablet    Ascorbic Acid (VITAMIN C PO)      Current Outpatient Medications:    atorvastatin (LIPITOR) 20 MG tablet, Take 20 mg by mouth at bedtime. , Disp: , Rfl:    diltiazem (CARDIZEM CD) 360 MG 24 hr capsule, Take 360 mg by mouth daily., Disp: , Rfl:    lisinopril (PRINIVIL,ZESTRIL) 40 MG tablet, Take 40 mg by mouth 2 (two) times daily., Disp: , Rfl:    Multiple Vitamins-Minerals (MENS 50+ MULTI VITAMIN/MIN PO), Take by mouth daily., Disp: , Rfl:   Consent:   N/A  Disposition:   1 year sooner if needed. Patient may be asked to follow-up sooner based on the results of the above-mentioned testing.  His questions and concerns were addressed to his satisfaction. He voices understanding of the recommendations provided during this encounter.    Signed, Tessa Lerner, DO, [facility]  [facility]  [address] [place], [place] [address] 01/20/2023 11:52 AM

## 2023-01-20 NOTE — Patient Instructions (Signed)

## 2023-06-01 ENCOUNTER — Ambulatory Visit: Payer: HMO | Admitting: Nurse Practitioner

## 2023-06-01 ENCOUNTER — Encounter: Payer: Self-pay | Admitting: Nurse Practitioner

## 2023-06-01 VITALS — BP 132/88 | HR 35 | Temp 97.9°F | Ht 68.0 in | Wt 145.2 lb

## 2023-06-01 DIAGNOSIS — I10 Essential (primary) hypertension: Secondary | ICD-10-CM

## 2023-06-01 DIAGNOSIS — I499 Cardiac arrhythmia, unspecified: Secondary | ICD-10-CM | POA: Diagnosis not present

## 2023-06-01 DIAGNOSIS — E785 Hyperlipidemia, unspecified: Secondary | ICD-10-CM | POA: Diagnosis not present

## 2023-06-01 LAB — COMPREHENSIVE METABOLIC PANEL
ALT: 19 U/L (ref 0–53)
AST: 23 U/L (ref 0–37)
Albumin: 4.4 g/dL (ref 3.5–5.2)
Alkaline Phosphatase: 62 U/L (ref 39–117)
BUN: 14 mg/dL (ref 6–23)
CO2: 24 meq/L (ref 19–32)
Calcium: 9.4 mg/dL (ref 8.4–10.5)
Chloride: 106 meq/L (ref 96–112)
Creatinine, Ser: 0.99 mg/dL (ref 0.40–1.50)
GFR: 71.99 mL/min (ref >60.00)
Glucose, Bld: 104 mg/dL — ABNORMAL HIGH (ref 70–99)
Potassium: 4.3 meq/L (ref 3.5–5.1)
Sodium: 140 meq/L (ref 135–145)
Total Bilirubin: 0.9 mg/dL (ref 0.2–1.2)
Total Protein: 6.9 g/dL (ref 6.0–8.3)

## 2023-06-01 LAB — LIPID PANEL
Cholesterol: 100 mg/dL (ref 0–200)
HDL: 52.4 mg/dL (ref >39.00)
LDL Cholesterol: 31 mg/dL (ref 0–99)
NonHDL: 47.34
Total CHOL/HDL Ratio: 2
Triglycerides: 80 mg/dL (ref 0.0–149.0)
VLDL: 16 mg/dL (ref 0.0–40.0)

## 2023-06-01 LAB — CBC
HCT: 47.5 % (ref 39.0–52.0)
Hemoglobin: 16.2 g/dL (ref 13.0–17.0)
MCHC: 34.2 g/dL (ref 30.0–36.0)
MCV: 99 fl (ref 78.0–100.0)
Platelets: 139 10*3/uL — ABNORMAL LOW (ref 150.0–400.0)
RBC: 4.8 Mil/uL (ref 4.22–5.81)
RDW: 14 % (ref 11.5–15.5)
WBC: 6.7 10*3/uL (ref 4.0–10.5)

## 2023-06-01 LAB — TSH: TSH: 1.45 u[IU]/mL (ref 0.35–5.50)

## 2023-06-01 NOTE — Assessment & Plan Note (Signed)
 Patient currently maintained on Cardizem 360 mg daily.  Along with lisinopril 40 mg daily.  Blood pressure well-controlled.  Patient not having any lightheadedness or dizziness.  Continue medication as prescribed.  Patient is followed by cardiology

## 2023-06-01 NOTE — Patient Instructions (Signed)
 Nice to see you today I will be in touch with the labs  Follow up with me in 6 months, sooner if you need me

## 2023-06-01 NOTE — Progress Notes (Signed)
 New Patient Office Visit  Subjective    Patient ID: William Black, male    DOB: 04/30/1940  Age: 83 y.o. MRN: [id number]  CC: No chief complaint on file.   HPI William Black presents to establish care  HTN: does not check blood pressure in the past.  Patient currently maintained on Cardizem 360 mg and lisinopril 40 mg daily.  States that he has been to cardiology and done 3 heart monitor and saw EP. He is followed by Dr Bertell Maria  HLD: he is on atrovastatin milligram  Colonoscopy: states that it was within 2 years. No follow up  PSA: Patient is followed by Dr. Marlou Porch states up-to-date with urology  Tdap 2018 Flu:up to date Covid Pna: up date Shingles: up to date RSV: Up-to-date   Advanced directive : has one   Outpatient Encounter Medications as of 06/01/2023  Medication Sig   atorvastatin (LIPITOR) 20 MG tablet Take 20 mg by mouth at bedtime.    diltiazem (CARDIZEM CD) 360 MG 24 hr capsule Take 360 mg by mouth daily.   lisinopril (PRINIVIL,ZESTRIL) 40 MG tablet Take 40 mg by mouth 2 (two) times daily.   Multiple Vitamins-Minerals (MENS 50+ MULTI VITAMIN/MIN PO) Take by mouth daily.   No facility-administered encounter medications on file as of 06/01/2023.    Past Medical History:  Diagnosis Date   Deafness in right ear    High cholesterol    Hyperlipemia 10/30/2018   Hypertension    Kidney stones    Kidney stones     Past Surgical History:  Procedure Laterality Date   APPENDECTOMY     CHOLECYSTECTOMY N/A 01/29/2018   Procedure: LAPAROSCOPIC CHOLECYSTECTOMY WITH INTRAOPERATIVE CHOLANGIOGRAM;  Surgeon: Manus Rudd, MD;  Location: [facility] OR;  Service: General;  Laterality: N/A;   CYST EXCISION     testicular   CYSTOSCOPY     DENTAL SURGERY     ERCP N/A 01/30/2018   Procedure: ENDOSCOPIC RETROGRADE CHOLANGIOPANCREATOGRAPHY (ERCP);  Surgeon: Willis Modena, MD;  [facility] ENDOSCOPY;  Service: Endoscopy;  Laterality: N/A;   INGUINAL HERNIA REPAIR Left  03/22/2018   Procedure: LEFT INGUINAL HERNIA REPAIR ERAS PATHWAY;  Surgeon: Manus Rudd, MD;  Location: [facility];  Service: General;  Laterality: Left;   INSERTION OF MESH Left 03/22/2018   Procedure: INSERTION OF MESH;  Surgeon: Manus Rudd, MD;  Location: [facility];  Service: General;  Laterality: Left;   REMOVAL OF STONES  01/30/2018   Procedure: REMOVAL OF STONES;  Surgeon: Willis Modena, MD;  Location: [facility] ENDOSCOPY;  Service: Endoscopy;;   SPHINCTEROTOMY  01/30/2018   Procedure: Dennison Mascot;  Surgeon: Willis Modena, MD;  Location: [facility] ENDOSCOPY;  Service: Endoscopy;;    Family History  Problem Relation Age of Onset   Alcohol abuse Mother     Social History   Socioeconomic History   Marital status: Married    Spouse name: Not on file   Number of children: 1   Years of education: Not on file   Highest education level: Not on file  Occupational History   Not on file  Tobacco Use   Smoking status: Every Day    Current packs/day: 0.50    Average packs/day: 0.5 packs/day for 50.0 years (25.0 ttl pk-yrs)    Types: Cigarettes   Smokeless tobacco: Never  Vaping Use   Vaping status: Never Used  Substance and Sexual Activity   Alcohol use: Not Currently    Comment: occ   Drug use: No  Sexual activity: Not on file  Other Topics Concern   Not on file  Social History Narrative   Retired He use to work for [employer] (53)   Social Drivers of Corporate investment banker Strain: Not on [employer] Insecurity: Not on file  Transportation Needs: Not on file  Physical Activity: Not on file  Stress: Not on file  Social Connections: Not on file  Intimate Partner Violence: Not on file    Review of Systems  Constitutional:  Negative for chills and fever.  Respiratory:  Negative for shortness of breath.   Cardiovascular:  Negative for chest pain and leg swelling.  Gastrointestinal:  Negative for abdominal  pain, blood in stool, constipation, diarrhea, nausea and vomiting.       Bm daily   Genitourinary:  Negative for dysuria and hematuria.  Neurological:  Negative for tingling and headaches.  Psychiatric/Behavioral:  Negative for hallucinations and suicidal ideas.         Objective    BP 132/88   Pulse (!) 35   Temp 97.9 F (36.6 C) (Oral)   Ht 5\' 8"  (1.727 m)   Wt 145 lb 3.2 oz (65.9 kg)   SpO2 95%   BMI 22.08 kg/m   Physical Exam Vitals and nursing note reviewed.  Constitutional:      Appearance: Normal appearance.  HENT:     Right Ear: Tympanic membrane, ear canal and external ear normal.     Left Ear: Tympanic membrane, ear canal and external ear normal.     Mouth/Throat:     Mouth: Mucous membranes are moist.     Pharynx: Oropharynx is clear.  Eyes:     Extraocular Movements: Extraocular movements intact.     Pupils: Pupils are equal, round, and reactive to light.  Cardiovascular:     Rate and Rhythm: Bradycardia present. Rhythm irregular.     Pulses: Normal pulses.     Heart sounds: Normal heart sounds.  Pulmonary:     Effort: Pulmonary effort is normal.     Breath sounds: Normal breath sounds.  Abdominal:     General: Bowel sounds are normal. There is no distension.     Palpations: There is no mass.     Tenderness: There is no abdominal tenderness.     Hernia: No hernia is present.  Musculoskeletal:     Right lower leg: No edema.     Left lower leg: No edema.  Lymphadenopathy:     Cervical: No cervical adenopathy.  Skin:    General: Skin is warm.  Neurological:     General: No focal deficit present.     Mental Status: He is alert.     Deep Tendon Reflexes:     Reflex Scores:      Bicep reflexes are 2+ on the right side and 2+ on the left side.      Patellar reflexes are 2+ on the right side and 2+ on the left side.    Comments: Bilateral upper and lower extremity strength 5/5  Psychiatric:        Mood and Affect: Mood normal.        Behavior:  Behavior normal.        Thought Content: Thought content normal.        Judgment: Judgment normal.         Assessment & Plan:   Problem List Items Addressed This Visit  Cardiovascular and Mediastinum   HTN (hypertension) - Primary   Patient currently maintained on Cardizem 360 mg daily.  Along with lisinopril 40 mg daily.  Blood pressure well-controlled.  Patient not having any lightheadedness or dizziness.  Continue medication as prescribed.  Patient is followed by cardiology      Relevant Orders   CBC   Comprehensive metabolic panel   TSH     Other   Hyperlipemia   History of the same.  Patient currently maintained on atorvastatin.  Pending lipid panel      Relevant Orders   Lipid panel   Irregular heartbeat   History of PVCs even on Cardizem.  Patient is seen cardiology and electrophysiology.  Patient had a regular sounding heartbeat on auscultation EKG demonstrated normal sinus      Relevant Orders   TSH   EKG 12-Lead (Completed)    Return in about 6 months (around 12/02/2023) for BP recheck.   Audria Nine, NP

## 2023-06-01 NOTE — Assessment & Plan Note (Signed)
 History of PVCs even on Cardizem.  Patient is seen cardiology and electrophysiology.  Patient had a regular sounding heartbeat on auscultation EKG demonstrated normal sinus

## 2023-06-01 NOTE — Assessment & Plan Note (Signed)
 History of the same.  Patient currently maintained on atorvastatin.  Pending lipid panel

## 2023-06-02 ENCOUNTER — Encounter: Payer: Self-pay | Admitting: Nurse Practitioner

## 2023-06-09 ENCOUNTER — Telehealth: Payer: Self-pay | Admitting: Nurse Practitioner

## 2023-06-09 NOTE — Telephone Encounter (Signed)
 Patient dropped off document  medical release  , to be filled out by provider. Patient requested to send it back via Call Patient to pick up within 5-days. Document is located in providers tray at front office.Please advise at Mobile [phone] (mobile)

## 2023-06-09 NOTE — Telephone Encounter (Signed)
 Contacted pt to retrieve his previous PCP office information. Placed medical release form up front.

## 2023-06-12 ENCOUNTER — Other Ambulatory Visit: Payer: Self-pay | Admitting: Nurse Practitioner

## 2023-06-12 NOTE — Telephone Encounter (Signed)
 Copied from CRM [id number]. Topic: Clinical - Medication Refill >> Jun 12, 2023 10:49 AM Arley Phenix D wrote: Most Recent Primary Care Visit:  Provider: Eden Emms  Department: [facility]  Visit Type: NEW PT - OFFICE VISIT  Date: 06/01/2023  Medication: lisinopril (PRINIVIL,ZESTRIL) 40 MG tablet, diltiazem (CARDIZEM CD) 360 MG 24 hr capsule  Has the patient contacted their pharmacy? No (Agent: If no, request that the patient contact the pharmacy for the refill. If patient does not wish to contact the pharmacy document the reason why and proceed with request.) (Agent: If yes, when and what did the pharmacy advise?)  Is this the correct pharmacy for this prescription? Yes If no, delete pharmacy and type the correct one.  This is the patient's preferred pharmacy:  [facility] [address], [place] - [address] [place] [address] Phone: [phone] Fax: [phone]   Has the prescription been filled recently? No  Is the patient out of the medication? No has enough for this week  Has the patient been seen for an appointment in the last year OR does the patient have an upcoming appointment? Yes  Can we respond through MyChart? No  Agent: Please be advised that Rx refills may take up to 3 business days. We ask that you follow-up with your pharmacy.

## 2023-06-14 MED ORDER — LISINOPRIL 40 MG PO TABS: 40.0000 mg | ORAL_TABLET | Freq: Every day | ORAL | 1 refills | Status: DC

## 2023-06-14 MED ORDER — DILTIAZEM HCL ER COATED BEADS 360 MG PO CP24: 360.0000 mg | ORAL_CAPSULE | Freq: Every day | ORAL | 1 refills | Status: DC

## 2023-07-09 ENCOUNTER — Emergency Department (HOSPITAL_COMMUNITY)
Admission: EM | Admit: 2023-07-09 | Discharge: 2023-07-09 | Disposition: A | Discharge status: Home / Self Care | Attending: Emergency Medicine | Admitting: Emergency Medicine

## 2023-07-09 ENCOUNTER — Emergency Department (HOSPITAL_COMMUNITY)

## 2023-07-09 ENCOUNTER — Other Ambulatory Visit: Payer: Self-pay

## 2023-07-09 DIAGNOSIS — I10 Essential (primary) hypertension: Secondary | ICD-10-CM | POA: Diagnosis not present

## 2023-07-09 DIAGNOSIS — M79641 Pain in right hand: Secondary | ICD-10-CM | POA: Diagnosis not present

## 2023-07-09 DIAGNOSIS — S60452A Superficial foreign body of right middle finger, initial encounter: Secondary | ICD-10-CM | POA: Diagnosis not present

## 2023-07-09 DIAGNOSIS — Z79899 Other long term (current) drug therapy: Secondary | ICD-10-CM | POA: Insufficient documentation

## 2023-07-09 DIAGNOSIS — M7989 Other specified soft tissue disorders: Secondary | ICD-10-CM | POA: Diagnosis not present

## 2023-07-09 DIAGNOSIS — L299 Pruritus, unspecified: Secondary | ICD-10-CM | POA: Diagnosis not present

## 2023-07-09 LAB — COMPREHENSIVE METABOLIC PANEL WITH GFR
ALT: 24 U/L (ref 0–44)
AST: 26 U/L (ref 15–41)
Albumin: 3.9 g/dL (ref 3.5–5.0)
Alkaline Phosphatase: 50 U/L (ref 38–126)
Anion gap: 9 (ref 5–15)
BUN: 12 mg/dL (ref 8–23)
CO2: 22 mmol/L (ref 22–32)
Calcium: 9.1 mg/dL (ref 8.9–10.3)
Chloride: 106 mmol/L (ref 98–111)
Creatinine, Ser: 1.14 mg/dL (ref 0.61–1.24)
GFR, Estimated: >60 mL/min (ref >60)
Glucose, Bld: 126 mg/dL — ABNORMAL HIGH (ref 70–99)
Potassium: 4.2 mmol/L (ref 3.5–5.1)
Sodium: 137 mmol/L (ref 135–145)
Total Bilirubin: 0.9 mg/dL (ref 0.0–1.2)
Total Protein: 7 g/dL (ref 6.5–8.1)

## 2023-07-09 LAB — CBC WITH DIFFERENTIAL/PLATELET
Abs Immature Granulocytes: 0.03 10*3/uL (ref 0.00–0.07)
Basophils Absolute: 0.1 10*3/uL (ref 0.0–0.1)
Basophils Relative: 1 %
Eosinophils Absolute: 0.1 10*3/uL (ref 0.0–0.5)
Eosinophils Relative: 1 %
HCT: 48.2 % (ref 39.0–52.0)
Hemoglobin: 16.7 g/dL (ref 13.0–17.0)
Immature Granulocytes: 1 %
Lymphocytes Relative: 29 %
Lymphs Abs: 1.9 10*3/uL (ref 0.7–4.0)
MCH: 33.5 pg (ref 26.0–34.0)
MCHC: 34.6 g/dL (ref 30.0–36.0)
MCV: 96.8 fL (ref 80.0–100.0)
Monocytes Absolute: 0.7 10*3/uL (ref 0.1–1.0)
Monocytes Relative: 11 %
Neutro Abs: 3.6 10*3/uL (ref 1.7–7.7)
Neutrophils Relative %: 57 %
Platelets: 129 10*3/uL — ABNORMAL LOW (ref 150–400)
RBC: 4.98 MIL/uL (ref 4.22–5.81)
RDW: 13.6 % (ref 11.5–15.5)
WBC: 6.3 10*3/uL (ref 4.0–10.5)
nRBC: 0 % (ref 0.0–0.2)

## 2023-07-09 MED ORDER — FAMOTIDINE 20 MG PO TABS: 20.0000 mg | ORAL_TABLET | Freq: Two times a day (BID) | ORAL | 0 refills | Status: DC

## 2023-07-09 MED ORDER — DIPHENHYDRAMINE HCL 25 MG PO CAPS
25.0000 mg | ORAL_CAPSULE | Freq: Once | ORAL | Status: AC
  Administered 2023-07-09: 25 mg via ORAL
  Filled 2023-07-09: qty 1

## 2023-07-09 MED ORDER — PREDNISONE 10 MG PO TABS: 30.0000 mg | ORAL_TABLET | Freq: Every day | ORAL | 0 refills | Status: AC

## 2023-07-09 MED ORDER — DEXAMETHASONE SODIUM PHOSPHATE 10 MG/ML IJ SOLN
10.0000 mg | Freq: Once | INTRAMUSCULAR | Status: AC
  Administered 2023-07-09: 10 mg via INTRAMUSCULAR
  Filled 2023-07-09: qty 1

## 2023-07-09 MED ORDER — FAMOTIDINE 20 MG PO TABS
20.0000 mg | ORAL_TABLET | Freq: Once | ORAL | Status: AC
  Administered 2023-07-09: 20 mg via ORAL
  Filled 2023-07-09: qty 1

## 2023-07-09 MED ORDER — DIPHENHYDRAMINE HCL 25 MG PO TABS: 25.0000 mg | ORAL_TABLET | Freq: Four times a day (QID) | ORAL | 0 refills | Status: DC | PRN

## 2023-07-09 NOTE — Discharge Instructions (Addendum)
 There were prescriptions sent to your pharmacy.  Take as prescribed.  Your redness, swelling, and itchiness should improve.  If you have any worsening, please return to the emergency department.

## 2023-07-09 NOTE — ED Triage Notes (Signed)
 Pt states that on Friday he was doing some gardening and a tool struck his R hand. He was able to perform his tasks without issue, but thereafter he has redness, swelling, and itching to his R hand. No exposure to known allergens or insect bites per pt.

## 2023-07-09 NOTE — ED Provider Notes (Signed)
 [place] EMERGENCY DEPARTMENT AT [facility] Provider Note   CSN: [id number] Arrival date & time: 07/09/23  8295     History  Chief Complaint  Patient presents with   Hand Injury    William Black is a 83 y.o. male.  HPI Patient presents for right hand swelling and itching.  Medical history includes HLD, HTN, nephrolithiasis.  2 days ago, he was doing some yard work.  When he went to retrieve a heavy tool, it did strike him in the medial aspect of his right hand.  He had some mild associated pain at the time.  He went on to trim some bushes.  He is not aware of any exposures to allergens.  The following day, he developed redness, swelling, and pruritus to the medial aspect of his right hand.  He has been trying topical ointments without relief.  He describes only mild pain but does report severe itchiness, which has been persistent today.  For this reason, he presents to the ED.    Home Medications Prior to Admission medications   Medication Sig Start Date End Date Taking? Authorizing Provider  diphenhydrAMINE  (BENADRYL ) 25 MG tablet Take 1 tablet (25 mg total) by mouth every 6 (six) hours as needed for up to 3 days. 07/09/23 07/12/23 Yes Iva Mariner, MD  famotidine  (PEPCID ) 20 MG tablet Take 1 tablet (20 mg total) by mouth 2 (two) times daily for 3 days. 07/09/23 07/12/23 Yes Iva Mariner, MD  predniSONE (DELTASONE) 10 MG tablet Take 3 tablets (30 mg total) by mouth daily for 3 days. 07/09/23 07/12/23 Yes Iva Mariner, MD  atorvastatin  (LIPITOR) 20 MG tablet Take 20 mg by mouth at bedtime.  02/15/17   [provider]  diltiazem  (CARDIZEM  CD) 360 MG 24 hr capsule Take 1 capsule (360 mg total) by mouth daily. 06/14/23   Dorothe Gaster, NP  lisinopril  (ZESTRIL ) 40 MG tablet Take 1 tablet (40 mg total) by mouth daily. 06/14/23   Dorothe Gaster, NP  Multiple Vitamins-Minerals (MENS 50+ MULTI VITAMIN/MIN PO) Take by mouth daily.    [provider]      Allergies     Patient has no known allergies.    Review of Systems   Review of Systems  Skin:  Positive for rash.  All other systems reviewed and are negative.   Physical Exam Updated Vital Signs BP (!) 150/67   Pulse 67   Temp 98 F (36.7 C) (Oral)   Resp 18   SpO2 99%  Physical Exam Vitals and nursing note reviewed.  Constitutional:      General: He is not in acute distress.    Appearance: Normal appearance. He is well-developed. He is not ill-appearing, toxic-appearing or diaphoretic.  HENT:     Head: Normocephalic and atraumatic.     Right Ear: External ear normal.     Left Ear: External ear normal.     Nose: Nose normal.     Mouth/Throat:     Mouth: Mucous membranes are moist.  Eyes:     Extraocular Movements: Extraocular movements intact.     Conjunctiva/sclera: Conjunctivae normal.  Cardiovascular:     Rate and Rhythm: Normal rate and regular rhythm.  Pulmonary:     Effort: Pulmonary effort is normal. No respiratory distress.  Abdominal:     General: There is no distension.     Palpations: Abdomen is soft.     Tenderness: There is no abdominal tenderness.  Musculoskeletal:  General: Swelling present.     Cervical back: Normal range of motion and neck supple.     Right lower leg: No edema.     Left lower leg: No edema.  Skin:    General: Skin is warm and dry.     Findings: Erythema present.  Neurological:     General: No focal deficit present.     Mental Status: He is alert and oriented to person, place, and time.  Psychiatric:        Mood and Affect: Mood normal.        Behavior: Behavior normal.     ED Results / Procedures / Treatments   Labs (all labs ordered are listed, but only abnormal results are displayed) Labs Reviewed  CBC WITH DIFFERENTIAL/PLATELET - Abnormal; Notable for the following components:      Result Value   Platelets 129 (*)    All other components within normal limits  COMPREHENSIVE METABOLIC PANEL WITH GFR - Abnormal; Notable for  the following components:   Glucose, Bld 126 (*)    All other components within normal limits    EKG EKG Interpretation Date/Time:  Sunday July 09 2023 09:05:02 EDT Ventricular Rate:  59 PR Interval:  200 QRS Duration:  93 QT Interval:  420 QTC Calculation: 416 R Axis:   65  Text Interpretation: Sinus rhythm Probable left atrial enlargement Anteroseptal infarct, age indeterminate Confirmed by Iva Mariner [phone]) on 07/09/2023 9:32:55 AM  Radiology DG Hand 2 View Right Result Date: 07/09/2023 CLINICAL DATA:  Injury, pain and swelling EXAM: RIGHT HAND - 2 VIEW COMPARISON:  None Available. FINDINGS: There is no evidence of fracture or dislocation. There is no evidence of arthropathy or other focal bone abnormality. Soft tissues are unremarkable. Persistent tiny punctate 2 mm radiopaque foreign body along the right middle finger distal phalanx tuft again noted. a IMPRESSION: 1. No acute osseous finding. 2. Persistent tiny punctate 2 mm radiopaque foreign body along the right middle finger distal phalanx tuft. Electronically Signed   By: Melven Stable.  Shick M.D.   On: 07/09/2023 09:32    Procedures Procedures    Medications Ordered in ED Medications  diphenhydrAMINE  (BENADRYL ) capsule 25 mg (25 mg Oral Given 07/09/23 0910)  famotidine  (PEPCID ) tablet 20 mg (20 mg Oral Given 07/09/23 0910)  dexamethasone  (DECADRON ) injection 10 mg (10 mg Intramuscular Given 07/09/23 0911)    ED Course/ Medical Decision Making/ A&P                                 Medical Decision Making Amount and/or Complexity of Data Reviewed Labs: ordered. Radiology: ordered.  Risk Prescription drug management.   Patient presenting for pruritus.  Onset was yesterday.  This did follow an episode the day prior where he did strike this area of his hand with a garden tool and did have some mild pain at the time.  He did not notice any breaks in the skin.  He thinks he may have been bit by an insect at the time.  On arrival in  the ED today, he is well-appearing.  Area of concern does have some mild swelling and erythema and a localized area on the medial aspect of his proximal right hand.  It does extend to his medial wrist.  There is no significant tenderness or warmth.  I do not appreciate any breaks in the skin.  Presentation consistent with histamine reaction.  Patient was  treated with steroid, H1 blocker, and H2 blocker.  Will check lab work.  Lab results were unremarkable.  On cardiac monitor, patient had normal sinus rhythm with frequent PVCs.  While in the ED, he had improvement in swelling and pruritus.  He was prescribed ongoing steroid and histamine blockers.  He was discharged in stable condition.        Final Clinical Impression(s) / ED Diagnoses Final diagnoses:  Pruritus    Rx / DC Orders ED Discharge Orders          Ordered    predniSONE (DELTASONE) 10 MG tablet  Daily        07/09/23 1138    famotidine  (PEPCID ) 20 MG tablet  2 times daily        07/09/23 1138    diphenhydrAMINE  (BENADRYL ) 25 MG tablet  Every 6 hours PRN        07/09/23 1138              Iva Mariner, MD 07/09/23 1139

## 2023-07-09 NOTE — ED Notes (Signed)
 Phlebotomy at bedside.

## 2023-07-10 ENCOUNTER — Telehealth: Payer: Self-pay

## 2023-07-10 NOTE — Telephone Encounter (Signed)
 The pepcid  (famotidine ) can cause flushing. Hold the next dose and then we will see how he does. Continue the other prescribed medications at the hospital

## 2023-07-10 NOTE — Telephone Encounter (Signed)
 Copied from CRM [id number]. Topic: Clinical - Medical Advice >> Jul 10, 2023 11:02 AM Deaijah H wrote: Reason for CRM: Patient's wife called in due to him being prescribed predniSONE (DELTASONE) 10 MG tablet/famotidine  (PEPCID ) 20 MG tablet prescribed from ER, patient is now looking flushed not sure which medication is causing that. Would like medical advice please call amotidine (PEPCID ) 20 MG tablet

## 2023-07-10 NOTE — Telephone Encounter (Signed)
 Contacted pt.  Relayed information Pcp provided and pt verbalized understanding.  Has no questions or concerns.  Pt has OV tomorrow 07/11/23.

## 2023-07-11 ENCOUNTER — Ambulatory Visit (INDEPENDENT_AMBULATORY_CARE_PROVIDER_SITE_OTHER): Admitting: Nurse Practitioner

## 2023-07-11 ENCOUNTER — Inpatient Hospital Stay: Admitting: Nurse Practitioner

## 2023-07-11 VITALS — BP 138/74 | HR 57 | Temp 97.6°F | Ht 68.0 in | Wt 143.0 lb

## 2023-07-11 DIAGNOSIS — R232 Flushing: Secondary | ICD-10-CM | POA: Diagnosis not present

## 2023-07-11 DIAGNOSIS — Z09 Encounter for follow-up examination after completed treatment for conditions other than malignant neoplasm: Secondary | ICD-10-CM | POA: Insufficient documentation

## 2023-07-11 NOTE — Patient Instructions (Addendum)
 Nice to see you today Finish the prednisone as prescribed STOP taking the Pepcid  (famotidine ) You can use benadryl  as needed. A non-drowsy option is Claritin

## 2023-07-11 NOTE — Assessment & Plan Note (Signed)
 Patient stopped having flushing and we discontinued famotidine .

## 2023-07-11 NOTE — Progress Notes (Signed)
 Acute Office Visit  Subjective:     Patient ID: William Black, male    DOB: 09/26/1940, 83 y.o.   MRN: [id number]  Chief Complaint  Patient presents with   Hospitalization Follow-up    Pt complains of feelings better and flushing appears to have gone away.     HPI Patient is in today for hospital follow up   He was seen in the ED on 07/09/2023 for hand injury and pruritus.  At that visit he had done some yard work 2 days previous.  When he went to do the heavy tool it struck him in the medial aspect of the right hand.  He had some associated pain but continued doing his work.  He is not aware of any exposure to allergens.  The following day he developed redness, swelling, and pruritus to the medial aspect of his right hand.  He had been trying topical ointments without relief.  Patient was given Benadryl , famotidine , dexamethasone .  We also did some basic lab work with labs being unremarkable.  He was prescribed steroids and H1 blocker and discharged home.  Patient is here for follow-up Of note patient did reach out and was having some flushing and he was instructed to hold the H2 blocker but to continue the steroid   Review of Systems  Constitutional:  Negative for chills and fever.  Respiratory:  Negative for shortness of breath.   Cardiovascular:  Negative for chest pain.  Skin:  Positive for itching. Negative for rash.  Neurological:  Negative for headaches.        Objective:    BP 138/74   Pulse (!) 57   Temp 97.6 F (36.4 C) (Oral)   Ht 5\' 8"  (1.727 m)   Wt 143 lb (64.9 kg)   SpO2 94%   BMI 21.74 kg/m  BP Readings from Last 3 Encounters:  07/11/23 138/74  07/09/23 126/78  06/01/23 132/88   Wt Readings from Last 3 Encounters:  07/11/23 143 lb (64.9 kg)  06/01/23 145 lb 3.2 oz (65.9 kg)  01/20/23 145 lb 6.4 oz (66 kg)   SpO2 Readings from Last 3 Encounters:  07/11/23 94%  07/09/23 98%  06/01/23 95%      Physical Exam Vitals and nursing note reviewed.   Constitutional:      Appearance: Normal appearance.  Cardiovascular:     Rate and Rhythm: Normal rate and regular rhythm.     Heart sounds: Normal heart sounds.  Pulmonary:     Effort: Pulmonary effort is normal.     Breath sounds: Normal breath sounds.  Neurological:     Mental Status: He is alert.     No results found for any visits on 07/11/23.      Assessment & Plan:   Problem List Items Addressed This Visit       Cardiovascular and Mediastinum   Flushing   Patient stopped having flushing and we discontinued famotidine .      Relevant Medications   diltiazem  (TIAZAC ) 360 MG 24 hr capsule     Other   Hospital discharge follow-up - Primary   Reviewed ED note, labs, imaging.  Patient will finish prednisone prescription as prescribed EDP.  We will discontinue famotidine .  Continue using Benadryl  as needed if he wants a less sedating option he can use Claritin instead.  Patient plans on using hand coverings when working out in the yard and garden next time       No orders of the defined  types were placed in this encounter.   Return if symptoms worsen or fail to improve, for As scheduled .  Margarie Shay, NP

## 2023-07-11 NOTE — Assessment & Plan Note (Addendum)
 Reviewed ED note, labs, imaging.  Patient will finish prednisone prescription as prescribed EDP.  We will discontinue famotidine .  Continue using Benadryl  as needed if he wants a less sedating option he can use Claritin instead.  Patient plans on using hand coverings when working out in the yard and garden next time

## 2023-07-19 DIAGNOSIS — H2513 Age-related nuclear cataract, bilateral: Secondary | ICD-10-CM | POA: Diagnosis not present

## 2023-07-19 DIAGNOSIS — H04123 Dry eye syndrome of bilateral lacrimal glands: Secondary | ICD-10-CM | POA: Diagnosis not present

## 2023-07-19 DIAGNOSIS — D369 Benign neoplasm, unspecified site: Secondary | ICD-10-CM | POA: Diagnosis not present

## 2023-07-25 ENCOUNTER — Encounter: Payer: Self-pay | Admitting: Nurse Practitioner

## 2023-07-26 DIAGNOSIS — D23121 Other benign neoplasm of skin of left upper eyelid, including canthus: Secondary | ICD-10-CM | POA: Diagnosis not present

## 2023-07-28 DIAGNOSIS — H02844 Edema of left upper eyelid: Secondary | ICD-10-CM | POA: Diagnosis not present

## 2023-08-14 ENCOUNTER — Other Ambulatory Visit: Payer: Self-pay | Admitting: Nurse Practitioner

## 2023-08-14 MED ORDER — ATORVASTATIN CALCIUM 20 MG PO TABS: 20.0000 mg | ORAL_TABLET | Freq: Every day | ORAL | 1 refills | Status: DC

## 2023-08-14 NOTE — Telephone Encounter (Signed)
 Copied from CRM [id number]. Topic: Clinical - Medication Refill >> Aug 14, 2023  8:58 AM Lizabeth Riggs wrote: Medication: atorvastatin  (LIPITOR) 20 MG tablet   Has the patient contacted their pharmacy? Yes (Agent: If no, request that the patient contact the pharmacy for the refill. If patient does not wish to contact the pharmacy document the reason why and proceed with request.) (Agent: If yes, when and what did the pharmacy advise?) pharmacy needs order to refill  This is the patient's preferred pharmacy:  [facility] [address] [place], [place] - [address] [place] [address] Phone: [phone] Fax: [phone]  Is this the correct pharmacy for this prescription? Yes If no, delete pharmacy and type the correct one.   Has the prescription been filled recently? No  Is the patient out of the medication? No  Has the patient been seen for an appointment in the last year OR does the patient have an upcoming appointment? Yes  Can we respond through MyChart? No  Agent: Please be advised that Rx refills may take up to 3 business days. We ask that you follow-up with your pharmacy.

## 2023-08-21 ENCOUNTER — Encounter: Payer: Self-pay | Admitting: Nurse Practitioner

## 2023-09-19 NOTE — Progress Notes (Unsigned)
     Deejay Koppelman T. Zacaria Pousson, MD, CAQ Sports Medicine [facility] [address] [place], [address]  Phone: [phone]  FAX: [phone]  [place] - 83 y.o. male  MRN [id number]  Date of Birth: 22-Apr-1940  Date: 09/20/2023  PCP: [name], NP  Referral: [name], NP  No chief complaint on file.  Subjective:   Kaizer Kindt is a 83 y.o. very pleasant male patient with There is no height or weight on file to calculate BMI. who presents with the following:  This is a very pleasant patient who is a new patient to me who presents with intermittent headache.    Review of Systems is noted in the HPI, as appropriate  Objective:   There were no vitals taken for this visit.  GEN: No acute distress; alert,appropriate. PULM: Breathing comfortably in no respiratory distress PSYCH: Normally interactive.   Laboratory and Imaging Data:  Assessment and Plan:   ***

## 2023-09-20 ENCOUNTER — Encounter: Payer: Self-pay | Admitting: Family Medicine

## 2023-09-20 ENCOUNTER — Ambulatory Visit (INDEPENDENT_AMBULATORY_CARE_PROVIDER_SITE_OTHER): Admitting: Family Medicine

## 2023-09-20 VITALS — BP 120/90 | HR 69 | Temp 98.7°F | Ht 68.0 in | Wt 142.5 lb

## 2023-09-20 DIAGNOSIS — J069 Acute upper respiratory infection, unspecified: Secondary | ICD-10-CM

## 2023-09-20 DIAGNOSIS — J029 Acute pharyngitis, unspecified: Secondary | ICD-10-CM | POA: Diagnosis not present

## 2023-09-20 DIAGNOSIS — G44209 Tension-type headache, unspecified, not intractable: Secondary | ICD-10-CM | POA: Diagnosis not present

## 2023-09-20 DIAGNOSIS — J301 Allergic rhinitis due to pollen: Secondary | ICD-10-CM

## 2023-09-20 LAB — POC COVID19 BINAXNOW: SARS Coronavirus 2 Ag: NEGATIVE

## 2023-09-20 MED ORDER — BUTALBITAL-APAP-CAFFEINE 50-325-40 MG PO TABS: 1.0000 | ORAL_TABLET | Freq: Four times a day (QID) | ORAL | 0 refills | Status: DC | PRN

## 2023-09-20 MED ORDER — PREDNISONE 20 MG PO TABS
ORAL_TABLET | ORAL | 0 refills | Status: DC
Start: 2023-09-20 — End: 2023-10-31

## 2023-09-25 ENCOUNTER — Ambulatory Visit (INDEPENDENT_AMBULATORY_CARE_PROVIDER_SITE_OTHER): Admitting: Family Medicine

## 2023-09-25 ENCOUNTER — Ambulatory Visit
Admission: RE | Admit: 2023-09-25 | Discharge: 2023-09-25 | Disposition: A | Discharge status: Home / Self Care | Source: Ambulatory Visit | Attending: Family Medicine | Admitting: Family Medicine

## 2023-09-25 ENCOUNTER — Encounter: Payer: Self-pay | Admitting: Family Medicine

## 2023-09-25 ENCOUNTER — Ambulatory Visit: Admitting: Family Medicine

## 2023-09-25 ENCOUNTER — Ambulatory Visit: Payer: Self-pay | Admitting: Family Medicine

## 2023-09-25 ENCOUNTER — Ambulatory Visit
Admission: RE | Admit: 2023-09-25 | Discharge: 2023-09-25 | Disposition: A | Discharge status: Home / Self Care | Attending: Family Medicine | Admitting: Family Medicine

## 2023-09-25 ENCOUNTER — Telehealth: Payer: Self-pay

## 2023-09-25 VITALS — BP 139/70 | HR 70 | Temp 97.8°F | Ht 68.0 in | Wt 141.4 lb

## 2023-09-25 DIAGNOSIS — J069 Acute upper respiratory infection, unspecified: Secondary | ICD-10-CM | POA: Diagnosis not present

## 2023-09-25 DIAGNOSIS — I700 Atherosclerosis of aorta: Secondary | ICD-10-CM | POA: Diagnosis not present

## 2023-09-25 DIAGNOSIS — R058 Other specified cough: Secondary | ICD-10-CM | POA: Diagnosis not present

## 2023-09-25 DIAGNOSIS — R062 Wheezing: Secondary | ICD-10-CM | POA: Diagnosis not present

## 2023-09-25 DIAGNOSIS — I771 Stricture of artery: Secondary | ICD-10-CM | POA: Diagnosis not present

## 2023-09-25 NOTE — Telephone Encounter (Signed)
 SABRA

## 2023-09-25 NOTE — Telephone Encounter (Signed)
 I spoke with pt; pt said no fever but pt has non prod cough and wheezing. NO CP or SOB. Appt scheduled with Dr Randeen 09/25/23 at 10 am with UC & ED precautions and pt and pts wife voiced understanding. Sending note to Dr Randeen.

## 2023-09-25 NOTE — Assessment & Plan Note (Addendum)
 Since 7/9  Nasal congestion/ chest congestion and cough No shortness of breath  Reassuring exam  Pulse ox is below baseline at 93% Cxr : no new/ acute changes (has right lower calcified granuloma)   Suspect viral  Instructed to treat symptoms (mucinex DM) Stop fast acting nasal spray  Watch closely for development of reactive airways (is already on prednisone )  Fluids/rest  Update if not starting to improve in a week or if worsening  Call back and Er precautions noted in detail today

## 2023-09-25 NOTE — Telephone Encounter (Signed)
 Will see patient then Agree with ER and UC precautions

## 2023-09-25 NOTE — Patient Instructions (Addendum)
 Go over to the imaging center on [facility]   We will reach out with xray results when we get them and make a plan    Continue the mucinex if you can tolerate it

## 2023-09-25 NOTE — Progress Notes (Signed)
 Subjective:    Patient ID: William Black, male    DOB: 11/16/1940, 83 y.o.   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  Was seen by Dr Watt on 7/9 for this and headache  Was prescription round of prednisone   Covid test was negative then   Started last Wednesday  Headache is better  Had a ST -that is better  Now cough with phlegm - cannot quite get it all the way out  Some nasal congestion (better than it was)  Mucous has been mainly clear  Did have runny nose and sneezing   Unsure if wheezing (just junky sounding) No shortness of breath    Over the counter  Mucinex DM (side effects of diarrhea) Afrin (?)    Cxr today DG Chest 2 View Result Date: 09/25/2023 CLINICAL DATA:  productive cough -worsening since last week pulse ox is 93% scant wheeze on exam EXAM: CHEST - 2 VIEW COMPARISON:  January 28, 2018 FINDINGS: No focal airspace consolidation, pleural effusion, or pneumothorax. Right lower lobe calcified granuloma. No cardiomegaly. Tortuous aorta with aortic atherosclerosis. No acute fracture or destructive lesion. Osteopenia. Multilevel thoracic osteophytosis. IMPRESSION: No acute cardiopulmonary abnormality. Electronically Signed   By: Rogelia Myers M.D.   On: 09/25/2023 12:46      Patient Active Problem List   Diagnosis Date Noted   URI with cough and congestion 09/25/2023   Hospital discharge follow-up 07/11/2023   Irregular heartbeat 06/01/2023   HTN (hypertension) 10/30/2018   Hyperlipemia 10/30/2018   Chronic calculous cholecystitis 01/28/2018   Past Medical History:  Diagnosis Date   Deafness in right ear    High cholesterol    Hyperlipemia 10/30/2018    Hypertension    Kidney stones    Kidney stones    Past Surgical History:  Procedure Laterality Date   APPENDECTOMY     CHOLECYSTECTOMY N/A 01/29/2018   Procedure: LAPAROSCOPIC CHOLECYSTECTOMY WITH INTRAOPERATIVE CHOLANGIOGRAM;  Surgeon: Belinda Cough, MD;  Location: [facility] OR;  Service: General;  Laterality: N/A;   CYST EXCISION     testicular   CYSTOSCOPY     DENTAL SURGERY     ERCP N/A 01/30/2018   Procedure: ENDOSCOPIC RETROGRADE CHOLANGIOPANCREATOGRAPHY (ERCP);  Surgeon: Burnette Fallow, MD;  Location: [facility] ENDOSCOPY;  Service: Endoscopy;  Laterality: N/A;   INGUINAL HERNIA REPAIR Left 03/22/2018   Procedure: LEFT INGUINAL HERNIA REPAIR ERAS PATHWAY;  Surgeon: Belinda Cough, MD;  Location: [facility];  Service: General;  Laterality: Left;   INSERTION OF MESH Left 03/22/2018   Procedure: INSERTION OF MESH;  Surgeon: Belinda Cough, MD;  Location: [facility];  Service: General;  Laterality: Left;   REMOVAL OF STONES  01/30/2018   Procedure: REMOVAL OF STONES;  Surgeon: Burnette Fallow, MD;  Location: [facility] ENDOSCOPY;  Service: Endoscopy;;   SPHINCTEROTOMY  01/30/2018   Procedure: ANNETT;  Surgeon: Burnette Fallow, MD;  Location: [facility] ENDOSCOPY;  Service: Endoscopy;;   Social History   Tobacco Use   Smoking status: Every Day    Current packs/day: 0.50    Average packs/day:  0.5 packs/day for 50.0 years (25.0 ttl pk-yrs)    Types: Cigarettes   Smokeless tobacco: Never  Vaping Use   Vaping status: Never Used  Substance Use Topics   Alcohol use: Not Currently    Comment: occ   Drug use: No   Family History  Problem Relation Age of Onset   Alcohol abuse Mother    No Known Allergies Current Outpatient Medications on File Prior to Visit  Medication Sig Dispense Refill   atorvastatin  (LIPITOR) 20 MG tablet Take 1 tablet (20 mg total) by mouth at bedtime. 90 tablet 1   butalbital -acetaminophen -caffeine  (FIORICET) 50-325-40 MG tablet Take 1 tablet  by mouth every 6 (six) hours as needed for headache. 20 tablet 0   diltiazem  (CARDIZEM  CD) 360 MG 24 hr capsule Take 1 capsule (360 mg total) by mouth daily. 90 capsule 1   guaiFENesin (MUCINEX) 600 MG 12 hr tablet Take 600 mg by mouth 2 (two) times daily.     lisinopril  (ZESTRIL ) 40 MG tablet Take 1 tablet (40 mg total) by mouth daily. 90 tablet 1   Multiple Vitamins-Minerals (MENS 50+ MULTI VITAMIN/MIN PO) Take by mouth daily.     predniSONE  (DELTASONE ) 20 MG tablet 2 tabs po for 4 days, then 1 tab po for 4 days 12 tablet 0   No current facility-administered medications on file prior to visit.    Review of Systems  Constitutional:  Positive for appetite change and fatigue. Negative for fever.  HENT:  Positive for congestion, postnasal drip, rhinorrhea, sinus pressure, sneezing and sore throat. Negative for ear pain.   Eyes:  Negative for pain and discharge.  Respiratory:  Positive for cough. Negative for shortness of breath, wheezing and stridor.   Cardiovascular:  Negative for chest pain.  Gastrointestinal:  Positive for diarrhea. Negative for nausea and vomiting.  Genitourinary:  Negative for frequency, hematuria and urgency.  Musculoskeletal:  Negative for arthralgias and myalgias.  Skin:  Negative for rash.  Neurological:  Positive for headaches. Negative for dizziness, weakness and light-headedness.  Psychiatric/Behavioral:  Negative for confusion and dysphoric mood.        Objective:   Physical Exam Constitutional:      General: He is not in acute distress.    Appearance: Normal appearance. He is well-developed and normal weight. He is not ill-appearing, toxic-appearing or diaphoretic.  HENT:     Head: Normocephalic and atraumatic.     Comments: Nares are injected and congested      Right Ear: Tympanic membrane, ear canal and external ear normal.     Left Ear: Tympanic membrane, ear canal and external ear normal.     Nose: Congestion and rhinorrhea present.     Comments:  Mild nasal congestion  No sinus tenderness     Mouth/Throat:     Mouth: Mucous membranes are moist.     Pharynx: Oropharynx is clear. No oropharyngeal exudate or posterior oropharyngeal erythema.     Comments: Clear pnd  Eyes:     General:        Right eye: No discharge.        Left eye: No discharge.     Conjunctiva/sclera: Conjunctivae normal.     Pupils: Pupils are equal, round, and reactive to light.  Cardiovascular:     Rate and Rhythm: Normal rate.     Heart sounds: Normal heart sounds.  Pulmonary:     Effort: Pulmonary effort is normal. No respiratory distress.     Breath sounds: Normal breath  sounds. No stridor. No wheezing, rhonchi or rales.     Comments: Good air exch No wheeze or stridor  Upper airway sounds with forced expiratoin    Chest:     Chest wall: No tenderness.  Musculoskeletal:     Cervical back: Normal range of motion and neck supple.  Lymphadenopathy:     Cervical: No cervical adenopathy.  Skin:    General: Skin is warm and dry.     Capillary Refill: Capillary refill takes less than 2 seconds.     Findings: No rash.  Neurological:     Mental Status: He is alert.     Cranial Nerves: No cranial nerve deficit.  Psychiatric:        Mood and Affect: Mood normal.           Assessment & Plan:   Problem List Items Addressed This Visit       Respiratory   URI with cough and congestion - Primary   Since 7/9  Nasal congestion/ chest congestion and cough No shortness of breath  Reassuring exam  Pulse ox is below baseline at 93% Cxr : no new/ acute changes (has right lower calcified granuloma)   Suspect viral  Instructed to treat symptoms (mucinex DM) Stop fast acting nasal spray  Watch closely for development of reactive airways (is already on prednisone )  Fluids/rest  Update if not starting to improve in a week or if worsening  Call back and Er precautions noted in detail today        Relevant Orders   DG Chest 2 View (Completed)

## 2023-09-29 ENCOUNTER — Other Ambulatory Visit: Payer: Self-pay | Admitting: Family Medicine

## 2023-09-29 ENCOUNTER — Ambulatory Visit: Payer: Self-pay

## 2023-09-29 MED ORDER — PROMETHAZINE-DM 6.25-15 MG/5ML PO SYRP: 5.0000 mL | ORAL_SOLUTION | Freq: Three times a day (TID) | ORAL | 0 refills | Status: DC | PRN

## 2023-09-29 NOTE — Telephone Encounter (Signed)
 Pt's wife and daughter notified of Dr. Graham comments and verbalized understanding they are on their way to pick up med

## 2023-09-29 NOTE — Telephone Encounter (Signed)
 FYI Only or Action Required?: Action required by provider: clinical question for provider and update on patient condition.  Patient was last seen in primary care on 09/25/2023 by Randeen Laine LABOR, MD.  Called Nurse Triage reporting Cough.  Symptoms began a week ago.  Interventions attempted: OTC medications: Mucinex and Prescription medications: prednisone .  Symptoms are: unchanged.  Triage Disposition: Call PCP When Office is Open (overriding Home Care)  Patient/caregiver understands and will follow disposition?: Yes                             Copied from CRM [id number]. Topic: Clinical - Red Word Triage >> Sep 29, 2023  8:57 AM Donna BRAVO wrote: Red Word that prompted transfer to Nurse Triage: Patient wife Ronal, cough is not getting better, when coughing mucus goes up and then back down, he can't get the mucus up and out, Mucinex is causing diarrhea, Reason for Disposition  Cough  Answer Assessment - Initial Assessment Questions 1. ONSET: When did the cough begin?      1-2 weeks ago 2. SEVERITY: How bad is the cough today?      States coughing spells are not frequent, but patient struggles to get mucous up  3. SPUTUM: Describe the color of your sputum (e.g., none, dry cough; clear, white, yellow, green)     Unsure, states patient cannot get mucous up 5. DIFFICULTY BREATHING: Are you having difficulty breathing? If Yes, ask: How bad is it? (e.g., mild, moderate, severe)      Denies 6. FEVER: Do you have a fever? If Yes, ask: What is your temperature, how was it measured, and when did it start?     Denies 10. OTHER SYMPTOMS: Do you have any other symptoms? (e.g., runny nose, wheezing, chest pain)     Occasional diarrhea, denies wheezing, denies chest pain     OV on 7/14/125, states symptoms are not improving. Taking Mucinex and finished prednisone . Patient is still having a hard time coughing up mucous. Patient seeking provider's advice on  additional medications he can take. No availability in office today.  Protocols used: Cough - Acute Productive-A-AH

## 2023-09-29 NOTE — Telephone Encounter (Signed)
 If mucinex is not tolerated (diarrhea) then stop it  Not much I can do to loosen congestion then  Breathing humidity/steam can help some Keep up a good fluid intake    I will send in prometh -dm to help quiet the cough and give him some relief  Use caution for sedation with this   If not improving by Monday -call for appointment with [facility] for a re check   If worse over weekend- go to [facility] (ER if severe)   So sorry this is not improving!  Will cc to pcp

## 2023-10-31 ENCOUNTER — Encounter: Payer: Self-pay | Admitting: Internal Medicine

## 2023-10-31 ENCOUNTER — Ambulatory Visit (INDEPENDENT_AMBULATORY_CARE_PROVIDER_SITE_OTHER): Admitting: Internal Medicine

## 2023-10-31 VITALS — BP 130/76 | HR 78 | Temp 98.5°F | Ht 68.0 in | Wt 143.0 lb

## 2023-10-31 DIAGNOSIS — R519 Headache, unspecified: Secondary | ICD-10-CM | POA: Diagnosis not present

## 2023-10-31 NOTE — Assessment & Plan Note (Addendum)
 Starts in sinuses and goes to occiput No caffeine  More now in hot/humid weather Exam is reassuring Discussed analgesic headache---asked him to stop the ibuprofen  for a few days to see if that helps will refer to headache specialist
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
## 2023-11-16 ENCOUNTER — Ambulatory Visit

## 2023-11-16 VITALS — BP 130/76 | Ht 68.0 in | Wt 146.0 lb

## 2023-11-16 DIAGNOSIS — Z0000 Encounter for general adult medical examination without abnormal findings: Secondary | ICD-10-CM

## 2023-11-16 DIAGNOSIS — Z2821 Immunization not carried out because of patient refusal: Secondary | ICD-10-CM

## 2023-11-16 NOTE — Patient Instructions (Signed)
 Mr. Goodall , Thank you for taking time out of your busy schedule to complete your Annual Wellness Visit with me. I enjoyed our conversation and look forward to speaking with you again next year. I, as well as your care team,  appreciate your ongoing commitment to your health goals. Please review the following plan we discussed and let me know if I can assist you in the future. Your Game plan/ To Do List    Referrals: If you haven't heard from the office you've been referred to, please reach out to them at the phone provided.   Follow up Visits: We will see or speak with you next year for your Next Medicare AWV with our clinical staff Have you seen your provider in the last 6 months (3 months if uncontrolled diabetes)? Yes  Clinician Recommendations:  Aim for 30 minutes of exercise or brisk walking, 6-8 glasses of water, and 5 servings of fruits and vegetables each day.       This is a list of the screenings recommended for you:  Health Maintenance  Topic Date Due   Screening for Lung Cancer  05/25/2021   COVID-19 Vaccine (3 - 2025-26 season) 11/13/2023   Flu Shot  06/11/2024*   Medicare Annual Wellness Visit  11/15/2024   DTaP/Tdap/Td vaccine (5 - Td or Tdap) 02/28/2027   Pneumococcal Vaccine for age over 76  Completed   Zoster (Shingles) Vaccine  Completed   HPV Vaccine  Aged Out   Meningitis B Vaccine  Aged Out   Colon Cancer Screening  Discontinued  *Topic was postponed. The date shown is not the original due date.    Advanced directives: (Declined) Advance directive discussed with you today. Even though you declined this today, please call our office should you change your mind, and we can give you the proper paperwork for you to fill out. Advance Care Planning is important because it:  [x]  Makes sure you receive the medical care that is consistent with your values, goals, and preferences  [x]  It provides guidance to your family and loved ones and reduces their decisional  burden about whether or not they are making the right decisions based on your wishes.  Follow the link provided in your after visit summary or read over the paperwork we have mailed to you to help you started getting your Advance Directives in place. If you need assistance in completing these, please reach out to us  so that we can help you!  See attachments for Preventive Care and Fall Prevention Tips.

## 2023-11-16 NOTE — Progress Notes (Signed)
 Because this visit was a virtual/telehealth visit,  certain criteria was not obtained, such a blood pressure, CBG if applicable, and timed get up and go. Any medications not marked as taking were not mentioned during the medication reconciliation part of the visit. Any vitals not documented were not able to be obtained due to this being a telehealth visit or patient was unable to self-report a recent blood pressure reading due to a lack of equipment at home via telehealth. Vitals that have been documented are verbally provided by the patient.  This visit was performed by a medical professional under my direct supervision. I was immediately available for consultation/collaboration. I have reviewed and agree with the Annual Wellness Visit documentation.  Subjective:   William Black is a 83 y.o. who presents for a Medicare Wellness preventive visit.  As a reminder, Annual Wellness Visits don't include a physical exam, and some assessments may be limited, especially if this visit is performed virtually. We may recommend an in-person follow-up visit with your provider if needed.  Visit Complete: Virtual I connected with  William Black on 11/16/23 by a audio enabled telemedicine application and verified that I am speaking with the correct person using two identifiers.  Patient Location: Home  Provider Location: Home Office  I discussed the limitations of evaluation and management by telemedicine. The patient expressed understanding and agreed to proceed.  Vital Signs: Because this visit was a virtual/telehealth visit, some criteria may be missing or patient reported. Any vitals not documented were not able to be obtained and vitals that have been documented are patient reported.  VideoDeclined- This patient declined Librarian, academic. Therefore the visit was completed with audio only.  Persons Participating in Visit: Patient.  AWV Questionnaire: No: Patient  Medicare AWV questionnaire was not completed prior to this visit.  Cardiac Risk Factors include: advanced age (>40men, >55 women);male gender;hypertension;dyslipidemia     Objective:    Today's Vitals   11/16/23 1357  BP: 130/76  Weight: 146 lb (66.2 kg)  Height: 5' 8 (1.727 m)   Body mass index is 22.2 kg/m.     11/16/2023    2:01 PM 07/09/2023    8:57 AM 03/22/2018    7:56 AM 03/13/2018    2:04 PM 01/28/2018    8:45 PM 01/28/2018    9:28 AM 10/04/2017    9:26 PM  Advanced Directives  Does Patient Have a Medical Advance Directive? Yes Yes Yes  No  Yes  No  No   Type of Estate agent of [place];Living will Healthcare Power of [place];Living will Living will  Healthcare Power of [place];Living will    Does patient want to make changes to medical advance directive? No - Patient declined  No - Patient declined   No - Patient declined  No - Patient declined    Copy of Healthcare Power of Attorney in Chart? No - copy requested    No - copy requested     Would patient like information on creating a medical advance directive?      No - Patient declined       Data saved with a previous flowsheet row definition    Current Medications (verified) Outpatient Encounter Medications as of 11/16/2023  Medication Sig   atorvastatin  (LIPITOR) 20 MG tablet Take 1 tablet (20 mg total) by mouth at bedtime.   butalbital -acetaminophen -caffeine  (FIORICET) 50-325-40 MG tablet Take 1 tablet by mouth every 6 (six) hours as needed for headache.   diltiazem  (  CARDIZEM  CD) 360 MG 24 hr capsule Take 1 capsule (360 mg total) by mouth daily.   lisinopril  (ZESTRIL ) 40 MG tablet Take 1 tablet (40 mg total) by mouth daily.   Multiple Vitamins-Minerals (MENS 50+ MULTI VITAMIN/MIN PO) Take by mouth daily.   No facility-administered encounter medications on file as of 11/16/2023.    Allergies (verified) Patient has no known allergies.   History: Past Medical History:  Diagnosis Date    Deafness in right ear    High cholesterol    Hyperlipemia 10/30/2018   Hypertension    Kidney stones    Kidney stones    Past Surgical History:  Procedure Laterality Date   APPENDECTOMY     CHOLECYSTECTOMY N/A 01/29/2018   Procedure: LAPAROSCOPIC CHOLECYSTECTOMY WITH INTRAOPERATIVE CHOLANGIOGRAM;  Surgeon: Belinda Cough, MD;  Location: [facility] OR;  Service: General;  Laterality: N/A;   CYST EXCISION     testicular   CYSTOSCOPY     DENTAL SURGERY     ERCP N/A 01/30/2018   Procedure: ENDOSCOPIC RETROGRADE CHOLANGIOPANCREATOGRAPHY (ERCP);  Surgeon: Burnette Fallow, MD;  Location: [facility] ENDOSCOPY;  Service: Endoscopy;  Laterality: N/A;   INGUINAL HERNIA REPAIR Left 03/22/2018   Procedure: LEFT INGUINAL HERNIA REPAIR ERAS PATHWAY;  Surgeon: Belinda Cough, MD;  Location: [facility];  Service: General;  Laterality: Left;   INSERTION OF MESH Left 03/22/2018   Procedure: INSERTION OF MESH;  Surgeon: Belinda Cough, MD;  Location: [facility];  Service: General;  Laterality: Left;   REMOVAL OF STONES  01/30/2018   Procedure: REMOVAL OF STONES;  Surgeon: Burnette Fallow, MD;  Location: [facility] ENDOSCOPY;  Service: Endoscopy;;   SPHINCTEROTOMY  01/30/2018   Procedure: ANNETT;  Surgeon: Burnette Fallow, MD;  Location: [facility] ENDOSCOPY;  Service: Endoscopy;;   Family History  Problem Relation Age of Onset   Alcohol abuse Mother    Social History   Socioeconomic History   Marital status: Married    Spouse name: Not on file   Number of children: 1   Years of education: Not on file   Highest education level: Not on file  Occupational History   Not on file  Tobacco Use   Smoking status: Every Day    Current packs/day: 0.50    Average packs/day: 0.5 packs/day for 50.0 years (25.0 ttl pk-yrs)    Types: Cigarettes   Smokeless tobacco: Never  Vaping Use   Vaping status: Never Used  Substance and Sexual Activity   Alcohol use: Not Currently    Comment: occ   Drug  use: No   Sexual activity: Not on file  Other Topics Concern   Not on file  Social History Narrative   Retired He use to work for [employer] (53)   Social Drivers of Health   Financial Resource Strain: Not on file  Food Insecurity: No Food Insecurity (11/16/2023)   Hunger Vital Sign    Worried About Running Out of Food in the Last Year: Never true    Ran Out of Food in the Last Year: Never true  Transportation Needs: No Transportation Needs (11/16/2023)   PRAPARE - Administrator, Civil Service (Medical): No    Lack of Transportation (Non-Medical): No  Physical Activity: Patient Declined (11/16/2023)   Exercise Vital Sign    Days of Exercise per Week: Patient declined    Minutes of Exercise per Session: Patient declined  Stress: No Stress Concern Present (11/16/2023)  [employer] of Occupational Health - Occupational Stress Questionnaire    Feeling of Stress: Not at all  Social Connections: Socially Integrated (11/16/2023)   Social Connection and Isolation Panel    Frequency of Communication with Friends and Family: More than three times a week    Frequency of Social Gatherings with Friends and Family: More than three times a week    Attends Religious Services: More than 4 times per year    Active Member of [employer] or Organizations: Yes    Attends Engineer, structural: More than 4 times per year    Marital Status: Married    Tobacco Counseling Ready to quit: Not Answered Counseling given: Not Answered    Clinical Intake:  Pre-visit preparation completed: Yes  Pain : No/denies pain     BMI - recorded: 22.2 Nutritional Status: BMI of 19-24  Normal Nutritional Risks: None Diabetes: No  Lab Results  Component Value Date   HGBA1C 6.0 06/20/2022     How often do you need to have someone help you when you read instructions, pamphlets, or other written materials from your doctor or pharmacy?: 1 - Never  Interpreter  Needed?: No  Information entered by :: Dniya Neuhaus,CMA   Activities of Daily Living     11/16/2023    2:00 PM  In your present state of health, do you have any difficulty performing the following activities:  Hearing? 0  Vision? 0  Difficulty concentrating or making decisions? 0  Walking or climbing stairs? 0  Dressing or bathing? 0  Doing errands, shopping? 0  Preparing Food and eating ? N  Using the Toilet? N  In the past six months, have you accidently leaked urine? N  Do you have problems with loss of bowel control? N  Managing your Medications? N  Managing your Finances? N  Housekeeping or managing your Housekeeping? N    Patient Care Team: [name], NP as PCP - General (Nurse Practitioner) Michele Richardson, DO as PCP - Cardiology (Cardiology) Livingston Rigg, MD as Consulting Physician (Dermatology)  I have updated your Care Teams any recent Medical Services you may have received from other providers in the past year.     Assessment:   This is a routine wellness examination for William Black.  Hearing/Vision screen Hearing Screening - Comments:: Patient wears a hearing aid  Vision Screening - Comments:: No difficulties with vision   Goals Addressed             This Visit's Progress    Patient Stated         Depression Screen     11/16/2023    2:02 PM 09/25/2023   10:08 AM 07/11/2023    9:16 AM 06/01/2023   11:19 AM  PHQ 2/9 Scores  PHQ - 2 Score 0 2 0 0  PHQ- 9 Score 0 3 0 0    Fall Risk     11/16/2023    2:01 PM 09/25/2023   10:08 AM 07/11/2023    9:16 AM 06/01/2023   11:09 AM  Fall Risk   Falls in the past year? 0 0 0 0  Number falls in past yr: 0 0 0 0  Injury with Fall? 0 0 0 0  Risk for fall due to : No Fall Risks No Fall Risks No Fall Risks No Fall Risks  Follow up Falls evaluation completed Falls evaluation completed Falls evaluation completed Falls evaluation completed    MEDICARE RISK AT HOME:  Medicare Risk  at Home Any stairs in or  around the home?: Yes If so, are there any without handrails?: No Home free of loose throw rugs in walkways, pet beds, electrical cords, etc?: Yes Adequate lighting in your home to reduce risk of falls?: Yes Life alert?: No Use of a cane, walker or w/c?: No Grab bars in the bathroom?: Yes Shower chair or bench in shower?: Yes Elevated toilet seat or a handicapped toilet?: Yes  TIMED UP AND GO:  Was the test performed?  No  Cognitive Function: 6CIT completed        Immunizations Immunization History  Administered Date(s) Administered   DTaP 01/17/2012, 02/27/2017   INFLUENZA, HIGH DOSE SEASONAL PF 12/24/2012   Influenza, Mdck, Trivalent,PF 6+ MOS(egg free) 01/05/2015   Influenza, Quadrivalent, Recombinant, Inj, Pf 12/09/2016, 11/24/2017, 11/08/2018, 12/12/2019, 12/22/2020, 12/01/2022   Influenza,trivalent, recombinat, inj, PF 12/01/2022   MMR 08/29/2000   PFIZER(Purple Top)SARS-COV-2 Vaccination 04/08/2019, 04/29/2019   PNEUMOCOCCAL CONJUGATE-20 11/23/2021   Pneumococcal Conjugate-13 10/07/2014   Pneumococcal Polysaccharide-23 12/30/2008, 07/01/2016   Td 12/04/2001   Tdap 02/27/2017   Zoster Recombinant(Shingrix) 06/03/2021, 08/17/2021    Screening Tests Health Maintenance  Topic Date Due   Lung Cancer Screening  05/25/2021   COVID-19 Vaccine (3 - 2025-26 season) 11/13/2023   INFLUENZA VACCINE  06/11/2024 (Originally 10/13/2023)   Medicare Annual Wellness (AWV)  11/15/2024   DTaP/Tdap/Td (5 - Td or Tdap) 02/28/2027   Pneumococcal Vaccine: 50+ Years  Completed   Zoster Vaccines- Shingrix  Completed   HPV VACCINES  Aged Out   Meningococcal B Vaccine  Aged Out   Colonoscopy  Discontinued    Health Maintenance  Health Maintenance Due  Topic Date Due   Lung Cancer Screening  05/25/2021   COVID-19 Vaccine (3 - 2025-26 season) 11/13/2023   Health Maintenance Items Addressed:   Additional Screening:  Vision Screening: Recommended annual ophthalmology exams for  early detection of glaucoma and other disorders of the eye. Would you like a referral to an eye doctor? No    Dental Screening: Recommended annual dental exams for proper oral hygiene  Community Resource Referral / Chronic Care Management: CRR required this visit?  No   CCM required this visit?  No   Plan:    I have personally reviewed and noted the following in the patient's chart:   Medical and social history Use of alcohol, tobacco or illicit drugs  Current medications and supplements including opioid prescriptions. Patient is not currently taking opioid prescriptions. Functional ability and status Nutritional status Physical activity Advanced directives List of other physicians Hospitalizations, surgeries, and ER visits in previous 12 months Vitals Screenings to include cognitive, depression, and falls Referrals and appointments  In addition, I have reviewed and discussed with patient certain preventive protocols, quality metrics, and best practice recommendations. A written personalized care plan for preventive services as well as general preventive health recommendations were provided to patient.   Lyle MARLA Right, [place]   11/16/2023   After Visit Summary: (MyChart) Due to this being a telephonic visit, the after visit summary with patients personalized plan was offered to patient via MyChart   Notes: Nothing significant to report at this time.

## 2023-11-20 DIAGNOSIS — R351 Nocturia: Secondary | ICD-10-CM | POA: Diagnosis not present

## 2023-11-20 DIAGNOSIS — Z125 Encounter for screening for malignant neoplasm of prostate: Secondary | ICD-10-CM | POA: Diagnosis not present

## 2023-11-20 DIAGNOSIS — N430 Encysted hydrocele: Secondary | ICD-10-CM | POA: Diagnosis not present

## 2023-11-29 ENCOUNTER — Encounter: Payer: Self-pay | Admitting: *Deleted

## 2023-11-29 ENCOUNTER — Telehealth: Payer: Self-pay | Admitting: Nurse Practitioner

## 2023-11-29 NOTE — Telephone Encounter (Signed)
 Copied from CRM #[id number]. Topic: Referral - Status >> Nov 29, 2023 11:50 AM Thersia BROCKS wrote: Reason for CRM: [facility] from [facility] called in regarding patient referral stated they received it but health insurance is out of network with them will have to pay out of pocket for services

## 2023-11-29 NOTE — Telephone Encounter (Signed)
 Called and spoke with patient.  He would like referral sent to another clinic.  He cannot afford to pay out of pocket since this clinic is out of network.

## 2023-12-04 ENCOUNTER — Ambulatory Visit: Admitting: Nurse Practitioner

## 2023-12-04 ENCOUNTER — Telehealth: Payer: Self-pay | Admitting: Nurse Practitioner

## 2023-12-04 VITALS — BP 118/78 | HR 78 | Temp 97.5°F | Ht 68.0 in | Wt 142.8 lb

## 2023-12-04 DIAGNOSIS — Z23 Encounter for immunization: Secondary | ICD-10-CM

## 2023-12-04 DIAGNOSIS — R519 Headache, unspecified: Secondary | ICD-10-CM | POA: Diagnosis not present

## 2023-12-04 DIAGNOSIS — I10 Essential (primary) hypertension: Secondary | ICD-10-CM

## 2023-12-04 LAB — BASIC METABOLIC PANEL WITH GFR
BUN: 13 mg/dL (ref 6–23)
CO2: 26 meq/L (ref 19–32)
Calcium: 9.7 mg/dL (ref 8.4–10.5)
Chloride: 104 meq/L (ref 96–112)
Creatinine, Ser: 0.98 mg/dL (ref 0.40–1.50)
GFR: 72.62 mL/min (ref >60.00)
Glucose, Bld: 112 mg/dL — ABNORMAL HIGH (ref 70–99)
Potassium: 4.4 meq/L (ref 3.5–5.1)
Sodium: 138 meq/L (ref 135–145)

## 2023-12-04 LAB — CBC
HCT: 48.7 % (ref 39.0–52.0)
Hemoglobin: 16.3 g/dL (ref 13.0–17.0)
MCHC: 33.5 g/dL (ref 30.0–36.0)
MCV: 97.5 fl (ref 78.0–100.0)
Platelets: 160 K/uL (ref 150.0–400.0)
RBC: 4.99 Mil/uL (ref 4.22–5.81)
RDW: 13.8 % (ref 11.5–15.5)
WBC: 6.3 K/uL (ref 4.0–10.5)

## 2023-12-04 LAB — HIGH SENSITIVITY CRP: CRP, High Sensitivity: 0.43 mg/L (ref 0.000–5.000)

## 2023-12-04 LAB — SEDIMENTATION RATE: Sed Rate: 6 mm/h (ref 0–20)

## 2023-12-04 NOTE — Addendum Note (Signed)
 Addended by: [name] on: 12/04/2023 03:28 PM   Modules accepted: Orders

## 2023-12-04 NOTE — Patient Instructions (Signed)
 William Black [address] [place] [address] [phone]  We did update your flu vaccine today Follow up with me in 6 months

## 2023-12-04 NOTE — Telephone Encounter (Signed)
 Copied from CRM #[id number]. Topic: Referral - Question >> Dec 04, 2023  1:41 PM Terri G wrote: Reason for CRM: Patient wife stated that the headache wellness center is not in network and can Dr.Cable refer husband to someone who is in network. Callback number [phone]

## 2023-12-04 NOTE — Progress Notes (Signed)
 Established Patient Office Visit  Subjective   Patient ID: William Black, male    DOB: September 17, 1940  Age: 83 y.o. MRN: [id number]  Chief Complaint  Patient presents with   Follow-up    Pt complains of ongoing headaches.     HPI  Discussed the use of AI scribe software for clinical note transcription with the patient, who gave verbal consent to proceed.  History of Present Illness William Black is an 83 year old male with chronic headaches who presents for evaluation of persistent headaches and hypertension.  He has experienced chronic headaches for months, with a notable increase in frequency during the summer months when temperatures rise into the 90s and humidity is high. The headaches typically start in the front of the head, above the eyes, and then move over the head and down the neck. Recently, they have begun at the back of the head and spread in both directions. He experiences these headaches several times a week, with each episode potentially lasting all day if not medicated. The headaches are described as having intermittent 'lightning strike' sensations that are quick but can be constant with intermittent stabbing pain.  He has a history of sinus issues, including sinusitis and postnasal drip, which he has dealt with all his life. Cold weather and high-pressure systems have historically triggered headaches, which he associates with his sinus problems. He has not seen an ear, nose, and throat specialist recently but did so years ago.  For headache management, he previously used ibuprofen , sometimes taking it frequently, but switched to Tylenol  after being told that ibuprofen  could cause headaches. Currently, he takes Tylenol  as needed, usually one per day, which seems to help alleviate the headaches. Physical activity sometimes helps reduce the headache intensity.  No changes in vision, weakness, numbness, or tingling associated with the headaches. However, he reports  increased light sensitivity, particularly when exposed to bright sunlight reflecting off surfaces, this is outside of the headache episodes.  In the past, he underwent a  MRI in 2011 due to hearing loss in the right ear, which revealed no tumors. He experiences constant ringing in the right ear (tinnitus) and has tried various treatments without success. He also reports having persistent 'earworms' or songs stuck in his head, which he finds bothersome.  He has a history of hypertension, diagnosed in his twenties, and has been on blood pressure medication since then. He recalls an episode of severe headache and epistaxis related to high blood pressure in the past.    Review of Systems  Constitutional:  Negative for chills and fever.  Respiratory:  Negative for shortness of breath.   Cardiovascular:  Negative for chest pain.  Neurological:  Positive for headaches. Negative for dizziness.  Psychiatric/Behavioral:  Negative for hallucinations and suicidal ideas.       Objective:     BP 118/78   Pulse 78   Temp (!) 97.5 F (36.4 C) (Oral)   Ht 5' 8 (1.727 m)   Wt 142 lb 12.8 oz (64.8 kg)   SpO2 97%   BMI 21.71 kg/m    Physical Exam Vitals and nursing note reviewed.  Constitutional:      Appearance: Normal appearance.  HENT:     Mouth/Throat:     Mouth: Mucous membranes are moist.     Pharynx: Oropharynx is clear.  Eyes:     Extraocular Movements: Extraocular movements intact.     Pupils: Pupils are equal, round, and reactive to light.  Cardiovascular:  Rate and Rhythm: Normal rate and regular rhythm.     Heart sounds: Normal heart sounds.  Pulmonary:     Effort: Pulmonary effort is normal.     Breath sounds: Normal breath sounds.  Neurological:     General: No focal deficit present.     Mental Status: He is alert.     Deep Tendon Reflexes:     Reflex Scores:      Bicep reflexes are 2+ on the right side and 2+ on the left side.      Patellar reflexes are 2+ on the  right side and 2+ on the left side.    Comments: Bilateral upper and lower extremity strength 5/5      No results found for any visits on 12/04/23.    The ASCVD Risk score (Arnett DK, et al., 2019) failed to calculate for the following reasons:   The 2019 ASCVD risk score is only valid for ages 76 to 50    Assessment & Plan:   Problem List Items Addressed This Visit       Cardiovascular and Mediastinum   HTN (hypertension)   Relevant Orders   CBC   Basic metabolic panel with GFR   Other Visit Diagnoses       Frequent headaches    -  Primary   Relevant Orders   Sedimentation rate   High sensitivity CRP     Need for influenza vaccination       Relevant Orders   Flu vaccine HIGH DOSE PF(Fluzone Trivalent) (Completed)      Assessment and Plan Assessment & Plan Chronic headaches and suspected occipital neuralgia Chronic headaches with occipital pain and lightning-like sensations suggestive of occipital neuralgia. No significant neurological deficits. Previous imaging negative for tumors. Light sensitivity present. Tylenol  provides some relief; ibuprofen  overuse previously caused rebound headaches. - Refer to headache specialist (Dr. Oneita) for further evaluation and management. - Advise to confirm insurance coverage and appointment availability with the headache clinic. - Continue using Tylenol  as needed, but limit to avoid medication overuse headaches.  History of sinusitis with postnasal drip Long-standing history of sinusitis with postnasal drip likely contributing to headaches. Previous ENT evaluation confirmed sinusitis. - Continue using nasal spray at bedtime for symptom relief.  Tinnitus and right-sided sensorineural hearing loss Chronic tinnitus and right-sided sensorineural hearing loss since 2011. Previous imaging ruled out tumors. Tinnitus persists despite treatments. Hearing aids used, but only one is tolerated. - No new interventions planned for tinnitus  or hearing loss at this time.  Hypertension Hypertension well-controlled with current medication. Blood pressure within normal range. - Continue current hypertension management with Diltiazem  (tiazac ) 360 MG  daily and lisinopril  40mg  daily.   Return in about 6 months (around 06/02/2024) for CPE and Labs.    Adina Crandall, NP

## 2023-12-04 NOTE — Telephone Encounter (Signed)
 Referral placed to [facility]

## 2023-12-05 ENCOUNTER — Encounter: Payer: Self-pay | Admitting: Neurology

## 2023-12-06 ENCOUNTER — Ambulatory Visit: Payer: Self-pay | Admitting: Nurse Practitioner

## 2023-12-06 NOTE — Telephone Encounter (Signed)
 There is not another Headache Clinic in [place] that I am aware of.   He may want to check with is insurance on this location to see if they are in Network.   [facility] has a [facility] located in [place], [place]  [facility] - [facility], [address]th Floor [place], [place] [address] Phone: [phone] Fax: [phone]   Providers:  Reyes NOVAK. Jerrell, PhD Derinda Sor, MD [place] Kathlyne Dixons, MD, MPH Heron CROME. Isidoro, MD Kristyn M. Burma, MD Corean PARAS. Milissa, NP  If in Network we can send his referral to this location.

## 2023-12-10 ENCOUNTER — Other Ambulatory Visit: Payer: Self-pay | Admitting: Nurse Practitioner

## 2023-12-13 NOTE — Telephone Encounter (Signed)
 Mychart message sent to patient requesting that he let us  know who is in network from list provided.

## 2024-01-24 DIAGNOSIS — D2272 Melanocytic nevi of left lower limb, including hip: Secondary | ICD-10-CM | POA: Diagnosis not present

## 2024-01-24 DIAGNOSIS — D2261 Melanocytic nevi of right upper limb, including shoulder: Secondary | ICD-10-CM | POA: Diagnosis not present

## 2024-01-24 DIAGNOSIS — D225 Melanocytic nevi of trunk: Secondary | ICD-10-CM | POA: Diagnosis not present

## 2024-01-24 DIAGNOSIS — L728 Other follicular cysts of the skin and subcutaneous tissue: Secondary | ICD-10-CM | POA: Diagnosis not present

## 2024-01-24 DIAGNOSIS — D2262 Melanocytic nevi of left upper limb, including shoulder: Secondary | ICD-10-CM | POA: Diagnosis not present

## 2024-01-24 DIAGNOSIS — L821 Other seborrheic keratosis: Secondary | ICD-10-CM | POA: Diagnosis not present

## 2024-01-24 DIAGNOSIS — D2271 Melanocytic nevi of right lower limb, including hip: Secondary | ICD-10-CM | POA: Diagnosis not present

## 2024-01-31 NOTE — Progress Notes (Unsigned)
 Initial neurology clinic note  William Black MRN: [id number] DOB: 01-22-1941  Referring provider: [name], NP  Primary care provider: [name], NP  Reason for consult:  headaches  Subjective:  This is William Black, a 83 y.o. right-handed male with a medical history of HLD, HTN, pre-DM, nephrolithiasis, hearing loss in right ear who presents to neurology clinic with headaches. The patient is accompanied by wife.  Patient's symptoms started early in the spring when he the weather got hot and humid. He had a history of sinus headaches with cold weather. This was a little different. He has a shooting pain in the back of his left head (around the part of his hair in the back). He thinks it would last hours generally. Initially he was taking ibuprofen  1-3 times per day in early summer because that was the frequency of the pain. The pain would be gone with the ibuprofen . He heard ibuprofen  causes more headaches, so he switched to tylenol . Since the fall started, the headaches backed off. He only takes tylenol  maybe once a week sometimes not at all in a week. Since fall, he can go weeks without headaches. Wife thinks stopping the ibuprofen  helped. He has noticed headaches can also respond well to eating.   He can feel different on that side of the head, but cannot really cause shooting pain. Sometimes laying his head on the pillow, his head pain will disappear.  When he is stressed and his BP goes up, like it did parking the car in our parking lot, he is more likely to get a headache.  The pain does seem to go down his neck as well.  Smoker: current smoker; states he is addicted and not interested in quitting Caffiene use: very rare EtOH use: very rare, twice a month Restrictive diet: no Family history of neurologic disease including headaches: daughter with migraines  MEDICATIONS:  Outpatient Encounter Medications as of 02/01/2024  Medication Sig   atorvastatin   (LIPITOR) 20 MG tablet Take 1 tablet (20 mg total) by mouth at bedtime.   diltiazem  (CARDIZEM  CD) 360 MG 24 hr capsule Take 1 capsule by mouth once daily   lisinopril  (ZESTRIL ) 40 MG tablet Take 1 tablet by mouth once daily   Multiple Vitamins-Minerals (MENS 50+ MULTI VITAMIN/MIN PO) Take by mouth daily.   No facility-administered encounter medications on file as of 02/01/2024.    PAST MEDICAL HISTORY: Past Medical History:  Diagnosis Date   Deafness in right ear    High cholesterol    Hyperlipemia 10/30/2018   Hypertension    Kidney stones    Kidney stones     PAST SURGICAL HISTORY: Past Surgical History:  Procedure Laterality Date   APPENDECTOMY     CHOLECYSTECTOMY N/A 01/29/2018   Procedure: LAPAROSCOPIC CHOLECYSTECTOMY WITH INTRAOPERATIVE CHOLANGIOGRAM;  Surgeon: Belinda Cough, MD;  Location: [facility] OR;  Service: General;  Laterality: N/A;   CYST EXCISION     testicular   CYSTOSCOPY     DENTAL SURGERY     ERCP N/A 01/30/2018   Procedure: ENDOSCOPIC RETROGRADE CHOLANGIOPANCREATOGRAPHY (ERCP);  Surgeon: Burnette Fallow, MD;  Location: [facility] ENDOSCOPY;  Service: Endoscopy;  Laterality: N/A;   INGUINAL HERNIA REPAIR Left 03/22/2018   Procedure: LEFT INGUINAL HERNIA REPAIR ERAS PATHWAY;  Surgeon: Belinda Cough, MD;  Location: [facility];  Service: General;  Laterality: Left;   INSERTION OF MESH Left 03/22/2018   Procedure: INSERTION OF MESH;  Surgeon: Belinda Cough, MD;  Location: [facility];  Service: General;  Laterality: Left;   REMOVAL OF STONES  01/30/2018   Procedure: REMOVAL OF STONES;  Surgeon: Burnette Fallow, MD;  Location: [facility] ENDOSCOPY;  Service: Endoscopy;;   SPHINCTEROTOMY  01/30/2018   Procedure: ANNETT;  Surgeon: Burnette Fallow, MD;  Location: [facility];  Service: Endoscopy;;    ALLERGIES: No Known Allergies  FAMILY HISTORY: Family History  Problem Relation Age of Onset   Alcohol abuse Mother     SOCIAL  HISTORY: Social History   Tobacco Use   Smoking status: Every Day    Current packs/day: 0.50    Average packs/day: 0.5 packs/day for 50.0 years (25.0 ttl pk-yrs)    Types: Cigarettes   Smokeless tobacco: Never   Tobacco comments:    1/2 pack a day  Vaping Use   Vaping status: Never Used  Substance Use Topics   Alcohol use: Not Currently    Comment: occ   Drug use: No   Social History   Social History Narrative   Retired He use to work for [employer] (53)   Are you right handed or left handed? right   Are you currently employed ?    What is your current occupation? retired   Do you live at home alone?   Who lives with you? wife   What type of home do you live in: 1 story or 2 story? one   Caffiene one can a week     Objective:  Vital Signs:  BP 126/76   Pulse 73   Ht 5' 11 (1.803 m)   Wt 146 lb (66.2 kg)   SpO2 91%   BMI 20.36 kg/m   General: No acute distress.  Patient appears well-groomed.   Head:  Normocephalic/atraumatic Eyes:  fundi examined, disc margins clear, no obvious papilledema Neck: supple, positive for left >> right paraspinal tenderness, reduced range of motion Heart: regular rate and rhythm Lungs: Clear to auscultation bilaterally. Vascular: No carotid bruits.  Neurological Exam: Mental status: alert and oriented, speech fluent and not dysarthric, language intact.  Cranial nerves: CN I: not tested CN II: pupils equal, round and reactive to light, visual fields intact CN III, IV, VI:  full range of motion, no nystagmus, no ptosis CN V: facial sensation intact. CN VII: upper and lower face symmetric CN VIII: hearing intact CN IX, X: uvula midline CN XI: sternocleidomastoid and trapezius muscles intact CN XII: tongue midline  Bulk & Tone: normal. Motor:  muscle strength 5/5 throughout Deep Tendon Reflexes:  2+ throughout, except absent at ankles.   Sensation:  Light touch sensation intact. Finger to nose  testing:  Without dysmetria.   Gait:  Normal station and stride.    Labs and Imaging review: Internal labs: 12/04/23: CRP wnl ESR wnl BMP significant for glucose 112 CBC significant for MCV 97.5  06/01/23: TSH wnl Lipid panel: tChol 100, LDL 31, TG 80.0  HbA1c (06/20/22): 6.0  Imaging/Procedures: MRI brain w/wo contrast (07/03/2009): Findings: Ventricle size is normal.  Negative for acute infarct.  Small deep white matter hyperintensities on the left may be due to  chronic ischemia.  Brainstem and posterior fossa are normal.    Thin sections were obtained the posterior fossa before and after  intravenous contrast.  The seventh and eighth cranial nerves are  normal.  There is no mass lesion.  The mastoid sinuses are clear.  No evidence of cholesteatoma.  There is further concern of  cholesteatoma, CT  temporal bone would  be more sensitive.  The  paranasal sinuses are clear.    Following contrast infusion, there is a prominent enhancing vein in  the right high parietal lobe compatible with a small venous  angioma.    IMPRESSION:  No acute intracranial abnormality.  No cause for hearing loss is  identified.   Assessment/Plan:  William Black is a 83 y.o. male who presents for evaluation of left posterior head and neck pain. He has a relevant medical history of HLD, HTN, pre-DM, nephrolithiasis, hearing loss in right ear. His neurological examination is essentially normal today. He does have significant tightness of his neck, left worse than right, with very restricted range of motion of the neck. I was not able to cause shooting pain by palpating the area of occipital nerve today. Available diagnostic data is significant for normal ESR and CRP. The etiology of patient's headaches could be due to cervicalgia and/or occipital neuralgia. Patient does not like needles as would be the initial treatment of ON, so he prefers to start with PT for neck to see if this helps symptoms.  Fortunately, his headache has improved somewhat since weather changed in the fall.  PLAN: -Physical therapy for cervicalgia -Could consider occipital nerve block if not responding to PT -Limit use of pain relievers to no more than 2 days out of week to prevent risk of rebound or medication-overuse headache. -Keep headache diary  -Return to clinic in 4-5 months  The impression above as well as the plan as outlined below were extensively discussed with the patient (in the company of wife) who voiced understanding. All questions were answered to their satisfaction.  When available, results of the above investigations and possible further recommendations will be communicated to the patient via telephone/MyChart. Patient to call office if not contacted after expected testing turnaround time.   Total time spent reviewing records, interview, history/exam, documentation, and coordination of care on day of encounter:  55 min   Thank you for allowing me to participate in patient's care.  If I can answer any additional questions, I would be pleased to do so.  Venetia Potters, MD   CC: [name], NP [address] [place] [address]  CC: Referring provider: [name], NP [address] [place],  [place] [address]

## 2024-02-01 ENCOUNTER — Encounter: Payer: Self-pay | Admitting: Neurology

## 2024-02-01 ENCOUNTER — Ambulatory Visit: Admitting: Neurology

## 2024-02-01 VITALS — BP 126/76 | HR 73 | Ht 71.0 in | Wt 146.0 lb

## 2024-02-01 DIAGNOSIS — M5481 Occipital neuralgia: Secondary | ICD-10-CM

## 2024-02-01 DIAGNOSIS — M542 Cervicalgia: Secondary | ICD-10-CM | POA: Diagnosis not present

## 2024-02-01 NOTE — Patient Instructions (Addendum)
 I saw you today for left posterior head and neck pain.  This may be from tightness of your neck pulling on your head causing pain and/or a nerve in the back of your head being irritated (occipital neuralgia).  I would like to start with physical therapy for your neck. This often helps with headaches.  We could consider nerve block of the occipital nerve if physical therapy does not given relief.  Limit use of pain relievers to no more than 2 days out of week to prevent risk of rebound or medication-overuse headache.  Keep headache diary  I will follow up with you in about 4-5 months.   Please let me know if you have any questions or concerns in the meantime.  The physicians and staff at [facility] are committed to providing excellent care. You may receive a survey requesting feedback about your experience at our office. We strive to receive very good responses to the survey questions. If you feel that your experience would prevent you from giving the office a very good  response, please contact our office to try to remedy the situation. We may be reached at [phone]. Thank you for taking the time out of your busy day to complete the survey.  Venetia Potters, MD [facility]

## 2024-02-12 ENCOUNTER — Other Ambulatory Visit: Payer: Self-pay | Admitting: Nurse Practitioner

## 2024-02-15 ENCOUNTER — Encounter (HOSPITAL_BASED_OUTPATIENT_CLINIC_OR_DEPARTMENT_OTHER): Payer: Self-pay | Admitting: Surgery

## 2024-03-01 ENCOUNTER — Ambulatory Visit: Admitting: Podiatry

## 2024-03-01 DIAGNOSIS — M7672 Peroneal tendinitis, left leg: Secondary | ICD-10-CM

## 2024-03-01 NOTE — Progress Notes (Unsigned)
 Left peroneal peronitis CCAM boot

## 2024-03-11 ENCOUNTER — Ambulatory Visit: Payer: Self-pay

## 2024-03-11 NOTE — Telephone Encounter (Signed)
 noted

## 2024-03-11 NOTE — Telephone Encounter (Signed)
 FYI Only or Action Required?: FYI only for provider: appointment scheduled on 03/13/24.  Patient was last seen in primary care on 12/04/2023 by [name], NP.  Called Nurse Triage reporting Cough.  Symptoms began several days ago.  Interventions attempted: OTC medications: Coricidin .  Symptoms are: unchanged.  Triage Disposition: See PCP When Office is Open (Within 3 Days) (overriding Home Care)  Patient/caregiver understands and will follow disposition?: Yes Reason for Disposition  Cough with cold symptoms (e.g., runny nose, postnasal drip, throat clearing)  Answer Assessment - Initial Assessment Questions Patient's wife on the phone with patient today. Taking Coricidin, and left over cough syrup from previous sickness in July. Sleeps great through the night and wakes up and its worse, minor wheezing then subsides throughout the day. Patient's wife mentioned wanting to be seen sooner, advised UC, denied. Scheduled next available at PCP office.    1. ONSET: When did the cough begin?      4-5 days ago  2. SEVERITY: How bad is the cough today?      Worsening  3. SPUTUM: Describe the color of your sputum (e.g., none, dry cough; clear, white, yellow, green)     Sometimes, clear  4. HEMOPTYSIS: Are you coughing up any blood? If Yes, ask: How much? (e.g., flecks, streaks, tablespoons, etc.)     Denies  5. DIFFICULTY BREATHING: Are you having difficulty breathing? If Yes, ask: How bad is it? (e.g., mild, moderate, severe)      Denies  6. FEVER: Do you have a fever? If Yes, ask: What is your temperature, how was it measured, and when did it start?     99.1  7. CARDIAC HISTORY: Do you have any history of heart disease? (e.g., heart attack, congestive heart failure)      PVC  8. LUNG HISTORY: Do you have any history of lung disease?  (e.g., pulmonary embolus, asthma, emphysema)     Was told had COPD and then there was no mention of it, does not use inhalers  or nebulizer solutions  9. PE RISK FACTORS: Do you have a history of blood clots? (or: recent major surgery, recent prolonged travel, bedridden)     Denies  10. OTHER SYMPTOMS: Do you have any other symptoms? (e.g., runny nose, wheezing, chest pain)       Body aches, chest and head congestion, runny nose, sneezing.  Protocols used: Cough - Acute Productive-A-AH  Copied from CRM #[id number]. Topic: Clinical - Red Word Triage >> Mar 11, 2024 11:14 AM Drema MATSU wrote: Red Word that prompted transfer to Nurse Triage: Patient started out with congestiobn on Saturday and now it has gotten worse.  Symptoms: coughing and chest/head congestion, short of breath, and slight temp of 99.1

## 2024-03-11 NOTE — Telephone Encounter (Signed)
 Will see patient then Agree with ER and UC precautions

## 2024-03-13 ENCOUNTER — Encounter: Payer: Self-pay | Admitting: Family Medicine

## 2024-03-13 ENCOUNTER — Ambulatory Visit: Payer: Self-pay | Admitting: Family Medicine

## 2024-03-13 ENCOUNTER — Ambulatory Visit: Admitting: Neurology

## 2024-03-13 ENCOUNTER — Other Ambulatory Visit: Payer: Self-pay

## 2024-03-13 ENCOUNTER — Emergency Department (HOSPITAL_COMMUNITY)

## 2024-03-13 ENCOUNTER — Encounter (HOSPITAL_COMMUNITY): Payer: Self-pay | Admitting: Hospitalist

## 2024-03-13 ENCOUNTER — Observation Stay (HOSPITAL_COMMUNITY)
Admission: EM | Admit: 2024-03-13 | Discharge: 2024-03-14 | Disposition: A | Discharge status: Home / Self Care | Attending: Internal Medicine | Admitting: Internal Medicine

## 2024-03-13 ENCOUNTER — Ambulatory Visit (INDEPENDENT_AMBULATORY_CARE_PROVIDER_SITE_OTHER)
Admission: RE | Admit: 2024-03-13 | Discharge: 2024-03-13 | Disposition: A | Discharge status: Home / Self Care | Source: Ambulatory Visit | Attending: Family Medicine | Admitting: Family Medicine

## 2024-03-13 ENCOUNTER — Ambulatory Visit: Admitting: Family Medicine

## 2024-03-13 VITALS — BP 110/68 | HR 78 | Temp 97.9°F | Ht 71.0 in

## 2024-03-13 DIAGNOSIS — J210 Acute bronchiolitis due to respiratory syncytial virus: Principal | ICD-10-CM | POA: Insufficient documentation

## 2024-03-13 DIAGNOSIS — F17200 Nicotine dependence, unspecified, uncomplicated: Secondary | ICD-10-CM | POA: Diagnosis not present

## 2024-03-13 DIAGNOSIS — R0602 Shortness of breath: Secondary | ICD-10-CM | POA: Insufficient documentation

## 2024-03-13 DIAGNOSIS — F17210 Nicotine dependence, cigarettes, uncomplicated: Secondary | ICD-10-CM | POA: Insufficient documentation

## 2024-03-13 DIAGNOSIS — J069 Acute upper respiratory infection, unspecified: Secondary | ICD-10-CM

## 2024-03-13 DIAGNOSIS — Z79899 Other long term (current) drug therapy: Secondary | ICD-10-CM | POA: Insufficient documentation

## 2024-03-13 DIAGNOSIS — I10 Essential (primary) hypertension: Secondary | ICD-10-CM | POA: Insufficient documentation

## 2024-03-13 DIAGNOSIS — E785 Hyperlipidemia, unspecified: Secondary | ICD-10-CM | POA: Insufficient documentation

## 2024-03-13 DIAGNOSIS — J441 Chronic obstructive pulmonary disease with (acute) exacerbation: Secondary | ICD-10-CM | POA: Insufficient documentation

## 2024-03-13 DIAGNOSIS — J9621 Acute and chronic respiratory failure with hypoxia: Secondary | ICD-10-CM | POA: Diagnosis not present

## 2024-03-13 LAB — CBC
HCT: 48.3 % (ref 39.0–52.0)
HCT: 49.2 % (ref 39.0–52.0)
Hemoglobin: 17.5 g/dL — ABNORMAL HIGH (ref 13.0–17.0)
Hemoglobin: 17.1 g/dL — ABNORMAL HIGH (ref 13.0–17.0)
MCH: 33.7 pg (ref 26.0–34.0)
MCH: 33.1 pg (ref 26.0–34.0)
MCHC: 36.2 g/dL — ABNORMAL HIGH (ref 30.0–36.0)
MCHC: 34.8 g/dL (ref 30.0–36.0)
MCV: 93.1 fL (ref 80.0–100.0)
MCV: 95.3 fL (ref 80.0–100.0)
Platelets: 125 K/uL — ABNORMAL LOW (ref 150–400)
Platelets: 128 K/uL — ABNORMAL LOW (ref 150–400)
RBC: 5.19 MIL/uL (ref 4.22–5.81)
RBC: 5.16 MIL/uL (ref 4.22–5.81)
RDW: 13.7 % (ref 11.5–15.5)
RDW: 13.9 % (ref 11.5–15.5)
WBC: 9.7 K/uL (ref 4.0–10.5)
WBC: 9.3 K/uL (ref 4.0–10.5)
nRBC: 0 % (ref 0.0–0.2)
nRBC: 0 % (ref 0.0–0.2)

## 2024-03-13 LAB — BASIC METABOLIC PANEL WITH GFR
Anion gap: 14 (ref 5–15)
BUN: 10 mg/dL (ref 8–23)
CO2: 20 mmol/L — ABNORMAL LOW (ref 22–32)
Calcium: 9.4 mg/dL (ref 8.9–10.3)
Chloride: 102 mmol/L (ref 98–111)
Creatinine, Ser: 0.88 mg/dL (ref 0.61–1.24)
GFR, Estimated: >60 mL/min
Glucose, Bld: 113 mg/dL — ABNORMAL HIGH (ref 70–99)
Potassium: 3.6 mmol/L (ref 3.5–5.1)
Sodium: 136 mmol/L (ref 135–145)

## 2024-03-13 LAB — URINALYSIS, ROUTINE W REFLEX MICROSCOPIC
Bacteria, UA: NONE SEEN
Bilirubin Urine: NEGATIVE
Glucose, UA: NEGATIVE mg/dL
Hgb urine dipstick: NEGATIVE
Ketones, ur: 5 mg/dL — AB
Leukocytes,Ua: NEGATIVE
Nitrite: NEGATIVE
Protein, ur: 30 mg/dL — AB
Specific Gravity, Urine: 1.015 (ref 1.005–1.030)
pH: 5 (ref 5.0–8.0)

## 2024-03-13 LAB — RESP PANEL BY RT-PCR (RSV, FLU A&B, COVID)  RVPGX2
Influenza A by PCR: NEGATIVE
Influenza B by PCR: NEGATIVE
Resp Syncytial Virus by PCR: POSITIVE — AB
SARS Coronavirus 2 by RT PCR: NEGATIVE

## 2024-03-13 LAB — CREATININE, SERUM
Creatinine, Ser: 0.92 mg/dL (ref 0.61–1.24)
GFR, Estimated: >60 mL/min

## 2024-03-13 MED ORDER — ALBUTEROL SULFATE (2.5 MG/3ML) 0.083% IN NEBU
5.0000 mg | INHALATION_SOLUTION | Freq: Once | RESPIRATORY_TRACT | Status: AC
  Administered 2024-03-13: 5 mg via RESPIRATORY_TRACT
  Filled 2024-03-13: qty 6

## 2024-03-13 MED ORDER — ALBUTEROL SULFATE HFA 108 (90 BASE) MCG/ACT IN AERS: 2.0000 | INHALATION_SPRAY | Freq: Four times a day (QID) | RESPIRATORY_TRACT | 0 refills | Status: DC | PRN

## 2024-03-13 MED ORDER — IPRATROPIUM-ALBUTEROL 0.5-2.5 (3) MG/3ML IN SOLN: 3.0000 mL | Freq: Four times a day (QID) | RESPIRATORY_TRACT | Status: DC | PRN

## 2024-03-13 MED ORDER — METHYLPREDNISOLONE SODIUM SUCC 125 MG IJ SOLR
125.0000 mg | Freq: Once | INTRAMUSCULAR | Status: AC
  Administered 2024-03-13: 125 mg via INTRAVENOUS
  Filled 2024-03-13: qty 2

## 2024-03-13 MED ORDER — MONTELUKAST SODIUM 10 MG PO TABS
10.0000 mg | ORAL_TABLET | Freq: Every day | ORAL | Status: DC
  Administered 2024-03-13: 10 mg via ORAL
  Filled 2024-03-13: qty 1

## 2024-03-13 MED ORDER — AMOXICILLIN-POT CLAVULANATE 875-125 MG PO TABS
1.0000 | ORAL_TABLET | Freq: Two times a day (BID) | ORAL | Status: DC
  Administered 2024-03-13 – 2024-03-14 (×3): 1 via ORAL
  Filled 2024-03-13 (×3): qty 1

## 2024-03-13 MED ORDER — ATORVASTATIN CALCIUM 10 MG PO TABS
20.0000 mg | ORAL_TABLET | Freq: Every day | ORAL | Status: DC
  Administered 2024-03-13: 20 mg via ORAL
  Filled 2024-03-13: qty 2

## 2024-03-13 MED ORDER — ENOXAPARIN SODIUM 40 MG/0.4ML IJ SOSY
40.0000 mg | PREFILLED_SYRINGE | INTRAMUSCULAR | Status: DC
  Administered 2024-03-13: 40 mg via SUBCUTANEOUS
  Filled 2024-03-13: qty 0.4

## 2024-03-13 MED ORDER — PREDNISONE 10 MG PO TABS: ORAL_TABLET | ORAL | 0 refills | Status: DC

## 2024-03-13 MED ORDER — DILTIAZEM HCL ER COATED BEADS 120 MG PO CP24
360.0000 mg | ORAL_CAPSULE | Freq: Every day | ORAL | Status: DC
  Administered 2024-03-14: 360 mg via ORAL
  Filled 2024-03-13: qty 3

## 2024-03-13 MED ORDER — AZITHROMYCIN 250 MG PO TABS: ORAL_TABLET | ORAL | 0 refills | Status: DC

## 2024-03-13 MED ORDER — ADULT MULTIVITAMIN W/MINERALS CH
1.0000 | ORAL_TABLET | Freq: Every day | ORAL | Status: DC
  Administered 2024-03-13 – 2024-03-14 (×2): 1 via ORAL
  Filled 2024-03-13 (×2): qty 1

## 2024-03-13 MED ORDER — LISINOPRIL 20 MG PO TABS
40.0000 mg | ORAL_TABLET | Freq: Every day | ORAL | Status: DC
  Administered 2024-03-13 – 2024-03-14 (×2): 40 mg via ORAL
  Filled 2024-03-13 (×2): qty 2

## 2024-03-13 MED ORDER — PREDNISONE 20 MG PO TABS
40.0000 mg | ORAL_TABLET | Freq: Every day | ORAL | Status: DC
  Administered 2024-03-14: 40 mg via ORAL
  Filled 2024-03-13: qty 2

## 2024-03-13 NOTE — H&P (Signed)
 " History and Physical    Patient: William Black FMW:[id number] DOB: 23-Oct-1940 DOA: 03/13/2024 DOS: the patient was seen and examined on 03/13/2024 PCP: [name], NP  Patient coming from: Home  Chief Complaint:  Chief Complaint  Patient presents with   Shortness of Breath   Cough   Nasal Congestion   HPI: William Black is a 83 y.o. male with medical history significant of active smoker, COPD (per imaging; unclear if OP PFTs obtained), HTN, and HLD who p/w AHRF iso RSV.  The patient reported experiencing shortness of breath that began in the morning, prompting the hospital visit. The symptoms started the day before Christmas with a general feeling of malaise as if hit by a truck, accompanied by body aches. The patient developed a cough, sneezing, and a runny nose, which persisted. On Christmas Day, the patient felt unwell throughout the day, with symptoms worsening over time. The patient noted difficulty breathing with minimal exertion, such as walking short distances like from the living room to the bathroom, and even during transfers from the bed to a wheelchair.  In the ED, pt AFVSS. Labs notable for RSV. CXR w/ hyperinflation, but clear. EDP requested admission for observation given desaturation with activity iso RSV infection.   Review of Systems: As mentioned in the history of present illness. All other systems reviewed and are negative. Past Medical History:  Diagnosis Date   Deafness in right ear    High cholesterol    Hyperlipemia 10/30/2018   Hypertension    Kidney stones    Kidney stones    Past Surgical History:  Procedure Laterality Date   APPENDECTOMY     CHOLECYSTECTOMY N/A 01/29/2018   Procedure: LAPAROSCOPIC CHOLECYSTECTOMY WITH INTRAOPERATIVE CHOLANGIOGRAM;  Surgeon: Belinda Cough, MD;  Location: [facility] OR;  Service: General;  Laterality: N/A;   CYST EXCISION     testicular   CYSTOSCOPY     DENTAL SURGERY     ERCP N/A 01/30/2018   Procedure:  ENDOSCOPIC RETROGRADE CHOLANGIOPANCREATOGRAPHY (ERCP);  Surgeon: Burnette Fallow, MD;  Location: [facility] ENDOSCOPY;  Service: Endoscopy;  Laterality: N/A;   INGUINAL HERNIA REPAIR Left 03/22/2018   Procedure: LEFT INGUINAL HERNIA REPAIR ERAS PATHWAY;  Surgeon: Belinda Cough, MD;  Location: [facility];  Service: General;  Laterality: Left;   REMOVAL OF STONES  01/30/2018   Procedure: REMOVAL OF STONES;  Surgeon: Burnette Fallow, MD;  Location: [facility]COPY;  Service: Endoscopy;;   SPHINCTEROTOMY  01/30/2018   Procedure: ANNETT;  Surgeon: Burnette Fallow, MD;  Location: [facility] ENDOSCOPY;  Service: Endoscopy;;   Social History:  reports that he has been smoking cigarettes. He has a 25 pack-year smoking history. He has never used smokeless tobacco. He reports that he does not currently use alcohol. He reports that he does not use drugs.  Allergies[1]  Family History  Problem Relation Age of Onset   Alcohol abuse Mother     Prior to Admission medications  Medication Sig Start Date End Date Taking? Authorizing Provider  albuterol (VENTOLIN HFA) 108 (90 Base) MCG/ACT inhaler Inhale 2 puffs into the lungs every 6 (six) hours as needed for wheezing or shortness of breath. 03/13/24   Tower, Laine LABOR, MD  atorvastatin  (LIPITOR) 20 MG tablet TAKE 1 TABLET BY MOUTH AT BEDTIME 02/12/24   [name], NP  azithromycin (ZITHROMAX) 250 MG tablet Take 2 tablets on day 1, then 1 tablet daily on days 2 through 5 03/13/24 03/18/24  Tower, Laine LABOR, MD  diltiazem  (CARDIZEM   CD) 360 MG 24 hr capsule Take 1 capsule by mouth once daily 12/11/23   [name], NP  lisinopril  (ZESTRIL ) 40 MG tablet Take 1 tablet by mouth once daily 12/11/23   [name], NP  Multiple Vitamins-Minerals (MENS 50+ MULTI VITAMIN/MIN PO) Take by mouth daily.    [provider]  predniSONE  (DELTASONE ) 10 MG tablet Take 4 pills once daily by mouth for 3 days, then 3 pills daily for 3 days, then 2 pills daily for 3  days then 1 pill daily for 3 days then stop 03/13/24   Tower, Laine LABOR, MD    Physical Exam: Vitals:   03/13/24 0920 03/13/24 1133 03/13/24 1215  BP: 136/67  120/86  Pulse: 63  80  Resp: 18  18  Temp: 97.8 F (36.6 C)    SpO2: 92% 96% 94%   General: Alert, oriented x3, resting comfortably in no acute distress Respiratory: Lungs clear to auscultation bilaterally with normal respiratory effort; no w/r/r Cardiovascular: Regular rate and rhythm w/o m/r/g Abdomen: Soft, nontender, nondistended. Positive bowel sounds   Data Reviewed:  Lab Results  Component Value Date   WBC 9.7 03/13/2024   HGB 17.5 (H) 03/13/2024   HCT 48.3 03/13/2024   MCV 93.1 03/13/2024   PLT 125 (L) 03/13/2024   Lab Results  Component Value Date   GLUCOSE 113 (H) 03/13/2024   CALCIUM  9.4 03/13/2024   NA 136 03/13/2024   K 3.6 03/13/2024   CO2 20 (L) 03/13/2024   CL 102 03/13/2024   BUN 10 03/13/2024   CREATININE 0.88 03/13/2024   Lab Results  Component Value Date   ALT 24 07/09/2023   AST 26 07/09/2023   ALKPHOS 50 07/09/2023   BILITOT 0.9 07/09/2023   Lab Results  Component Value Date   INR 1.06 02/27/2017   Radiology: DG Chest Port 1 View Result Date: 03/13/2024 EXAM: 1 VIEW(S) XRAY OF THE CHEST 03/13/2024 11:51:24 AM COMPARISON: 03/13/2024 CLINICAL HISTORY: cough FINDINGS: LUNGS AND PLEURA: Lung hyperinflation. No focal pulmonary opacity. No pleural effusion. No pneumothorax. HEART AND MEDIASTINUM: Atherosclerotic plaque is present. The cardiac and mediastinal silhouettes are otherwise without acute abnormality. BONES AND SOFT TISSUES: No acute osseous abnormality. IMPRESSION: 1. Lungs are hyperinflated but clear. 2. Aortic atherosclerotic calcification. Electronically signed by: Waddell Calk MD 03/13/2024 12:58 PM EST RP Workstation: [id number]   DG Chest 2 View Result Date: 03/13/2024 EXAM: 2 VIEW(S) XRAY OF THE CHEST 03/13/2024 08:49:55 AM COMPARISON: 09/25/2023 CLINICAL HISTORY: smoker  with cough for a week /now short of breath with some wheezing FINDINGS: LUNGS AND PLEURA: Hyperinflation consistent with COPD. No focal pulmonary opacity. No pleural effusion. No pneumothorax. HEART AND MEDIASTINUM: Aortic atherosclerosis. No acute abnormality of the cardiac silhouette. BONES AND SOFT TISSUES: No acute osseous abnormality. IMPRESSION: 1. Hyperinflation consistent with COPD. 2. Aortic atherosclerosis. Electronically signed by: Evalene Coho MD 03/13/2024 09:20 AM EST RP Workstation: [id number]    Assessment and Plan: 12M h/o active smoker, COPD (per imaging; unclear if OP PFTs obtained), HTN, and HLD who p/w AHRF iso RSV.  AHRF iso RSV -Continue conservative mgt -Continue supplemental 02 prn -Wean O2 prior to d/c as tolerated  AECOPD CXR on admission with hyperinflation and CT chest in 2019 mentions COPD as well -PO Augmentin  875mg  BID for 5 days iso AECOPD -PO prednisone  40mg  daily for 5 days iso AECOPD -Duonebs prn -Start montelukast 10mg  at bedtime -Incentive spirometry and flutter valve prn -F/u BNP and diurese prn -Wean O2 as tolerated -  Ambulatory pulse prior to d/c -OP Pulm f/u on d/c; consider Pulm rehab referral as well  HTN -PTA diltiazem  360mg  daily and lisinopril  40mg  daily   Advance Care Planning:   Code Status: Full Code   Consults: N/A  Family Communication: Spouse  Severity of Illness: The appropriate patient status for this patient is INPATIENT. Inpatient status is judged to be reasonable and necessary in order to provide the required intensity of service to ensure the patient's safety. The patient's presenting symptoms, physical exam findings, and initial radiographic and laboratory data in the context of their chronic comorbidities is felt to place them at high risk for further clinical deterioration. Furthermore, it is not anticipated that the patient will be medically stable for discharge from the hospital within 2 midnights of admission.   *  I certify that at the point of admission it is my clinical judgment that the patient will require inpatient hospital care spanning beyond 2 midnights from the point of admission due to high intensity of service, high risk for further deterioration and high frequency of surveillance required.*   ------- I spent 61 minutes reviewing previous notes, at the bedside counseling/discussing the treatment plan, and performing clinical documentation.  Author: Marsha Ada, MD 03/13/2024 1:21 PM  For on call review [email or web address].      [1] No Known Allergies  "

## 2024-03-13 NOTE — ED Provider Notes (Signed)
 " [facility] Provider Note   CSN: [id number] Arrival date & time: 03/13/24  9083     Patient presents with: Shortness of Breath, Cough, and Nasal Congestion   William Black is a 83 y.o. male.   HPI 83 year old male history of smoking, hypertension, influenza like illness presents today with report of hypoxia.  He has had bodyaches, some subjective fever that began on Christmas Day.  In the past several days he has had increasing cough with dyspnea on exertion.  He was seen by his primary care doctor today and noted to have oxygen saturation decreased to 91% and told to come to the ED for admission.    Prior to Admission medications  Medication Sig Start Date End Date Taking? Authorizing Provider  albuterol (VENTOLIN HFA) 108 (90 Base) MCG/ACT inhaler Inhale 2 puffs into the lungs every 6 (six) hours as needed for wheezing or shortness of breath. 03/13/24   Tower, Laine LABOR, MD  atorvastatin  (LIPITOR) 20 MG tablet TAKE 1 TABLET BY MOUTH AT BEDTIME 02/12/24   [name], NP  azithromycin (ZITHROMAX) 250 MG tablet Take 2 tablets on day 1, then 1 tablet daily on days 2 through 5 03/13/24 03/18/24  Tower, Laine LABOR, MD  diltiazem  (CARDIZEM  CD) 360 MG 24 hr capsule Take 1 capsule by mouth once daily 12/11/23   [name], NP  lisinopril  (ZESTRIL ) 40 MG tablet Take 1 tablet by mouth once daily 12/11/23   [name], NP  Multiple Vitamins-Minerals (MENS 50+ MULTI VITAMIN/MIN PO) Take by mouth daily.    [provider]  predniSONE  (DELTASONE ) 10 MG tablet Take 4 pills once daily by mouth for 3 days, then 3 pills daily for 3 days, then 2 pills daily for 3 days then 1 pill daily for 3 days then stop 03/13/24   Tower, Laine LABOR, MD    Allergies: Patient has no known allergies.    Review of Systems  Updated Vital Signs BP 120/86   Pulse 80   Temp 97.8 F (36.6 C)   Resp 18   SpO2 94%   Physical Exam Vitals reviewed.   Constitutional:      General: He is not in acute distress.    Appearance: He is well-developed. He is ill-appearing.  HENT:     Head: Normocephalic.     Mouth/Throat:     Mouth: Mucous membranes are moist.  Eyes:     Pupils: Pupils are equal, round, and reactive to light.  Cardiovascular:     Rate and Rhythm: Normal rate and regular rhythm.  Pulmonary:     Effort: Tachypnea present.     Breath sounds: Examination of the right-lower field reveals wheezing. Examination of the left-lower field reveals wheezing. Wheezing present.  Abdominal:     Palpations: Abdomen is soft.  Musculoskeletal:        General: Normal range of motion.     Cervical back: Normal range of motion.  Skin:    General: Skin is warm.     Capillary Refill: Capillary refill takes less than 2 seconds.  Neurological:     General: No focal deficit present.     Mental Status: He is alert.  Psychiatric:        Mood and Affect: Mood normal.     (all labs ordered are listed, but only abnormal results are displayed) Labs Reviewed  RESP PANEL BY RT-PCR (RSV, FLU A&B, COVID)  RVPGX2 - Abnormal; Notable for  the following components:      Result Value   Resp Syncytial Virus by PCR POSITIVE (*)    All other components within normal limits  BASIC METABOLIC PANEL WITH GFR - Abnormal; Notable for the following components:   CO2 20 (*)    Glucose, Bld 113 (*)    All other components within normal limits  CBC - Abnormal; Notable for the following components:   Hemoglobin 17.5 (*)    MCHC 36.2 (*)    Platelets 125 (*)    All other components within normal limits  URINALYSIS, ROUTINE W REFLEX MICROSCOPIC - Abnormal; Notable for the following components:   APPearance HAZY (*)    Ketones, ur 5 (*)    Protein, ur 30 (*)    All other components within normal limits  CBC  CREATININE, SERUM    EKG: EKG Interpretation Date/Time:  Wednesday March 13 2024 09:36:07 EST Ventricular Rate:  77 PR Interval:  170 QRS  Duration:  78 QT Interval:  406 QTC Calculation: 459 R Axis:   70  Text Interpretation: Sinus rhythm with frequent Premature ventricular complexes Right atrial enlargement Nonspecific ST and T wave abnormality Abnormal ECG Interpretation limited secondary to artifact PVCs, otherwise no significant change from last EKG Confirmed by Ellouise Fine ([id number]) on 03/13/2024 10:21:54 AM  Radiology: [facility] Chest Port 1 View Result Date: 03/13/2024 EXAM: 1 VIEW(S) XRAY OF THE CHEST 03/13/2024 11:51:24 AM COMPARISON: 03/13/2024 CLINICAL HISTORY: cough FINDINGS: LUNGS AND PLEURA: Lung hyperinflation. No focal pulmonary opacity. No pleural effusion. No pneumothorax. HEART AND MEDIASTINUM: Atherosclerotic plaque is present. The cardiac and mediastinal silhouettes are otherwise without acute abnormality. BONES AND SOFT TISSUES: No acute osseous abnormality. IMPRESSION: 1. Lungs are hyperinflated but clear. 2. Aortic atherosclerotic calcification. Electronically signed by: Waddell Calk MD 03/13/2024 12:58 PM EST RP Workstation: [id number]   DG Chest 2 View Result Date: 03/13/2024 EXAM: 2 VIEW(S) XRAY OF THE CHEST 03/13/2024 08:49:55 AM COMPARISON: 09/25/2023 CLINICAL HISTORY: smoker with cough for a week /now short of breath with some wheezing FINDINGS: LUNGS AND PLEURA: Hyperinflation consistent with COPD. No focal pulmonary opacity. No pleural effusion. No pneumothorax. HEART AND MEDIASTINUM: Aortic atherosclerosis. No acute abnormality of the cardiac silhouette. BONES AND SOFT TISSUES: No acute osseous abnormality. IMPRESSION: 1. Hyperinflation consistent with COPD. 2. Aortic atherosclerosis. Electronically signed by: Evalene Coho MD 03/13/2024 09:20 AM EST RP Workstation: [id number]     .Critical Care  Performed by: Levander Houston, MD Authorized by: Levander Houston, MD   Critical care provider statement:    Critical care time (minutes):  30   Critical care end time:  03/13/2024 1:20 PM   Critical care  time was exclusive of:  Separately billable procedures and treating other patients and teaching time   Critical care was time spent personally by me on the following activities:  Development of treatment plan with patient or surrogate, discussions with consultants, evaluation of patient's response to treatment, examination of patient, ordering and review of laboratory studies, ordering and review of radiographic studies, ordering and performing treatments and interventions, pulse oximetry, re-evaluation of patient's condition and review of old charts    Medications Ordered in the ED  albuterol (PROVENTIL) (2.5 MG/3ML) 0.083% nebulizer solution 5 mg (has no administration in time range)  atorvastatin  (LIPITOR) tablet 20 mg (has no administration in time range)  diltiazem  (CARDIZEM  CD) 24 hr capsule 360 mg (has no administration in time range)  lisinopril  (ZESTRIL ) tablet 40 mg (has no administration in time range)  Mens  50+ Multi Vitamin/Min TABS (has no administration in time range)  enoxaparin  (LOVENOX ) injection 40 mg (has no administration in time range)  predniSONE  (DELTASONE ) tablet 40 mg (has no administration in time range)  albuterol (PROVENTIL) (2.5 MG/3ML) 0.083% nebulizer solution 5 mg (5 mg Nebulization Given 03/13/24 1146)  methylPREDNISolone sodium succinate (SOLU-MEDROL) 125 mg/2 mL injection 125 mg (125 mg Intravenous Given 03/13/24 1146)    Clinical Course as of 03/13/24 1321  Wed Mar 13, 2024  1231 RSV positive [DR]  1244 CBC reviewed interpreted and significant for elevated hemoglobin and decreased platelets at 125,000 [DR]    Clinical Course User Index [DR] Levander Houston, MD                                 Medical Decision Making Amount and/or Complexity of Data Reviewed Labs: ordered. Radiology: ordered.  Risk Prescription drug management.   83 year old man history of tobacco use presents today with dyspnea.  Patient has had URI symptoms since Christmas.  He  has had increased shortness of breath over the past several days and is dyspneic with any exertion.  He presented to his primary care office today and sats were low at 90%.  Secondary to this he was sent to the ED.  Here in the ED he is on oxygen with sats 94%.  He has some scattered rhonchi and has expiratory wheezes in his base.  Patient was evaluated here with vital signs that are normal with the exception of the low oxygen saturation.  He has some increased hemoglobin likely secondary to known smoking history platelets are decreased at 125,000 no evidence of bleeding noted.  Review of history shows that he has had low platelets in the past. Basic metabolic panel significant for CO2 decreased 20 otherwise within normal limits Chest x-Lailoni Baquera reviewed with no evidence of acute infiltrates noted Respiratory panel positive for RSV Patient treated here with steroids, nebulizer, 1:21 PM Patient continues to have sats 93% on room air.  He has continued wheezing post neb and Solu-Medrol.  Will place on nasal cannula and give additional nebulizer. Plan consultation to hospitalist for admission for ongoing evaluation and treatment Care discussed with hospitalist team who will see for admission    Final diagnoses:  RSV (acute bronchiolitis due to respiratory syncytial virus)  Acute on chronic respiratory failure with hypoxia [facility])    ED Discharge Orders     None          Levander Houston, MD 03/13/24 1321  "

## 2024-03-13 NOTE — ED Provider Triage Note (Signed)
 Emergency Medicine Provider Triage Evaluation Note  Jayceion Caperton , a 83 y.o. male  was evaluated in triage.  Pt complains of shortness of breath. .Pt saw primary care MD and was sent here  Review of Systems  Positive: cough Negative: fever  Physical Exam  BP 136/67   Pulse 63   Temp 97.8 F (36.6 C)   Resp 18   SpO2 92%  Gen:   Awake, no distress   Resp:  Normal effort  MSK:   Moves extremities without difficulty  Other:    Medical Decision Making  Medically screening exam initiated at 10:31 AM.  Appropriate orders placed.  Abdalrahman Wagar was informed that the remainder of the evaluation will be completed by another provider, this initial triage assessment does not replace that evaluation, and the importance of remaining in the ED until their evaluation is complete.     Flint Sonny POUR, PA-C 03/13/24 1031

## 2024-03-13 NOTE — Assessment & Plan Note (Signed)
 1/2 ppd Last cig this past weekend in setting of illness Suspect copd   Pt is certain that he will not smoke another cigarette and family plans to get them out of the house

## 2024-03-13 NOTE — Progress Notes (Signed)
 "  Subjective:    Patient ID: William Black, male    DOB: Jun 15, 1940, 83 y.o.   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 with  Uri symptoms    Cough Congestion  Runny nose/sneezing Temp of 99.1 when calling -- re checked and normal/error   Flu/covid test negative yesterday at home   Called 911 last night  Vitals were ok  Was told ER wait was 10 hours   Smoking 1/2 ppd  Last cigarette over the weekend   Temp has been normal   Day 8   Achey   Shortness of breath with exertion   Some diarrhea also    Flonase Zyrtec  Guaifenisin 600    Sitting down feels fine   Cxr DG Chest 2 View Result Date: 03/13/2024 EXAM: 2 VIEW(S) XRAY OF THE CHEST 03/13/2024 08:49:55 AM COMPARISON: 09/25/2023 CLINICAL HISTORY: smoker with cough for a week /now short of breath with some wheezing FINDINGS: LUNGS AND PLEURA: Hyperinflation consistent with COPD. No focal pulmonary opacity. No pleural effusion. No pneumothorax. HEART AND MEDIASTINUM: Aortic atherosclerosis. No acute abnormality of the cardiac silhouette. BONES AND SOFT TISSUES: No acute osseous abnormality. IMPRESSION: 1. Hyperinflation consistent with COPD. 2. Aortic atherosclerosis. Electronically signed by: Evalene Coho MD 03/13/2024 09:20 AM EST RP Workstation: [id number]     Patient Active Problem List   Diagnosis Date Noted   Smoker 03/13/2024   SOB (shortness of breath) 03/13/2024   Recurrent headache 10/31/2023   URI with cough and congestion 09/25/2023   Hospital discharge follow-up 07/11/2023   Irregular heartbeat 06/01/2023   HTN (hypertension) 10/30/2018   Hyperlipemia 10/30/2018   Chronic calculous cholecystitis 01/28/2018   Past Medical  History:  Diagnosis Date   Deafness in right ear    High cholesterol    Hyperlipemia 10/30/2018   Hypertension    Kidney stones    Kidney stones    Past Surgical History:  Procedure Laterality Date   APPENDECTOMY     CHOLECYSTECTOMY N/A 01/29/2018   Procedure: LAPAROSCOPIC CHOLECYSTECTOMY WITH INTRAOPERATIVE CHOLANGIOGRAM;  Surgeon: Belinda Cough, MD;  Location: [facility] OR;  Service: General;  Laterality: N/A;   CYST EXCISION     testicular   CYSTOSCOPY     DENTAL SURGERY     ERCP N/A 01/30/2018   Procedure: ENDOSCOPIC RETROGRADE CHOLANGIOPANCREATOGRAPHY (ERCP);  Surgeon: Burnette Fallow, MD;  Location: [facility] ENDOSCOPY;  Service: Endoscopy;  Laterality: N/A;   INGUINAL HERNIA REPAIR Left 03/22/2018   Procedure: LEFT INGUINAL HERNIA REPAIR ERAS PATHWAY;  Surgeon: Belinda Cough, MD;  Location: [facility];  Service: General;  Laterality: Left;   REMOVAL OF STONES  01/30/2018   Procedure: REMOVAL OF STONES;  Surgeon: Burnette Fallow, MD;  Location: [facility] ENDOSCOPY;  Service: Endoscopy;;   SPHINCTEROTOMY  01/30/2018   Procedure: ANNETT;  Surgeon: Burnette Fallow, MD;  Location: [facility] ENDOSCOPY;  Service: Endoscopy;;   Social History[1] Family History  Problem Relation Age of Onset   Alcohol abuse Mother    Allergies[2] Medications Ordered Prior to Encounter[3]  Review of Systems     Objective:   Physical Exam Constitutional:  General: He is not in acute distress.    Appearance: Normal appearance. He is well-developed and normal weight. He is not ill-appearing, toxic-appearing or diaphoretic.     Comments: In wheelchair Speaking full sentences   Frail appearing but overall chipper   HENT:     Head: Normocephalic and atraumatic.     Comments: Nares are injected and congested      Right Ear: Ear canal and external ear normal.     Left Ear: Ear canal and external ear normal.     Ears:     Comments: Hearing aides    Nose: Congestion and rhinorrhea present.      Mouth/Throat:     Mouth: Mucous membranes are moist.     Pharynx: Oropharynx is clear. No oropharyngeal exudate or posterior oropharyngeal erythema.     Comments: Clear pnd  Eyes:     General:        Right eye: No discharge.        Left eye: No discharge.     Conjunctiva/sclera: Conjunctivae normal.     Pupils: Pupils are equal, round, and reactive to light.  Cardiovascular:     Rate and Rhythm: Normal rate.     Heart sounds: Normal heart sounds.  Pulmonary:     Effort: Pulmonary effort is normal. No respiratory distress.     Breath sounds: No stridor. Wheezing and rhonchi present. No rales.     Comments: Diffusely distant bs More so at bases   Expiratory wheezing worse at bases  Mildly prolonged exp phase   Rhonchi loudest at right base No rales or crackles  Chest:     Chest wall: No tenderness.  Musculoskeletal:     Cervical back: Normal range of motion and neck supple.  Lymphadenopathy:     Cervical: No cervical adenopathy.  Skin:    General: Skin is warm and dry.     Capillary Refill: Capillary refill takes less than 2 seconds.     Findings: No rash.  Neurological:     Mental Status: He is alert.     Cranial Nerves: No cranial nerve deficit.  Psychiatric:        Mood and Affect: Mood normal.           Assessment & Plan:   Problem List Items Addressed This Visit       Respiratory   URI with cough and congestion - Primary   Day 5-6 Cough/congestion and now worsening shortness of breath on exertion  In smoker with likely copd  Distant bs and wheeze on exam  Pulse ox 93% with sitting /speaking full sentences Was worse at home   Stat cxr ordered -noting exac copd/no infiltrate Prescription  Prednisone  40 mg taper Azithromycin Albuterol mdi   Update Friday with progress Call back and Er precautions noted in detail today    Encouraged to consider getting a pulse oximeter for home as well/info given        Relevant Medications   azithromycin  (ZITHROMAX) 250 MG tablet   Other Relevant Orders   DG Chest 2 View (Completed)     Other   SOB (shortness of breath)   Relevant Orders   DG Chest 2 View (Completed)   Smoker   1/2 ppd Last cig this past weekend in setting of illness Suspect copd   Pt is certain that he will not smoke another cigarette and family plans to get them out of the house  Relevant Orders   DG Chest 2 View (Completed)      [1]  Social History Tobacco Use   Smoking status: Every Day    Current packs/day: 0.50    Average packs/day: 0.5 packs/day for 50.0 years (25.0 ttl pk-yrs)    Types: Cigarettes   Smokeless tobacco: Never   Tobacco comments:    1/2 pack a day  Vaping Use   Vaping status: Never Used  Substance Use Topics   Alcohol use: Not Currently    Comment: occ   Drug use: No  [2] No Known Allergies [3]  Current Outpatient Medications on File Prior to Visit  Medication Sig Dispense Refill   atorvastatin  (LIPITOR) 20 MG tablet TAKE 1 TABLET BY MOUTH AT BEDTIME 90 tablet 1   diltiazem  (CARDIZEM  CD) 360 MG 24 hr capsule Take 1 capsule by mouth once daily 90 capsule 1   lisinopril  (ZESTRIL ) 40 MG tablet Take 1 tablet by mouth once daily 90 tablet 1   Multiple Vitamins-Minerals (MENS 50+ MULTI VITAMIN/MIN PO) Take by mouth daily.     No current facility-administered medications on file prior to visit.   "

## 2024-03-13 NOTE — Assessment & Plan Note (Addendum)
 Day 5-6 Cough/congestion and now worsening shortness of breath on exertion  In smoker with likely copd  Distant bs and wheeze on exam  Pulse ox 93% with sitting /speaking full sentences Was worse at home   Stat cxr ordered -noting exac copd/no infiltrate Prescription  Prednisone  40 mg taper Azithromycin Albuterol mdi   Update Friday with progress Call back and Er precautions noted in detail today    Encouraged to consider getting a pulse oximeter for home as well/info given

## 2024-03-13 NOTE — ED Triage Notes (Signed)
 Pt is here with shortness of breath and came from MD office with low 02 sats. States symptoms started right at Christmas. Cough and tightness in chest. Pt is fine just sitting but any exertion he feels like he is in bad shape

## 2024-03-13 NOTE — Patient Instructions (Signed)
 If you can get a pulse oximeter machine - it tells you what your oxygen saturation is  Optimally we want it over 93%   Chest xray today  We will reach out with results and call you with a plan   Watch for increased shortness of breath - if so go to the ER If fever or other new symptoms - please call and let us  know    The over the counter medicines are ok   Stay hydrated also

## 2024-03-14 DIAGNOSIS — J441 Chronic obstructive pulmonary disease with (acute) exacerbation: Secondary | ICD-10-CM | POA: Diagnosis not present

## 2024-03-14 LAB — BASIC METABOLIC PANEL WITH GFR
Anion gap: 11 (ref 5–15)
BUN: 21 mg/dL (ref 8–23)
CO2: 22 mmol/L (ref 22–32)
Calcium: 9.6 mg/dL (ref 8.9–10.3)
Chloride: 104 mmol/L (ref 98–111)
Creatinine, Ser: 1.01 mg/dL (ref 0.61–1.24)
GFR, Estimated: >60 mL/min
Glucose, Bld: 164 mg/dL — ABNORMAL HIGH (ref 70–99)
Potassium: 3.7 mmol/L (ref 3.5–5.1)
Sodium: 137 mmol/L (ref 135–145)

## 2024-03-14 LAB — CBC
HCT: 41.9 % (ref 39.0–52.0)
Hemoglobin: 15.1 g/dL (ref 13.0–17.0)
MCH: 33.1 pg (ref 26.0–34.0)
MCHC: 36 g/dL (ref 30.0–36.0)
MCV: 91.9 fL (ref 80.0–100.0)
Platelets: 117 K/uL — ABNORMAL LOW (ref 150–400)
RBC: 4.56 MIL/uL (ref 4.22–5.81)
RDW: 13.8 % (ref 11.5–15.5)
WBC: 8.7 K/uL (ref 4.0–10.5)
nRBC: 0 % (ref 0.0–0.2)

## 2024-03-14 LAB — PROCALCITONIN: Procalcitonin: <0.1 ng/mL

## 2024-03-14 MED ORDER — AMOXICILLIN-POT CLAVULANATE 875-125 MG PO TABS: 1.0000 | ORAL_TABLET | Freq: Two times a day (BID) | ORAL | 0 refills | Status: AC

## 2024-03-14 MED ORDER — ALBUTEROL SULFATE HFA 108 (90 BASE) MCG/ACT IN AERS: 2.0000 | INHALATION_SPRAY | Freq: Four times a day (QID) | RESPIRATORY_TRACT | 0 refills | Status: AC | PRN

## 2024-03-14 MED ORDER — GUAIFENESIN-DM 100-10 MG/5ML PO SYRP: 5.0000 mL | ORAL_SOLUTION | Freq: Four times a day (QID) | ORAL | Status: DC | PRN

## 2024-03-14 MED ORDER — GUAIFENESIN-DM 100-10 MG/5ML PO SYRP: 5.0000 mL | ORAL_SOLUTION | Freq: Four times a day (QID) | ORAL | 0 refills | Status: AC | PRN

## 2024-03-14 MED ORDER — PREDNISONE 20 MG PO TABS: 40.0000 mg | ORAL_TABLET | Freq: Every day | ORAL | 0 refills | Status: AC

## 2024-03-14 NOTE — Discharge Summary (Signed)
 "  Physician Discharge Summary  William Black:[id number] DOB: 1940-06-08 DOA: 03/13/2024  PCP: William Lynwood HERO, NP  Admit date: 03/13/2024 Discharge date: 03/14/2024  Admitted from: Home Discharge disposition: Home  Recommendations at discharge:  Continue the course of Augmentin  875 mg twice daily, prednisone  40 mg daily for 5 days Mucinex DM PRN for cough, albuterol inhaler as needed for wheezing.  Brief narrative: William Black is a 84 y.o. male with PMH significant for HTN, HLD, chronic smoking, COPD, nephrolithiasis. 12/31, patient was seen at PCPs office for cough, congestion, shortness of breath, generalized malaise for 5 to 6 days.  He was in respiratory distress, oxygen saturation was noted to be low at 91% and hence sent to the ED.  In the ED, patient was afebrile, hemodynamically stable, tachypneic, wheezing.   Initial CBC, BMP unremarkable Respiratory panel positive for RSV Chest x-ray without acute infiltrates Patient was given IV Solu-Medrol, nebulizer Patient continued to remain symptomatic, weak and hence kept in observation to TRH.  Subjective: Patient was seen and examined this morning.  Breathing oligoclonal.  Seen in the ED gurney. Lying on bed.  Feels much better at presentation.  Family was not at bedside. Patient was able to ambulate on the hallway today with nurse and maintain saturation more than 94% on room air. Patient feels good enough for discharge today. Remains afebrile hemodynamically stable, breathing on room air. Labs remain unremarkable  Hospital course: RSV infection  Acute COPD exacerbation Acute respiratory distress Current everyday smoker Presented with flulike symptoms in the setting of pre-existing COPD and continued smoking behavior.   RSV positive. Was in respiratory distress but O2 sat remains above 90% at rest.  Kept in observation for the severity of symptoms.  Started on p.o. Augmentin  875 mg twice daily, prednisone  40 mg  daily, DuoNebs. Feels much better this morning and ready for discharge.  WC count remains normal.  Procalcitonin level not elevated I will continue Augmentin  and prednisone  for 5 days Albuterol inhaler as needed for wheezing Also add Robitussin DM for cough.  Check oxygen requirement on ambulation Counseled to quit smoking.  Nicotine patch offered Recent Labs  Lab 03/13/24 1013 03/13/24 1438 03/14/24 0339  WBC 9.7 9.3 8.7  PROCALCITON  --   --  <0.10   HTN PTA meds- diltiazem  360mg  daily and lisinopril  40mg  daily Continue both.  HLD Continue Lipitor 20 mg nightly  Goals of care   Code Status: Full Code   Diet:  Diet Order             Diet general           Diet regular Room service appropriate? Yes; Fluid consistency: Thin  Diet effective now                   Nutritional status:  Body mass index is 20.36 kg/m.       Wounds:  -    Discharge Medications:   Allergies as of 03/14/2024   No Known Allergies      Medication List     TAKE these medications    albuterol 108 (90 Base) MCG/ACT inhaler Commonly known as: VENTOLIN HFA Inhale 2 puffs into the lungs every 6 (six) hours as needed for wheezing or shortness of breath.   amoxicillin -clavulanate 875-125 MG tablet Commonly known as: AUGMENTIN  Take 1 tablet by mouth every 12 (twelve) hours for 5 days.   atorvastatin  20 MG tablet Commonly known as: LIPITOR TAKE 1 TABLET BY MOUTH AT  BEDTIME   diltiazem  360 MG 24 hr capsule Commonly known as: CARDIZEM  CD Take 1 capsule by mouth once daily What changed: when to take this   guaiFENesin-dextromethorphan 100-10 MG/5ML syrup Commonly known as: ROBITUSSIN DM Take 5 mLs by mouth every 6 (six) hours as needed for cough.   lisinopril  40 MG tablet Commonly known as: ZESTRIL  Take 1 tablet by mouth once daily   MENS 50+ MULTI VITAMIN/MIN PO Take 1 tablet by mouth daily.   predniSONE  20 MG tablet Commonly known as: DELTASONE  Take 2 tablets (40 mg  total) by mouth daily with breakfast for 5 days. Start taking on: March 15, 2024         Follow ups:    Follow-up Information     William Lynwood HERO, NP Follow up.   Specialties: Nurse Practitioner, Family Medicine Contact information: [address] [place] [address] [phone]                 Discharge Instructions:   Discharge Instructions     Call MD for:  difficulty breathing, headache or visual disturbances   Complete by: As directed    Call MD for:  extreme fatigue   Complete by: As directed    Call MD for:  hives   Complete by: As directed    Call MD for:  persistant dizziness or light-headedness   Complete by: As directed    Call MD for:  persistant nausea and vomiting   Complete by: As directed    Call MD for:  severe uncontrolled pain   Complete by: As directed    Call MD for:  temperature >100.4   Complete by: As directed    Diet general   Complete by: As directed    Discharge instructions   Complete by: As directed    Recommendations at discharge:   Continue the course of Augmentin  875 mg twice daily, prednisone  40 mg daily for 5 days  Mucinex DM PRN for cough, albuterol inhaler as needed for wheezing.  General discharge instructions: Follow with Primary MD William Lynwood HERO, NP in 7 days  Please request your PCP  to go over your hospital tests, procedures, radiology results at the follow up. Please get your medicines reviewed and adjusted.  Your PCP may decide to repeat certain labs or tests as needed. Do not drive, operate heavy machinery, perform activities at heights, swimming or participation in water activities or provide baby sitting services if your were admitted for syncope or siezures until you have seen by Primary MD or a Neurologist and advised to do so again. [place]  Controlled Substance Reporting System database was reviewed. Do not drive, operate heavy machinery, perform activities at heights, swim, participate in  water activities or provide baby-sitting services while on medications for pain, sleep and mood until your outpatient physician has reevaluated you and advised to do so again.  You are strongly recommended to comply with the dose, frequency and duration of prescribed medications. Activity: As tolerated with Full fall precautions use walker/cane & assistance as needed Avoid using any recreational substances like cigarette, tobacco, alcohol, or non-prescribed drug. If you experience worsening of your admission symptoms, develop shortness of breath, life threatening emergency, suicidal or homicidal thoughts you must seek medical attention immediately by calling 911 or calling your MD immediately  if symptoms less severe. You must read complete instructions/literature along with all the possible adverse reactions/side effects for all the medicines you take and that have been  prescribed to you. Take any new medicine only after you have completely understood and accepted all the possible adverse reactions/side effects.  Wear Seat belts while driving. You were cared for by a hospitalist during your hospital stay. If you have any questions about your discharge medications or the care you received while you were in the hospital after you are discharged, you can call the unit and ask to speak with the hospitalist or the covering physician. Once you are discharged, your primary care physician will handle any further medical issues. Please note that NO REFILLS for any discharge medications will be authorized once you are discharged, as it is imperative that you return to your primary care physician (or establish a relationship with a primary care physician if you do not have one).   Increase activity slowly   Complete by: As directed        Discharge Exam:   Vitals:   03/13/24 2212 03/14/24 0400 03/14/24 1013 03/14/24 1014  BP: 113/71 105/70 131/78   Pulse: 83 64 94   Resp: 16 16 18    Temp: 97.8 F (36.6 C)  97.7 F (36.5 C)    TempSrc:  Oral    SpO2: 94% 91% 95% 96%  Weight:        Body mass index is 20.36 kg/m.  General exam: Pleasant, elderly Caucasian male.  Not in distress Skin: No rashes, lesions or ulcers. HEENT: Atraumatic, normocephalic, no obvious bleeding Lungs: Mild cough on deep breathing.  Otherwise no crackles, wheezing CVS: S1, S2, no murmur,   GI/Abd: Soft, nontender, nondistended, bowel sound present,   CNS: Alert, awake, oriented x 3 Psychiatry: Mood appropriate Extremities: No pedal edema, no calf tenderness,    The results of significant diagnostics from this hospitalization (including imaging, microbiology, ancillary and laboratory) are listed below for reference.    Procedures and Diagnostic Studies:   DG Chest Port 1 View Result Date: 03/13/2024 EXAM: 1 VIEW(S) XRAY OF THE CHEST 03/13/2024 11:51:24 AM COMPARISON: 03/13/2024 CLINICAL HISTORY: cough FINDINGS: LUNGS AND PLEURA: Lung hyperinflation. No focal pulmonary opacity. No pleural effusion. No pneumothorax. HEART AND MEDIASTINUM: Atherosclerotic plaque is present. The cardiac and mediastinal silhouettes are otherwise without acute abnormality. BONES AND SOFT TISSUES: No acute osseous abnormality. IMPRESSION: 1. Lungs are hyperinflated but clear. 2. Aortic atherosclerotic calcification. Electronically signed by: Waddell Calk MD 03/13/2024 12:58 PM EST RP Workstation: [id number]   DG Chest 2 View Result Date: 03/13/2024 EXAM: 2 VIEW(S) XRAY OF THE CHEST 03/13/2024 08:49:55 AM COMPARISON: 09/25/2023 CLINICAL HISTORY: smoker with cough for a week /now short of breath with some wheezing FINDINGS: LUNGS AND PLEURA: Hyperinflation consistent with COPD. No focal pulmonary opacity. No pleural effusion. No pneumothorax. HEART AND MEDIASTINUM: Aortic atherosclerosis. No acute abnormality of the cardiac silhouette. BONES AND SOFT TISSUES: No acute osseous abnormality. IMPRESSION: 1. Hyperinflation consistent with COPD. 2.  Aortic atherosclerosis. Electronically signed by: Evalene Coho MD 03/13/2024 09:20 AM EST RP Workstation: [id number]     Labs:   Basic Metabolic Panel: Recent Labs  Lab 03/13/24 1013 03/13/24 1438 03/14/24 0339  NA 136  --  137  K 3.6  --  3.7  CL 102  --  104  CO2 20*  --  22  GLUCOSE 113*  --  164*  BUN 10  --  21  CREATININE 0.88 0.92 1.01  CALCIUM  9.4  --  9.6   GFR Estimated Creatinine Clearance: 53.7 mL/min (by C-G formula based on SCr of 1.01 mg/dL). Liver Function Tests:  No results for input(s): AST, ALT, ALKPHOS, BILITOT, PROT, ALBUMIN in the last 168 hours. No results for input(s): LIPASE, AMYLASE in the last 168 hours. No results for input(s): AMMONIA in the last 168 hours. Coagulation profile No results for input(s): INR, PROTIME in the last 168 hours.  CBC: Recent Labs  Lab 03/13/24 1013 03/13/24 1438 03/14/24 0339  WBC 9.7 9.3 8.7  HGB 17.5* 17.1* 15.1  HCT 48.3 49.2 41.9  MCV 93.1 95.3 91.9  PLT 125* 128* 117*   Cardiac Enzymes: No results for input(s): CKTOTAL, CKMB, CKMBINDEX, TROPONINI in the last 168 hours. BNP: Invalid input(s): POCBNP CBG: No results for input(s): GLUCAP in the last 168 hours. D-Dimer No results for input(s): DDIMER in the last 72 hours. Hgb A1c No results for input(s): HGBA1C in the last 72 hours. Lipid Profile No results for input(s): CHOL, HDL, LDLCALC, TRIG, CHOLHDL, LDLDIRECT in the last 72 hours. Thyroid  function studies No results for input(s): TSH, T4TOTAL, T3FREE, THYROIDAB in the last 72 hours.  Invalid input(s): FREET3 Anemia work up No results for input(s): VITAMINB12, FOLATE, FERRITIN, TIBC, IRON, RETICCTPCT in the last 72 hours. Microbiology Recent Results (from the past 240 hours)  Resp panel by RT-PCR (RSV, Flu A&B, Covid) Anterior Nasal Swab     Status: Abnormal   Collection Time: 03/13/24 10:08 AM   Specimen: Anterior Nasal  Swab  Result Value Ref Range Status   SARS Coronavirus 2 by RT PCR NEGATIVE NEGATIVE Final   Influenza A by PCR NEGATIVE NEGATIVE Final   Influenza B by PCR NEGATIVE NEGATIVE Final    Comment: (NOTE) The Xpert Xpress SARS-CoV-2/FLU/RSV plus assay is intended as an aid in the diagnosis of influenza from Nasopharyngeal swab specimens and should not be used as a sole basis for treatment. Nasal washings and aspirates are unacceptable for Xpert Xpress SARS-CoV-2/FLU/RSV testing.  Fact Sheet for Patients: bloggercourse.com  Fact Sheet for Healthcare Providers: seriousbroker.it  This test is not yet approved or cleared by the [place]  FDA and has been authorized for detection and/or diagnosis of SARS-CoV-2 by FDA under an Emergency Use Authorization (EUA). This EUA will remain in effect (meaning this test can be used) for the duration of the COVID-19 declaration under Section 564(b)(1) of the Act, 21 U.S.C. section 360bbb-3(b)(1), unless the authorization is terminated or revoked.     Resp Syncytial Virus by PCR POSITIVE (A) NEGATIVE Final    Comment: (NOTE) Fact Sheet for Patients: bloggercourse.com  Fact Sheet for Healthcare Providers: seriousbroker.it  This test is not yet approved or cleared by the [place]  FDA and has been authorized for detection and/or diagnosis of SARS-CoV-2 by FDA under an Emergency Use Authorization (EUA). This EUA will remain in effect (meaning this test can be used) for the duration of the COVID-19 declaration under Section 564(b)(1) of the Act, 21 U.S.C. section 360bbb-3(b)(1), unless the authorization is terminated or revoked.  Performed at [facility], [address] [place], [place] [address]     Time coordinating discharge: 45 minutes  Signed: Graesyn Schreifels  [facility] 03/14/2024, 11:10 AM  "

## 2024-03-14 NOTE — Care Management Obs Status (Signed)
 MEDICARE OBSERVATION STATUS NOTIFICATION   Patient Details  Name: Tymir Finerty MRN: [id number] Date of Birth: 1940/11/26   Medicare Observation Status Notification Given:  Yes    Camelia JONETTA Cary, RN 03/14/2024, 11:08 AM

## 2024-03-14 NOTE — Progress Notes (Signed)
" °   03/14/24 1122  TOC ED Mini Assessment  TOC Time spent with patient (minutes): 20  Admission or Readmission Diverted Yes  Means of departure Car  Choice offered to / list presented to  NA    MOON letter signed and given.   Transition of Care [facility]) - Emergency Department Mini Assessment   Patient Details  Name: William Black MRN: [id number] Date of Birth: 22-Oct-1940  Transition of Care [facility]) CM/SW Contact:    Camelia JONETTA Cary, RN Phone Number: 03/14/2024, 11:23 AM   Clinical Narrative:  RNCM spoke to patient regarding d/c. Pt. Will return home, spouse will transport. Has cane. No additional needs at this time. Will follow up.    ED Mini Assessment:          Means of departure: (P) Car       Patient Contact and Communications        ,              Choice offered to / list presented to : (P) NA  Admission diagnosis:  COPD with acute exacerbation (HCC) [J44.1] Patient Active Problem List   Diagnosis Date Noted   Smoker 03/13/2024   SOB (shortness of breath) 03/13/2024   COPD with acute exacerbation (HCC) 03/13/2024   Recurrent headache 10/31/2023   URI with cough and congestion 09/25/2023   Hospital discharge follow-up 07/11/2023   Irregular heartbeat 06/01/2023   HTN (hypertension) 10/30/2018   Hyperlipemia 10/30/2018   Chronic calculous cholecystitis 01/28/2018   PCP:  [name], NP Pharmacy:   [facility] [id number] [place] - [address] [place] [address] Phone: [phone] Fax: [phone]   "

## 2024-03-21 ENCOUNTER — Ambulatory Visit (INDEPENDENT_AMBULATORY_CARE_PROVIDER_SITE_OTHER): Admitting: Nurse Practitioner

## 2024-03-21 VITALS — BP 132/78 | HR 77 | Temp 98.4°F | Ht 71.0 in | Wt 146.8 lb

## 2024-03-21 DIAGNOSIS — Z09 Encounter for follow-up examination after completed treatment for conditions other than malignant neoplasm: Secondary | ICD-10-CM | POA: Diagnosis not present

## 2024-03-21 NOTE — Progress Notes (Signed)
 "  Established Patient Office Visit  Subjective   Patient ID: William Black, male    DOB: Jul 25, 1940  Age: 84 y.o. MRN: [id number]  Chief Complaint  Patient presents with   Hospitalization Follow-up    Pt complains of feeling a little better but complains of still having mucus in chest. Pt still has cough.     HPI  Patient was seen in the office on 03/13/2024 diagnosed with URI day 5-6.  Patient was started on prednisone  40 mg taper, azithromycin , albuterol  MDI.  Patient end up going to the emergency department on 03/13/2024.  His RSV swab came back positive.  Patient was consulted for admission.  Patient was discharged on 03/14/2024 to continue a course of Augmentin  875 twice daily along with prednisone  40 mg daily for 5 days.  Patient is here for follow-up  Discussed the use of AI scribe software for clinical note transcription with the patient, who gave verbal consent to proceed.  History of Present Illness William Black is an 84 year old male who presents with persistent cough and mucus production following a recent RSV infection.   He was recently hospitalized for RSV on December 31st and treated with prednisone , azithromycin , and an inhaler due to low oxygen levels. He was observed overnight and discharged with additional antibiotics and prednisone  for five days.  He experiences persistent phlegm that he is unable to fully expectorate. He feels better when lying down but experiences a 'blase' feeling upon getting up. No current fever or chills, but he initially had chills. No sore throat or ear pain, but he describes a heavy and tight feeling in his chest due to frequent coughing. He experiences significant nasal drainage, described as a 'waterfall'.  He is taking Robitussin every six hours, which contains guaifenesin , to help with mucus. He drinks approximately four bottles of fluids a day. He uses an albuterol  inhaler as needed but reports it makes his tongue feel weird and does  not notice significant improvement in symptoms.  He feels 'dragged out' with low energy and fatigue. He experienced a lack of appetite for two weeks but now eats well. He reports frequent bowel movements, approximately twenty times a day, which he attributes to recent antibiotic use. He describes the stool as formed, not liquid.  He recalls a recent episode of tendinitis before Christmas, which required the use of a boot. He also mentions a significant decline in his condition around Christmas, leading to hospitalization.  He has received vaccinations for RSV, shingles, flu, and pneumonia, but has not received the COVID vaccine. He enjoys outdoor activities such as yard work and riding his motorcycle, but has been unable to engage in these activities due to his current condition.    Review of Systems  Constitutional:  Positive for malaise/fatigue. Negative for chills and fever.  Respiratory:  Positive for cough and sputum production. Negative for shortness of breath.   Cardiovascular:  Negative for chest pain.  Gastrointestinal:  Negative for constipation and diarrhea.  Neurological:  Negative for headaches.  Psychiatric/Behavioral:  Negative for hallucinations and suicidal ideas.       Objective:     BP 132/78   Pulse 77   Temp 98.4 F (36.9 C) (Oral)   Ht 5' 11 (1.803 m)   Wt 146 lb 12.8 oz (66.6 kg)   SpO2 92%   BMI 20.47 kg/m  BP Readings from Last 3 Encounters:  03/21/24 132/78  03/14/24 131/78  03/13/24 110/68   Wt Readings from Last  3 Encounters:  03/21/24 146 lb 12.8 oz (66.6 kg)  03/13/24 145 lb 15.1 oz (66.2 kg)  02/01/24 146 lb (66.2 kg)   SpO2 Readings from Last 3 Encounters:  03/21/24 92%  03/14/24 96%  03/13/24 93%      Physical Exam Vitals and nursing note reviewed.  Constitutional:      Appearance: Normal appearance.  HENT:     Right Ear: Tympanic membrane, ear canal and external ear normal.     Left Ear: Ear canal and external ear normal.  There is impacted cerumen.     Mouth/Throat:     Mouth: Mucous membranes are moist.     Pharynx: Posterior oropharyngeal erythema present.  Cardiovascular:     Rate and Rhythm: Normal rate and regular rhythm.     Heart sounds: Normal heart sounds.  Pulmonary:     Effort: Pulmonary effort is normal.     Breath sounds: Normal breath sounds.  Lymphadenopathy:     Cervical: No cervical adenopathy.  Neurological:     Mental Status: He is alert.      No results found for any visits on 03/21/24.    The ASCVD Risk score (Arnett DK, et al., 2019) failed to calculate for the following reasons:   The 2019 ASCVD risk score is only valid for ages 84 to 43   * - Cholesterol units were assumed    Assessment & Plan:   Problem List Items Addressed This Visit       Other   Hospital discharge follow-up - Primary   Assessment and Plan Assessment & Plan RSV infection with persistent cough RSV confirmed. Persistent cough with phlegm, difficulty expectorating, and fatigue. No fever or chills. Discussed pneumonia risk and vaccination importance. - Continue albuterol  inhaler as needed every 4-6 hours. - Encouraged increased fluid intake and physical activity. - Scheduled follow-up in 10-14 days. - Reviewed most recent office visit along with ED note and inpatient discharge note along with most recent labs   postnasal drip  postnasal drip contributing to cough. Flonase prescribed. Discussed epistaxis risk. - Continue Flonase nasal spray, two sprays in each nostril once daily for one week. - Monitor for epistaxis.   General Health Maintenance Up to date on RSV, shingles, flu, and pneumonia vaccinations. COVID vaccination discussed but declined. - Consider COVID vaccination in the fall if desired.   Return in about 10 days (around 03/31/2024) for RSV recheck .    Adina Crandall, NP  "

## 2024-03-21 NOTE — Patient Instructions (Signed)
 Nice to see you today You can use the Flonase (fluticasone) 2 sprays each nostril once a day Continue to drink as you are Eat as you feel like Follow up with me in 10-14 days, sooner if you need me

## 2024-04-03 ENCOUNTER — Ambulatory Visit: Admitting: Podiatry

## 2024-04-03 ENCOUNTER — Ambulatory Visit: Admitting: Nurse Practitioner

## 2024-04-03 DIAGNOSIS — M7672 Peroneal tendinitis, left leg: Secondary | ICD-10-CM | POA: Diagnosis not present

## 2024-04-03 DIAGNOSIS — Q666 Other congenital valgus deformities of feet: Secondary | ICD-10-CM

## 2024-04-03 NOTE — Progress Notes (Signed)
 "  Subjective:  Patient ID: William Black, male    DOB: [birth year],  MRN: [id number]  Chief Complaint  Patient presents with   Foot Pain    Follow up for left peroneal tendinitis     84 y.o. male presents with the above complaint.  Patient presents for follow-up on left peroneal tendinitis he states is doing little bit better.  He was hospitalized recently.  He has not been using the boot as frequently as he would like.  Yeah but his pain doing little bit better still has some residual pain would like to discuss next treatment plan.   Review of Systems: Negative except as noted in the HPI. Denies N/V/F/Ch.  Past Medical History:  Diagnosis Date   Deafness in right ear    High cholesterol    Hyperlipemia 10/30/2018   Hypertension    Kidney stones    Kidney stones    Current Medications[1]  Tobacco Use History[2]  Allergies[3] Objective:  There were no vitals filed for this visit. There is no height or weight on file to calculate BMI. Constitutional Well developed. Well nourished.  Vascular Dorsalis pedis pulses palpable bilaterally. Posterior tibial pulses palpable bilaterally. Capillary refill normal to all digits.  No cyanosis or clubbing noted. Pedal hair growth normal.  Neurologic Normal speech. Oriented to person, place, and time. Epicritic sensation to light touch grossly present bilaterally.  Dermatologic Nails well groomed and normal in appearance. No open wounds. No skin lesions.  Orthopedic: Pain along the course of the peroneal tendon no pain at the insertion pain with dorsiflexion eversion of the foot.  No pain at the Achilles tendon ATFL ligament posterior tibial tendon   Radiographs: None Assessment:   1. Peroneal tendinitis of left lower extremity   2. Pes planovalgus     Plan:  Patient was evaluated and treated and all questions answered.  Left peroneal tendinitis - All questions and concerns were discussed with the patient in extensive detail  clinically cam boot immobilization helped him a little bit.  He still has a residual pain.  At this time patient would benefit from steroid injection discussed the risk of rupture associate with that he states understand like to proceed despite the risk -A steroid injection was performed at left lateral foot at point of maximal tenderness using 1% plain Lidocaine  and 10 mg of Kenalog. This was well tolerated. - Shoe gear modification discussed orthotics management discussed - Tri-Lock ankle brace was dispensed  Pes planovalgus/foot deformity -I explained to patient the etiology of pes planovalgus and relationship with heel pain/arch pain and various treatment options were discussed.  Given patient foot structure in the setting of heel pain/arch pain I believe patient will benefit from custom-made orthotics to help control the hindfoot motion support the arch of the foot and take the stress away from arches.  Patient agrees with the plan like to proceed with orthotics -Patient was casted for orthotics   No follow-ups on file.      [1]  Current Outpatient Medications:    albuterol  (VENTOLIN  HFA) 108 (90 Base) MCG/ACT inhaler, Inhale 2 puffs into the lungs every 6 (six) hours as needed for wheezing or shortness of breath., Disp: 6.7 g, Rfl: 0   atorvastatin  (LIPITOR) 20 MG tablet, TAKE 1 TABLET BY MOUTH AT BEDTIME, Disp: 90 tablet, Rfl: 1   diltiazem  (CARDIZEM  CD) 360 MG 24 hr capsule, Take 1 capsule by mouth once daily (Patient taking differently: Take 360 mg by mouth at bedtime.), Disp:  90 capsule, Rfl: 1   guaiFENesin -dextromethorphan (ROBITUSSIN DM) 100-10 MG/5ML syrup, Take 5 mLs by mouth every 6 (six) hours as needed for cough., Disp: 118 mL, Rfl: 0   lisinopril  (ZESTRIL ) 40 MG tablet, Take 1 tablet by mouth once daily, Disp: 90 tablet, Rfl: 1   Multiple Vitamins-Minerals (MENS 50+ MULTI VITAMIN/MIN PO), Take 1 tablet by mouth daily., Disp: , Rfl:  [2]  Social History Tobacco Use  Smoking  Status Every Day   Current packs/day: 0.50   Average packs/day: 0.5 packs/day for 50.0 years (25.0 ttl pk-yrs)   Types: Cigarettes  Smokeless Tobacco Never  Tobacco Comments   1/2 pack a day  [3] No Known Allergies  "

## 2024-04-05 ENCOUNTER — Ambulatory Visit: Payer: Self-pay | Admitting: Nurse Practitioner

## 2024-04-05 VITALS — BP 132/84 | HR 66 | Temp 97.8°F | Ht 71.0 in | Wt 147.6 lb

## 2024-04-05 DIAGNOSIS — R0602 Shortness of breath: Secondary | ICD-10-CM | POA: Diagnosis not present

## 2024-04-05 DIAGNOSIS — R052 Subacute cough: Secondary | ICD-10-CM | POA: Diagnosis not present

## 2024-04-05 NOTE — Patient Instructions (Signed)
 Nice to see you today  You can do plain mucinex  over the next week Continue staying hydrated Follow up with me as scheduled

## 2024-04-05 NOTE — Progress Notes (Signed)
 "  Established Patient Office Visit  Subjective   Patient ID: William Black, male    DOB: 1940/05/23  Age: 84 y.o. MRN: [id number]  Chief Complaint  Patient presents with   Follow-up    Pt complains of feeling a lot better but states of lingering cough and mucus.     HPI  Discussed the use of AI scribe software for clinical note transcription with the patient, who gave verbal consent to proceed.  History of Present Illness William Black is an 84 year old male with COPD who presents with a persistent mucus cough.  He has a persistent mucus cough described as 'like an elevator,' moving up and down but not enough to expel. The cough has been present since his recent hospitalization and has improved slightly according to [facility], who was present during the visit. Occasional shortness of breath occurs, particularly with overexertion, but resolves with rest. He has not used his albuterol  inhaler recently.  He has a history of COPD, diagnosed by his cardiologist. He quit smoking a month ago, which he believes has contributed to some improvement in his symptoms. No fever has been experienced during this illness.  He reports a good appetite, stating he is 'starving' and drinks three to four bottles of water daily to help thin the mucus. He has been gradually resuming normal activities around the house, including vacuuming and preparing for a storm, which he describes as the most activity he has done since Christmas.  During a recent hospital stay, he underwent multiple diagnostic tests, including x-rays and blood draws, to monitor for infection and kidney function. He received a shot in the abdomen to prevent blood clots and was placed in the psych ward due to hospital capacity issues, which he found humorous.     Review of Systems  Constitutional:  Negative for chills and fever.  Respiratory:  Positive for cough, sputum production and shortness of breath (improving).   Cardiovascular:   Negative for chest pain.      Objective:     BP 132/84   Pulse 66   Temp 97.8 F (36.6 C) (Oral)   Ht 5' 11 (1.803 m)   Wt 147 lb 9.6 oz (67 kg)   SpO2 98%   BMI 20.59 kg/m  BP Readings from Last 3 Encounters:  04/05/24 132/84  03/21/24 132/78  03/14/24 131/78   Wt Readings from Last 3 Encounters:  04/05/24 147 lb 9.6 oz (67 kg)  03/21/24 146 lb 12.8 oz (66.6 kg)  03/13/24 145 lb 15.1 oz (66.2 kg)   SpO2 Readings from Last 3 Encounters:  04/05/24 98%  03/21/24 92%  03/14/24 96%      Physical Exam Vitals and nursing note reviewed.  Constitutional:      Appearance: Normal appearance.  Cardiovascular:     Rate and Rhythm: Normal rate and regular rhythm.     Heart sounds: Normal heart sounds.  Pulmonary:     Effort: Pulmonary effort is normal.     Breath sounds: Normal breath sounds.  Neurological:     Mental Status: He is alert.      No results found for any visits on 04/05/24.    The ASCVD Risk score (Arnett DK, et al., 2019) failed to calculate for the following reasons:   The 2019 ASCVD risk score is only valid for ages 45 to 50   * - Cholesterol units were assumed    Assessment & Plan:   Problem List Items Addressed This Visit  Other   SOB (shortness of breath) - Primary   Subacute cough   Assessment and Plan Assessment & Plan Post-viral cough with mucus Persistent cough with mucus likely due to recent RSV infection. No fever, indicating resolution of infection. Cough may last up to six weeks. Smoking cessation may increase mucus production as lungs recover. - Start Mucinex  twice daily for one week. - Continue adequate fluid intake.  Chronic obstructive pulmonary disease (COPD) Likely COPD confirmed by cardiologist. Smoking cessation is crucial. No current need for daily inhaler; albuterol  use is infrequent. Discussed potential future use of Symbicort or Advair, considering cost. - Continue smoking cessation. - Monitor symptoms and  use albuterol  as needed. - Consider daily inhaler if symptoms worsen.  Postnasal drip Symptoms improved with Flonase. - Continue Flonase nasal spray once daily.  Return if symptoms worsen or fail to improve, for As scheduled.    Adina Crandall, NP  "

## 2024-05-01 ENCOUNTER — Ambulatory Visit: Admitting: Podiatry

## 2024-05-31 ENCOUNTER — Encounter: Admitting: Nurse Practitioner

## 2024-08-01 ENCOUNTER — Ambulatory Visit: Admitting: Neurology

## 2024-11-19 ENCOUNTER — Ambulatory Visit
